# Patient Record
Sex: Female | Born: 1955
Health system: Southern US, Community
[De-identification: ages and names within clinical notes are randomized; demographics above are authoritative.]

## PROBLEM LIST (undated history)

## (undated) DIAGNOSIS — E669 Obesity, unspecified: Secondary | ICD-10-CM

## (undated) DIAGNOSIS — F319 Bipolar disorder, unspecified: Secondary | ICD-10-CM

## (undated) DIAGNOSIS — G8921 Chronic pain due to trauma: Secondary | ICD-10-CM

## (undated) HISTORY — DX: Obesity, unspecified: E66.9

## (undated) HISTORY — DX: Bipolar disorder, unspecified: F31.9

## (undated) HISTORY — DX: Chronic pain due to trauma: G89.21

---

## 2001-02-14 ENCOUNTER — Encounter: Admission: RE | Admit: 2001-02-14 | Discharge: 2001-02-14 | Payer: Self-pay | Admitting: Family Medicine

## 2004-12-23 ENCOUNTER — Emergency Department (HOSPITAL_COMMUNITY): Admission: EM | Admit: 2004-12-23 | Discharge: 2004-12-23 | Payer: Self-pay | Admitting: Emergency Medicine

## 2005-04-11 ENCOUNTER — Encounter (INDEPENDENT_AMBULATORY_CARE_PROVIDER_SITE_OTHER): Payer: Self-pay | Admitting: *Deleted

## 2005-04-11 LAB — CONVERTED CEMR LAB

## 2005-05-06 ENCOUNTER — Ambulatory Visit: Payer: Self-pay | Admitting: Family Medicine

## 2005-07-05 ENCOUNTER — Encounter: Admission: RE | Admit: 2005-07-05 | Discharge: 2005-07-05 | Payer: Self-pay

## 2005-08-23 ENCOUNTER — Emergency Department (HOSPITAL_COMMUNITY): Admission: EM | Admit: 2005-08-23 | Discharge: 2005-08-23 | Payer: Self-pay | Admitting: Emergency Medicine

## 2007-02-08 DIAGNOSIS — F319 Bipolar disorder, unspecified: Secondary | ICD-10-CM | POA: Insufficient documentation

## 2007-02-09 ENCOUNTER — Encounter (INDEPENDENT_AMBULATORY_CARE_PROVIDER_SITE_OTHER): Payer: Self-pay | Admitting: *Deleted

## 2007-05-22 ENCOUNTER — Ambulatory Visit: Payer: Self-pay | Admitting: Family Medicine

## 2007-05-22 ENCOUNTER — Encounter (INDEPENDENT_AMBULATORY_CARE_PROVIDER_SITE_OTHER): Payer: Self-pay | Admitting: Family Medicine

## 2007-05-22 DIAGNOSIS — E669 Obesity, unspecified: Secondary | ICD-10-CM | POA: Insufficient documentation

## 2007-05-22 LAB — CONVERTED CEMR LAB

## 2007-05-28 ENCOUNTER — Encounter (INDEPENDENT_AMBULATORY_CARE_PROVIDER_SITE_OTHER): Payer: Self-pay | Admitting: Family Medicine

## 2007-06-20 ENCOUNTER — Ambulatory Visit: Payer: Self-pay | Admitting: Family Medicine

## 2007-06-20 ENCOUNTER — Encounter (INDEPENDENT_AMBULATORY_CARE_PROVIDER_SITE_OTHER): Payer: Self-pay | Admitting: Family Medicine

## 2007-06-20 LAB — CONVERTED CEMR LAB
ALT: 18 units/L (ref 0–35)
AST: 16 units/L (ref 0–37)
Albumin: 4 g/dL (ref 3.5–5.2)
BUN: 12 mg/dL (ref 6–23)
CO2: 24 meq/L (ref 19–32)
Calcium: 9.1 mg/dL (ref 8.4–10.5)
Chloride: 109 meq/L (ref 96–112)
Cholesterol: 167 mg/dL (ref 0–200)
Potassium: 4.4 meq/L (ref 3.5–5.3)

## 2007-06-21 ENCOUNTER — Encounter (INDEPENDENT_AMBULATORY_CARE_PROVIDER_SITE_OTHER): Payer: Self-pay | Admitting: Family Medicine

## 2007-07-02 ENCOUNTER — Ambulatory Visit: Payer: Self-pay | Admitting: Family Medicine

## 2007-07-02 ENCOUNTER — Telehealth (INDEPENDENT_AMBULATORY_CARE_PROVIDER_SITE_OTHER): Payer: Self-pay | Admitting: *Deleted

## 2007-07-02 DIAGNOSIS — H6120 Impacted cerumen, unspecified ear: Secondary | ICD-10-CM

## 2008-09-15 ENCOUNTER — Encounter (INDEPENDENT_AMBULATORY_CARE_PROVIDER_SITE_OTHER): Payer: Self-pay | Admitting: Family Medicine

## 2009-11-26 ENCOUNTER — Ambulatory Visit: Payer: Self-pay | Admitting: Family Medicine

## 2009-11-26 ENCOUNTER — Encounter: Payer: Self-pay | Admitting: Family Medicine

## 2009-11-26 DIAGNOSIS — R5383 Other fatigue: Secondary | ICD-10-CM

## 2009-11-26 DIAGNOSIS — R5381 Other malaise: Secondary | ICD-10-CM | POA: Insufficient documentation

## 2009-11-26 DIAGNOSIS — G8921 Chronic pain due to trauma: Secondary | ICD-10-CM | POA: Insufficient documentation

## 2009-11-27 ENCOUNTER — Telehealth: Payer: Self-pay | Admitting: *Deleted

## 2009-11-27 LAB — CONVERTED CEMR LAB
BUN: 16 mg/dL (ref 6–23)
CO2: 18 meq/L — ABNORMAL LOW (ref 19–32)
Creatinine, Ser: 0.79 mg/dL (ref 0.40–1.20)
Glucose, Bld: 105 mg/dL — ABNORMAL HIGH (ref 70–99)
HCT: 39.1 % (ref 36.0–46.0)
Hemoglobin: 12.7 g/dL (ref 12.0–15.0)
MCV: 84.3 fL (ref 78.0–100.0)
RBC: 4.64 M/uL (ref 3.87–5.11)
TSH: 1.405 microintl units/mL (ref 0.350–4.500)
Total Bilirubin: 0.4 mg/dL (ref 0.3–1.2)
Total Protein: 7.4 g/dL (ref 6.0–8.3)
Vit D, 25-Hydroxy: 13 ng/mL — ABNORMAL LOW (ref 30–89)
WBC: 9.2 10*3/uL (ref 4.0–10.5)

## 2009-12-07 ENCOUNTER — Encounter: Payer: Self-pay | Admitting: Family Medicine

## 2009-12-14 ENCOUNTER — Encounter: Payer: Self-pay | Admitting: Family Medicine

## 2009-12-14 ENCOUNTER — Ambulatory Visit: Payer: Self-pay | Admitting: Family Medicine

## 2009-12-14 LAB — CONVERTED CEMR LAB
Cholesterol: 176 mg/dL (ref 0–200)
HDL: 52 mg/dL (ref >39)
Triglycerides: 57 mg/dL (ref <150)

## 2009-12-17 ENCOUNTER — Encounter: Payer: Self-pay | Admitting: Family Medicine

## 2010-02-20 ENCOUNTER — Emergency Department (HOSPITAL_COMMUNITY): Admission: EM | Admit: 2010-02-20 | Discharge: 2010-02-20 | Payer: Self-pay | Admitting: Emergency Medicine

## 2011-01-11 NOTE — Letter (Signed)
Summary: Lipid Letter  [facility]  [address]   [place], [place] [address]   Phone: [phone]  Fax: [phone]    12/17/2009  Wendy Ruiz [address] [place], [place]  [address]  Dear [name]:  We have carefully reviewed your last lipid profile from 12/14/2009 and the results are noted below with a summary of recommendations for lipid management.    Cholesterol:       176     Goal: < 200     HDL "good" Cholesterol:   52     Goal: > 40   LDL "bad" Cholesterol:   113     Goal: < 100   Triglycerides:       57     Goal: < 150  ** The only number that is not ideal is the LDL. You could either work on your diet and exercise to improve this or we could start a medicine. I am fine either way. Give me a call at [phone] to let me know what you think.     TLC Diet (Therapeutic Lifestyle Change): Saturated Fats & Transfatty acids should be kept < 7% of total calories ***Reduce Saturated Fats Polyunstaurated Fat can be up to 10% of total calories Monounsaturated Fat Fat can be up to 20% of total calories Total Fat should be no greater than 25-35% of total calories Carbohydrates should be 50-60% of total calories Protein should be approximately 15% of total calories Fiber should be at least 20-30 grams a day ***Increased fiber may help lower LDL Total Cholesterol should be < 200mg /day Consider adding plant stanol/sterols to diet (example: Benacol spread) ***A higher intake of unsaturated fat may reduce Triglycerides and Increase HDL    Adjunctive Measures (may lower LIPIDS and reduce risk of Heart Attack) include: Aerobic Exercise (20-30 minutes 3-4 times a week) Limit Alcohol Consumption Weight Reduction Aspirin 75-81 mg a day by mouth (if not allergic or contraindicated) Dietary Fiber 20-30 grams a day by mouth     Current Medications: 1)    Tramadol Hcl 50 Mg Tabs (Tramadol hcl) .Marland Kitchen.. 1-2 by mouth every 6 hours as needed for pain 2)    Vitamin D  (ergocalciferol) 50000 Unit Caps (Ergocalciferol) .... One pill once a week for 8 weeks  If you have any questions, please call. We appreciate being able to work with you.   Sincerely,    [facility] Myrtie Soman  MD  Appended Document: Lipid Letter mailed.

## 2011-01-25 ENCOUNTER — Encounter: Payer: Self-pay | Admitting: *Deleted

## 2011-06-03 ENCOUNTER — Ambulatory Visit (INDEPENDENT_AMBULATORY_CARE_PROVIDER_SITE_OTHER): Payer: Medicare Other | Admitting: Family Medicine

## 2011-06-03 VITALS — BP 116/83 | HR 73 | Temp 98.2°F | Ht 65.0 in | Wt 229.0 lb

## 2011-06-03 DIAGNOSIS — L738 Other specified follicular disorders: Secondary | ICD-10-CM

## 2011-06-03 DIAGNOSIS — L739 Follicular disorder, unspecified: Secondary | ICD-10-CM | POA: Insufficient documentation

## 2011-06-03 DIAGNOSIS — L678 Other hair color and hair shaft abnormalities: Secondary | ICD-10-CM

## 2011-06-03 MED ORDER — MUPIROCIN CALCIUM 2 % EX CREA: TOPICAL_CREAM | CUTANEOUS | Status: DC

## 2011-06-03 NOTE — Patient Instructions (Signed)
Folliculitis  (Inflammation of Hair Follicle) Folliculitis is an infection and inflammation of the hair follicles. Hair follicles become red and irritated. This inflammation is usually caused by bacteria. The bacteria thrive in warm, moist environments. This condition can be seen anywhere on the body.  CAUSES AND RISK FACTORS The most common cause of folliculitis is an infection by germs (bacteria). Fungal and viral infections can also cause the condition. Viral infections may be more common in people whose bodies are unable to fight disease well (weakened immune systems). Examples include people with:  AIDS.   An organ transplant.   Cancer.  People with depressed immune systems, diabetes, or obesity, have a greater risk of getting folliculitis than the general population. Certain chemicals, especially oils and tars, also can cause folliculitis. SYMPTOMS  An early sign of folliculitis is a small, white or yellow pus-filled, itchy lesion (pustule). These lesions appear on a red, inflamed follicle. They are usually less than 5 mm (.20 inches).   The most likely starting points are the scalp, thighs, legs, back and buttocks. Folliculitis is also frequently found in areas of repeated shaving.   When an infection of the follicle goes deeper, it becomes a boil or furuncle. A group of closely packed boils create a larger lesion (a carbuncle). These sores (lesions) tend to occur in hairy, sweaty areas of the body.  TREATMENT  A doctor who specializes in skin problems (dermatologists) treats mild cases of folliculitis with antiseptic washes.   They also use a skin application which kills germs (topical antibiotics). Tea tree oil is a good topical antiseptic as well. It can be found at a health food store. A small percentage of individuals may develop an allergy to the tea tree oil.   Mild to moderate boils respond well to warm water compresses applied three times daily.   In some cases, oral  antibiotics should be taken with the skin treatment.   If lesions contain large quantities of pus or fluid, your caregiver may drain them. This allows the topical antibiotics to get to the affected areas better.   Stubborn cases of folliculitis may respond to laser hair removal. This process uses a high intensity light beam (a laser) to destroy the follicle and reduces the scarring from folliculitis. After laser hair removal, hair will no longer grow in the laser treated area.  Patients with long-lasting folliculitis need to find out where the infection is coming from. Germs can live in the nostrils of the patient. This can trigger an outbreak now and then. Sometimes the bacteria live in the nostrils of a family member. This person does not develop the disorder but they repeatedly re-expose others to the germ. To break the cycle of recurrence in the patient, the family member must also undergo treatment. PREVENTION  Individuals who are predisposed to folliculitis should be extremely careful about personal hygiene.   Application of antiseptic washes may help prevent recurrences.   A topical antibiotic cream, mupirocin (Bactroban), has been effective at reducing bacteria in the nostrils. It is applied inside the nose with your little finger. This is done twice daily for a week. Then it is repeated every 6 months.   Because follicle disorders tend to come back, patients must receive follow-up care. Your caregiver may be able to recognize a recurrence before it becomes severe.  SEEK IMMEDIATE MEDICAL CARE IF:  You develop redness, swelling, or increasing pain in the area.   An unexplained oral temperature above 100.4 develops.  You are not improving with treatment or are getting worse.   You have any other questions or concerns.  Document Released: 02/06/2002 Document Re-Released: 02/22/2010 [facility] Patient Information 2011 [place], [place].

## 2011-06-06 ENCOUNTER — Encounter: Payer: Self-pay | Admitting: Family Medicine

## 2011-06-06 NOTE — Progress Notes (Signed)
  Subjective:    Patient ID: Wendy Ruiz, female    DOB: 09/22/1956, 55 y.o.   MRN: [id number]  HPI  1. "Bump:" On head, x several days, thought that she may have lice last week - used OTC treatment x 2. Hx dry scalp. Now, feels sore bump on crown/top of head. No drainage. No fever/chills, N/V/D, other sores/rash. No change in hair products or regimen otherwise. Never happened before.  Review of Systems SEE HPI.    Objective:   Physical Exam  Vitals reviewed. Constitutional: She appears well-developed and well-nourished. No distress.  HENT:       < 1 cm diameter pustular lesion though seems to have ruptured already, no signs of infection, no warmth or drainage. Otherwise, scalp generally dry.      Assessment & Plan:

## 2011-06-06 NOTE — Assessment & Plan Note (Signed)
Rx Mupirocin to area. Advised to limit hair regimen to sensitive products. Precautions given.

## 2011-07-03 ENCOUNTER — Emergency Department (HOSPITAL_COMMUNITY)
Admission: EM | Admit: 2011-07-03 | Discharge: 2011-07-03 | Discharge status: Against Medical Advice | Payer: Medicare Other | Attending: Emergency Medicine | Admitting: Emergency Medicine

## 2011-07-04 ENCOUNTER — Ambulatory Visit (INDEPENDENT_AMBULATORY_CARE_PROVIDER_SITE_OTHER): Payer: Medicare Other | Admitting: Family Medicine

## 2011-07-04 DIAGNOSIS — L738 Other specified follicular disorders: Secondary | ICD-10-CM

## 2011-07-04 DIAGNOSIS — L739 Follicular disorder, unspecified: Secondary | ICD-10-CM

## 2011-07-04 MED ORDER — DOXYCYCLINE HYCLATE 100 MG PO TABS: 100.0000 mg | ORAL_TABLET | Freq: Two times a day (BID) | ORAL | Status: AC

## 2011-07-04 NOTE — Patient Instructions (Signed)
Go home and wash the area with soap and water. Keep using the cream three times a day on the area for next 7 days. I'll send in an antibiotic to use in the AM and PM for 7 days.  Take with food.   If it does come back, you can use the cream.  If it ruptures, make sure to use soap and water on the area.

## 2011-07-04 NOTE — Assessment & Plan Note (Signed)
See patient instructions. Basically a "dirty wound" with debris, dried pus still present.  Tried to clean as much as I could and was able to visualize ruptured area, but much debris still left in her hair. She needs to clean area.  Treat with Abx to prevent re-infection.  No current abscess or cellulitis.

## 2011-07-04 NOTE — Progress Notes (Signed)
  Subjective:    Patient ID: Wendy Ruiz, female    DOB: Jan 05, 1956, 55 y.o.   MRN: [id number]  HPI 1.  "Bump on head":  Patient seen end of June for large furuncle on scalp.  Prescribed Mupirocin.  Initially improved, returned same spot about 1 week ago.  Pain in area.  Burst on Friday evening, went to [facility] to make sure everything was okay Sat evening but long wait deterred her from being seen.  Only minimal tenderness there currently.  Has not washed her hair or used Mupirocin since Friday.  No fevers, chills, abd pain.   No other skin lesions noted   Review of Systems See HPI above for review of systems.       Objective:   Physical Exam Gen:  Alert, cooperative patient who appears stated age in no acute distress.  Vital signs reviewed. Skin:  Ruptured furuncle noted on scalp near vertex.  Pus, coagulate, and scalp debris noted in skin about 1 cm diameter out from rupture.  Cleaned with Qtips and alcohol swab.  Some matter remained matted to her hair after cleaning.  No skin erythema noted surrounding area       Assessment & Plan:

## 2011-11-14 ENCOUNTER — Ambulatory Visit: Payer: Medicare Other | Admitting: Family Medicine

## 2011-11-15 ENCOUNTER — Ambulatory Visit (INDEPENDENT_AMBULATORY_CARE_PROVIDER_SITE_OTHER): Payer: Medicare Other | Admitting: Family Medicine

## 2011-11-15 ENCOUNTER — Encounter: Payer: Self-pay | Admitting: Family Medicine

## 2011-11-15 VITALS — BP 122/81 | HR 59 | Temp 98.1°F | Ht 64.0 in | Wt 228.0 lb

## 2011-11-15 DIAGNOSIS — R05 Cough: Secondary | ICD-10-CM

## 2011-11-15 MED ORDER — FLUTICASONE PROPIONATE 50 MCG/ACT NA SUSP: 1.0000 | Freq: Every day | NASAL | Status: DC

## 2011-11-15 MED ORDER — OMEPRAZOLE 20 MG PO CPDR: 20.0000 mg | DELAYED_RELEASE_CAPSULE | Freq: Every day | ORAL | Status: DC

## 2011-11-15 NOTE — Progress Notes (Signed)
Wendy Ruiz presents to clinic today for evaluation of chronic cough. She has had a nonproductive cough for the last 3 months. Cough will come and go and does not seem to be associated with any one thing. She notes occasional globus sensation of food getting stuck but denies any actual dysphagia or obstruction. She denies burning reflux type symptoms or sour taste in her mouth in the morning. She denies any nasal discharge allergies has no pets. Also denies any wheezing or past diagnosis of asthma.  PMH reviewed.  ROS as above otherwise neg Medications reviewed. Current Outpatient Prescriptions  Medication Sig Dispense Refill  . fluticasone (FLONASE) 50 MCG/ACT nasal spray Place 1 spray into the nose daily.  16 g  2  . omeprazole (PRILOSEC) 20 MG capsule Take 1 capsule (20 mg total) by mouth daily.  30 capsule  2    Exam:  BP 122/81  Pulse 59  Temp(Src) 98.1 F (36.7 C) (Oral)  Ht 5\' 4"  (1.626 m)  Wt 228 lb (103.42 kg)  BMI 39.14 kg/m2 Gen: Well NAD HEENT: EOMI,  MMM, no neck masses bilaterally normal some cobblestoning noted on the posterior  Pharyngeal area appear Lungs: CTABL Nl WOB Heart: RRR no MRG Abd: NABS, NT, ND Exts: Non edematous BL  LE, warm and well perfused.

## 2011-11-15 NOTE — Assessment & Plan Note (Signed)
Chronic cough of undetermined etiology at this point. I favor a diagnosis of reflux versus posterior nasal drip versus cough variant Asthma. Plan to treat first 2 with omeprazole and Flonase. Will follow up with PCP in 2 months or sooner if worsening.

## 2011-11-15 NOTE — Patient Instructions (Signed)
Thank you for coming in today. Use the flonase nasal spray daily. Take the omeprazole daily.  See Dr. Madolyn Frieze in 2 months.   Cough, Adult  A cough is a reflex that helps clear your throat and airways. It can help heal the body or may be a reaction to an irritated airway. A cough may only last 2 or 3 weeks (acute) or may last more than 8 weeks (chronic).   CAUSES Acute cough:  Viral or bacterial infections.  Chronic cough:  Infections.     Allergies.    Asthma.    Post-nasal drip.     Smoking.    Heartburn or acid reflux.     Some medicines.     Chronic lung problems (COPD).     Cancer.  SYMPTOMS   Cough.     Fever.    Chest pain.     Increased breathing rate.     High-pitched whistling sound when breathing (wheezing).     Colored mucus that you cough up (sputum).  TREATMENT    A bacterial cough may be treated with antibiotic medicine.     A viral cough must run its course and will not respond to antibiotics.     Your caregiver may recommend other treatments if you have a chronic cough.  HOME CARE INSTRUCTIONS    Only take over-the-counter or prescription medicines for pain, discomfort, or fever as directed by your caregiver. Use cough suppressants only as directed by your caregiver.     Use a cold steam vaporizer or humidifier in your bedroom or home to help loosen secretions.     Sleep in a semi-upright position if your cough is worse at night.     Rest as needed.     Stop smoking if you smoke.  SEEK IMMEDIATE MEDICAL CARE IF:    You have pus in your sputum.     Your cough starts to worsen.     You cannot control your cough with suppressants and are losing sleep.     You begin coughing up blood.     You have difficulty breathing.     You develop pain which is getting worse or is uncontrolled with medicine.     You have a fever.  MAKE SURE YOU:    Understand these instructions.     Will watch your condition.     Will get help right  away if you are not doing well or get worse.  Document Released: 05/27/2011 Document Revised: 08/10/2011 Document Reviewed: 05/27/2011 [facility] Patient Information 2012 [place], [place].

## 2012-07-15 ENCOUNTER — Emergency Department (HOSPITAL_COMMUNITY)
Admission: EM | Admit: 2012-07-15 | Discharge: 2012-07-15 | Disposition: A | Discharge status: Home / Self Care | Payer: Medicare Other | Attending: Emergency Medicine | Admitting: Emergency Medicine

## 2012-07-15 ENCOUNTER — Encounter (HOSPITAL_COMMUNITY): Payer: Self-pay | Admitting: Emergency Medicine

## 2012-07-15 DIAGNOSIS — L219 Seborrheic dermatitis, unspecified: Secondary | ICD-10-CM

## 2012-07-15 DIAGNOSIS — Z87891 Personal history of nicotine dependence: Secondary | ICD-10-CM | POA: Insufficient documentation

## 2012-07-15 MED ORDER — KETOCONAZOLE 2 % EX SHAM: MEDICATED_SHAMPOO | CUTANEOUS | Status: AC

## 2012-07-15 NOTE — ED Notes (Signed)
Pt c/o scalp, pain and itching x 2 months intermittent. 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

## 2012-07-16 NOTE — ED Provider Notes (Signed)
Medical screening examination/treatment/procedure(s) were performed by non-physician practitioner and as supervising physician I was immediately available for consultation/collaboration.  Doug Sou, MD 07/16/12 [phone]

## 2012-07-16 NOTE — ED Provider Notes (Signed)
History     CSN: [id number]  Arrival date & time 07/15/12  1910   First MD Initiated Contact with Patient 07/15/12 2225      Chief Complaint  Patient presents with  . Hair/Scalp Problem    (Consider location/radiation/quality/duration/timing/severity/associated sxs/prior treatment) HPI Comments: Patient comes in today with a chief complaint of itching and irritation of her scalp intermittently over the past 2 months.  She has also noticed some dandruff.  She denies using any new shampoos, hair products, or hair dye.  She has not tried any treatment prior to arrival in the ED.  No fevers or chills.  She has not noticed any lesions on her scalp.    The history is provided by the patient.    Past Medical History  Diagnosis Date  . Bipolar 1 disorder   . 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 Obesity     History reviewed. No pertinent past surgical history.  No family history on file.  History  Substance Use Topics  . Smoking status: Former Games developer  . Smokeless tobacco: Not on file  . Alcohol Use: No    OB History    Grav Para Term Preterm Abortions TAB SAB Ect Mult Living                  Review of Systems  Constitutional: Negative for fever and chills.  Gastrointestinal: Negative for nausea and vomiting.  Skin: Negative for color change and rash.  Neurological: Negative for headaches.    Allergies  Review of patient's allergies indicates no known allergies.  Home Medications   Current Outpatient Rx  Name Route Sig Dispense Refill  . KETOCONAZOLE 2 % EX SHAM Topical Apply topically 2 (two) times a week. 120 mL 0    BP 121/70  Pulse 77  Temp 97.8 F (36.6 C) (Oral)  Resp 18  Ht 5\' 4"  (1.626 m)  Wt 210 lb (95.255 kg)  BMI 36.05 kg/m2  SpO2 95%  Physical Exam  Nursing note and vitals reviewed. Constitutional: She appears well-developed and well-nourished. No distress.  HENT:  Head: Normocephalic and atraumatic.  Neck: Normal range of motion. Neck supple.    Cardiovascular: Normal rate, regular rhythm and normal heart sounds.   Pulmonary/Chest: Effort normal and breath sounds normal.  Neurological: She is alert.  Skin: Skin is warm and dry. No rash noted. She is not diaphoretic.       Dandruff of the scalp.  No erythema or lesions.    Psychiatric: She has a normal mood and affect.    ED Course  Procedures (including critical care time)  Labs Reviewed - No data to display No results found.   1. Seborrheic dermatitis       MDM  Patient with what appears to be very mild form of Seborrheic Dermatitis.  Some dandruff, but no lesions observed.  Patient given Rx for Ketoconazole shampoo and instructed to follow up with PCP.        Pascal Lux [place], PA-C 07/16/12 1436

## 2013-05-09 ENCOUNTER — Emergency Department (HOSPITAL_COMMUNITY)
Admission: EM | Admit: 2013-05-09 | Discharge: 2013-05-09 | Disposition: A | Discharge status: Home / Self Care | Payer: Medicare Other | Attending: Emergency Medicine | Admitting: Emergency Medicine

## 2013-05-09 ENCOUNTER — Emergency Department (HOSPITAL_COMMUNITY): Payer: Medicare Other

## 2013-05-09 ENCOUNTER — Encounter (HOSPITAL_COMMUNITY): Payer: Self-pay | Admitting: Emergency Medicine

## 2013-05-09 DIAGNOSIS — K5900 Constipation, unspecified: Secondary | ICD-10-CM | POA: Insufficient documentation

## 2013-05-09 DIAGNOSIS — G8929 Other chronic pain: Secondary | ICD-10-CM | POA: Insufficient documentation

## 2013-05-09 DIAGNOSIS — R350 Frequency of micturition: Secondary | ICD-10-CM | POA: Insufficient documentation

## 2013-05-09 DIAGNOSIS — E669 Obesity, unspecified: Secondary | ICD-10-CM | POA: Insufficient documentation

## 2013-05-09 DIAGNOSIS — R109 Unspecified abdominal pain: Secondary | ICD-10-CM | POA: Insufficient documentation

## 2013-05-09 DIAGNOSIS — M549 Dorsalgia, unspecified: Secondary | ICD-10-CM | POA: Insufficient documentation

## 2013-05-09 DIAGNOSIS — Z880 Allergy status to penicillin: Secondary | ICD-10-CM | POA: Insufficient documentation

## 2013-05-09 DIAGNOSIS — F319 Bipolar disorder, unspecified: Secondary | ICD-10-CM | POA: Insufficient documentation

## 2013-05-09 DIAGNOSIS — Z87891 Personal history of nicotine dependence: Secondary | ICD-10-CM | POA: Insufficient documentation

## 2013-05-09 DIAGNOSIS — K8020 Calculus of gallbladder without cholecystitis without obstruction: Secondary | ICD-10-CM | POA: Insufficient documentation

## 2013-05-09 LAB — URINALYSIS, ROUTINE W REFLEX MICROSCOPIC
Glucose, UA: NEGATIVE mg/dL
Ketones, ur: NEGATIVE mg/dL
Leukocytes, UA: NEGATIVE
Nitrite: NEGATIVE
Specific Gravity, Urine: 1.019 (ref 1.005–1.030)
pH: 5 (ref 5.0–8.0)

## 2013-05-09 LAB — COMPREHENSIVE METABOLIC PANEL
ALT: 17 U/L (ref 0–35)
CO2: 24 mEq/L (ref 19–32)
Calcium: 9.4 mg/dL (ref 8.4–10.5)
GFR calc Af Amer: >90 mL/min (ref >90)
GFR calc non Af Amer: >90 mL/min (ref >90)
Glucose, Bld: 125 mg/dL — ABNORMAL HIGH (ref 70–99)
Sodium: 139 mEq/L (ref 135–145)

## 2013-05-09 LAB — CBC WITH DIFFERENTIAL/PLATELET
Basophils Relative: 0 % (ref 0–1)
HCT: 36.8 % (ref 36.0–46.0)
Hemoglobin: 11.6 g/dL — ABNORMAL LOW (ref 12.0–15.0)
Lymphocytes Relative: 21 % (ref 12–46)
Lymphs Abs: 1.6 10*3/uL (ref 0.7–4.0)
Monocytes Absolute: 0.3 10*3/uL (ref 0.1–1.0)
Monocytes Relative: 4 % (ref 3–12)
Neutro Abs: 5.7 10*3/uL (ref 1.7–7.7)
Neutrophils Relative %: 74 % (ref 43–77)
RBC: 4.42 MIL/uL (ref 3.87–5.11)

## 2013-05-09 LAB — LIPASE, BLOOD: Lipase: 20 U/L (ref 11–59)

## 2013-05-09 MED ORDER — ONDANSETRON HCL 4 MG/2ML IJ SOLN: 4.0000 mg | Freq: Once | INTRAMUSCULAR | Status: DC | PRN

## 2013-05-09 MED ORDER — HYDROMORPHONE HCL PF 1 MG/ML IJ SOLN: 1.0000 mg | INTRAMUSCULAR | Status: DC | PRN

## 2013-05-09 MED ORDER — IOHEXOL 300 MG/ML  SOLN
100.0000 mL | Freq: Once | INTRAMUSCULAR | Status: AC | PRN
  Administered 2013-05-09: 100 mL via INTRAVENOUS

## 2013-05-09 MED ORDER — IOHEXOL 300 MG/ML  SOLN
50.0000 mL | Freq: Once | INTRAMUSCULAR | Status: AC | PRN
  Administered 2013-05-09: 50 mL via ORAL

## 2013-05-09 MED ORDER — ONDANSETRON 8 MG PO TBDP: 8.0000 mg | ORAL_TABLET | Freq: Three times a day (TID) | ORAL | Status: DC | PRN

## 2013-05-09 MED ORDER — SODIUM CHLORIDE 0.9 % IV SOLN
1000.0000 mL | Freq: Once | INTRAVENOUS | Status: AC
  Administered 2013-05-09: 1000 mL via INTRAVENOUS

## 2013-05-09 MED ORDER — HYDROCODONE-ACETAMINOPHEN 5-325 MG PO TABS: 1.0000 | ORAL_TABLET | Freq: Four times a day (QID) | ORAL | Status: DC | PRN

## 2013-05-09 MED ORDER — SODIUM CHLORIDE 0.9 % IV SOLN: 1000.0000 mL | INTRAVENOUS | Status: DC

## 2013-05-09 NOTE — ED Notes (Signed)
Pt states that on Tuesday she started having RLQ pain that she thought was gas. Yesterday she did enema and laxatives and still hasnt had any stool. Pt LBM was on Saturday which is normal for her.  Pt states RLQ pain radiates around her lower back.  Pt denies any problems with urination.

## 2013-05-09 NOTE — ED Notes (Signed)
MD at bedside. 

## 2013-05-09 NOTE — ED Provider Notes (Signed)
History   CSN: [id number] Arrival date & time 05/09/13  0748First MD Initiated Contact with Patient 05/09/13 0755      Chief Complaint  Patient presents with  . Abdominal Pain    right lower    Patient is a 57 y.o. female presenting with abdominal pain. The history is provided by the patient.  Abdominal Pain This is a new problem. The current episode started yesterday. The problem occurs constantly. The problem has been gradually worsening. Associated symptoms include abdominal pain. The symptoms are aggravated by walking (movement, walking straight up). Nothing relieves the symptoms. Treatments tried: tried laxatives and enema. The treatment provided no relief.  Appetite fine.  She initially thought it was gas. The pain does radiate to her back.   Past Medical History  Diagnosis Date  . Bipolar 1 disorder   . 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 Obesity     History reviewed. No pertinent past surgical history.  No family history on file.  History  Substance Use Topics  . Smoking status: Former Games developer  . Smokeless tobacco: Not on file  . Alcohol Use: No    OB History   Grav Para Term Preterm Abortions TAB SAB Ect Mult Living                  Review of Systems  Constitutional: Negative for fever.  Gastrointestinal: Positive for abdominal pain and constipation. Negative for vomiting and diarrhea.  Genitourinary: Positive for frequency. Negative for dysuria.  Musculoskeletal: Positive for back pain.    Allergies  Penicillins  Home Medications   Current Outpatient Rx  Name  Route  Sig  Dispense  Refill  . acetaminophen (TYLENOL) 500 MG tablet   Oral   Take 500 mg by mouth every 6 (six) hours as needed for pain.         Marland Kitchen HYDROcodone-acetaminophen (NORCO) 5-325 MG per tablet   Oral   Take 1-2 tablets by mouth every 6 (six) hours as needed for pain.   16 tablet   0   . ondansetron (ZOFRAN ODT) 8 MG disintegrating tablet   Oral   Take 1 tablet (8 mg total) by mouth  every 8 (eight) hours as needed for nausea.   20 tablet   0     BP 149/75  Pulse 63  Temp(Src) 98 F (36.7 C) (Oral)  Resp 18  SpO2 100%  Physical Exam  Nursing note and vitals reviewed. Constitutional: She appears well-developed and well-nourished. No distress.  HENT:  Head: Normocephalic and atraumatic.  Right Ear: External ear normal.  Left Ear: External ear normal.  Eyes: Conjunctivae are normal. Right eye exhibits no discharge. Left eye exhibits no discharge. No scleral icterus.  Neck: Neck supple. No tracheal deviation present.  Cardiovascular: Normal rate, regular rhythm and intact distal pulses.   Pulmonary/Chest: Effort normal and breath sounds normal. No stridor. No respiratory distress. She has no wheezes. She has no rales.  Abdominal: Soft. Bowel sounds are normal. She exhibits no distension. There is no tenderness. There is no rebound, no guarding, no tenderness at McBurney's point and negative Murphy's sign.  ? Mild ttp ruq  Musculoskeletal: She exhibits no edema and no tenderness.  Neurological: She is alert. She has normal strength. No sensory deficit. Cranial nerve deficit:  no gross defecits noted. She exhibits normal muscle tone. She displays no seizure activity. Coordination normal.  Skin: Skin is warm and dry. No rash noted.  Psychiatric: She has a normal mood  and affect.    ED Course  Procedures (including critical care time)  Labs Reviewed  COMPREHENSIVE METABOLIC PANEL - Abnormal; Notable for the following:    Glucose, Bld 125 (*)    Albumin 3.3 (*)    All other components within normal limits  CBC WITH DIFFERENTIAL - Abnormal; Notable for the following:    Hemoglobin 11.6 (*)    All other components within normal limits  LIPASE, BLOOD  URINALYSIS, ROUTINE W REFLEX MICROSCOPIC   Ct Abdomen Pelvis W Contrast  05/09/2013   *RADIOLOGY REPORT*  Clinical Data: Abdominal pain.  Constipation.  CT ABDOMEN AND PELVIS WITH CONTRAST  Technique:   Multidetector CT imaging of the abdomen and pelvis was performed following the standard protocol during bolus administration of intravenous contrast.  Contrast: 50mL OMNIPAQUE IOHEXOL 300 MG/ML  SOLN, OMNIPAQUE IOHEXOL 300 MG/ML  SOLN  Comparison: No priors.  Findings:  Lung Bases: 1.5 x 1.1 cm ground-glass attenuation nodule in the left lower lobe (image 5 of series 4).  This may have some central cavitation, or a small focus of associated bronchiectasis, best demonstrated on image 6 of series 4.  3.5 x 1.7 cm soft tissue nodule in the lateral aspect of the left breast is nonspecific in appearance  (Image 3 of series 2).  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 Numerous gallstones with central cavitation within the gallbladder. No current findings to suggest acute cholecystitis at this time. The appearance of the pancreas, spleen, bilateral adrenal glands and bilateral kidneys is unremarkable.  Normal appendix.  No significant volume of ascites, no pneumoperitoneum and no pathologic distension of small bowel.  No definite pathologic lymphadenopathy identified within the abdomen or pelvis.  Uterus and left ovary is unremarkable in appearance.  2.5 cm low attenuation lesion in the right ovary likely represents a small cyst.  Numerous pelvic phleboliths.  Urinary bladder is unremarkable in appearance.  Musculoskeletal: There are no aggressive appearing lytic or blastic lesions noted in the visualized portions of the skeleton.  IMPRESSION: 1.  No acute findings in the abdomen or pelvis to account for the patient's symptoms. 2.  Specifically, the appendix is normal. 3.  2.5 cm low attenuation right ovarian lesion likely represents a small right ovarian cyst. 4.  Cholelithiasis without findings to  suggest acute cholecystitis at this time. 5.  1.5 x 1.1 cm ground-glass attenuation nodules in the left lower lobe.  There may be some central cavitation or focal associated bronchiectasis.  This is nonspecific, and could certainly be of infectious or inflammatory etiology, however, a small neoplasm is difficult to exclude. Initial follow-up by chest CT without contrast is recommended in 3 months to confirm persistence.   This recommendation follows the consensus statement: Recommendations for the Management of Subsolid Pulmonary Nodules Detected at CT:  A Statement from the Fleischner Society as published in Radiology 2013; 266:304-317. 6.  3.5 x 1.7 cm nonspecific soft tissue attenuation nodule in the lateral aspect of the left breast.  Mammographic correlation is recommended. 7.  5.1 x 3.2 cm lesion in the right lobe of the liver has imaging characteristics compatible with a cavernous hemangioma.   Original Report Authenticated By: [name] Reed, M.D.     1. Flank pain   2. Gallstones  MDM  Flank pain. The patient's laboratory testing and CT scan are reassuring. She does have a few incidental findings noted on the CT scan but I do not feel that they are contributing factors to her pain today. Specifically, she does have gallstones but has not had any issues with her appetite. She has no relation of the pain to meals and eating. I doubt she is having biliary colic. However, I will give her an outpatient referral to general surgery for another opinion.  I Did discuss incidental lung and breast nodules with the patient. She will follow up with her primary doctor to discuss further evaluation and compareto her prior outpatient mammography. i did explain to her that a repeat CT scan was recommended in 3 months regarding the lung nodule.        Celene Kras, MD 05/09/13 [phone]

## 2013-05-14 ENCOUNTER — Ambulatory Visit (INDEPENDENT_AMBULATORY_CARE_PROVIDER_SITE_OTHER): Payer: Medicare Other | Admitting: Family Medicine

## 2013-05-14 ENCOUNTER — Encounter: Payer: Self-pay | Admitting: Family Medicine

## 2013-05-14 VITALS — BP 136/75 | HR 91 | Temp 99.8°F | Ht 64.0 in | Wt 226.0 lb

## 2013-05-14 DIAGNOSIS — R109 Unspecified abdominal pain: Secondary | ICD-10-CM | POA: Insufficient documentation

## 2013-05-14 MED ORDER — OMEPRAZOLE 20 MG PO CPDR: 20.0000 mg | DELAYED_RELEASE_CAPSULE | Freq: Every day | ORAL | Status: DC

## 2013-05-14 MED ORDER — POLYETHYLENE GLYCOL 3350 17 GM/SCOOP PO POWD: 17.0000 g | Freq: Two times a day (BID) | ORAL | Status: DC | PRN

## 2013-05-14 NOTE — Patient Instructions (Addendum)
Constipation diet (handout below)  Miralax (laxative) as needed.  Follow-up in 2-4 weeks for your physical  Please call [facility] to schedule a mammogram [phone].   Repeat CT-chest in 3 months to follow-up on that spot in your lung.  September 2014    Constipation, Adult Constipation is when a person has fewer than 3 bowel movements a week; has difficulty having a bowel movement; or has stools that are dry, hard, or larger than normal. As people grow older, constipation is more common. If you try to fix constipation with medicines that make you have a bowel movement (laxatives), the problem may get worse. Long-term laxative use may cause the muscles of the colon to become weak. A low-fiber diet, not taking in enough fluids, and taking certain medicines may make constipation worse. CAUSES   Certain medicines, such as antidepressants, pain medicine, iron supplements, antacids, and water pills.   Certain diseases, such as diabetes, irritable bowel syndrome (IBS), thyroid disease, or depression.   Not drinking enough water.   Not eating enough fiber-rich foods.   Stress or travel.  Lack of physical activity or exercise.  Not going to the restroom when there is the urge to have a bowel movement.  Ignoring the urge to have a bowel movement.  Using laxatives too much. SYMPTOMS   Having fewer than 3 bowel movements a week.   Straining to have a bowel movement.   Having hard, dry, or larger than normal stools.   Feeling full or bloated.   Pain in the lower abdomen.  Not feeling relief after having a bowel movement. DIAGNOSIS  Your caregiver will take a medical history and perform a physical exam. Further testing may be done for severe constipation. Some tests may include:   A barium enema X-ray to examine your rectum, colon, and sometimes, your small intestine.  A sigmoidoscopy to examine your lower colon.  A colonoscopy to examine your entire  colon. TREATMENT  Treatment will depend on the severity of your constipation and what is causing it. Some dietary treatments include drinking more fluids and eating more fiber-rich foods. Lifestyle treatments may include regular exercise. If these diet and lifestyle recommendations do not help, your caregiver may recommend taking over-the-counter laxative medicines to help you have bowel movements. Prescription medicines may be prescribed if over-the-counter medicines do not work.  HOME CARE INSTRUCTIONS   Increase dietary fiber in your diet, such as fruits, vegetables, whole grains, and beans. Limit high-fat and processed sugars in your diet, such as [place] fries, hamburgers, cookies, candies, and soda.   A fiber supplement may be added to your diet if you cannot get enough fiber from foods.   Drink enough fluids to keep your urine clear or pale yellow.   Exercise regularly or as directed by your caregiver.   Go to the restroom when you have the urge to go. Do not hold it.  Only take medicines as directed by your caregiver. Do not take other medicines for constipation without talking to your caregiver first. SEEK IMMEDIATE MEDICAL CARE IF:   You have bright red blood in your stool.   Your constipation lasts for more than 4 days or gets worse.   You have abdominal or rectal pain.   You have thin, pencil-like stools.  You have unexplained weight loss. MAKE SURE YOU:   Understand these instructions.  Will watch your condition.  Will get help right away if you are not doing well or get worse. Document Released: 08/26/2004  Document Revised: 02/20/2012 Document Reviewed: 11/01/2011 [facility] Patient Information 2014 [place], [place].

## 2013-05-14 NOTE — Progress Notes (Signed)
  Subjective:    Patient ID: Wendy Ruiz, female    DOB: [birth year], 57 y.o.   MRN: [id number]  HPI # ED follow-up for abdominal pain Seen 05/29. Labs and CT no significant abnormalities.   She got a laxative and it helps her pain Pain: right flank with radiation down thigh The pain is improved but she gets intermittent warmth/pressure/soreness on her right flank and thigh Pain now? Soreness on her side  Exacerbated by: movement (getting out of the bed), sometimes when she eats or drinks something  Alleviated by: Tylenol (4 05/29, 2 daily daily other days)  History of back problems   Review of Systems Per HPI Denies burning with urination  Denies nausea Denies constipation   Allergies, medication, past medical history reviewed.  Smoking status noted.  Not sexually active     Objective:   Physical Exam GEN: NAD; well-nourished, obese PSYCH: pleasant CV: RRR PULM: NI WOB ABD: soft, NT, ND, obese BACK: no tenderness; no CVA tenderness EXT: warm, dry, no edema GAIT: normal     Assessment & Plan:

## 2013-05-14 NOTE — Assessment & Plan Note (Signed)
Seen in ED 05/29 due to right flank and thigh pain.  It has improved with laxative. She has underlying back pain (chronic) as well, but this does not seem to be an issue at this time.  Incidentally, cholelithiasis, cyst on ovaries, nodule on lungs, nodule on liver. Normal CMET, CBC.  -Monitor symptoms; continue Tylenol prn which provides significant relief -Constipation: handout on diet and lifestyle given; Miralax prn  -Follow-up if pain worsens -Repeat CT-chest in 3 months to follow-up incidental lung nodule (September 2014)

## 2013-06-21 ENCOUNTER — Other Ambulatory Visit: Payer: Self-pay

## 2013-06-21 DIAGNOSIS — Z1231 Encounter for screening mammogram for malignant neoplasm of breast: Secondary | ICD-10-CM

## 2013-07-01 ENCOUNTER — Encounter (HOSPITAL_COMMUNITY): Payer: Self-pay

## 2013-07-01 ENCOUNTER — Emergency Department (HOSPITAL_COMMUNITY)
Admission: EM | Admit: 2013-07-01 | Discharge: 2013-07-01 | Disposition: A | Discharge status: Home / Self Care | Payer: Medicare Other | Source: Home / Self Care

## 2013-07-01 DIAGNOSIS — H6120 Impacted cerumen, unspecified ear: Secondary | ICD-10-CM

## 2013-07-01 DIAGNOSIS — H6123 Impacted cerumen, bilateral: Secondary | ICD-10-CM

## 2013-07-01 NOTE — ED Notes (Signed)
Pain in both ears

## 2013-07-01 NOTE — ED Provider Notes (Signed)
Wendy Ruiz is a 57 y.o. female who presents to Urgent Care today for bilateral ear pain. Patient noted pressure and some pain in both ears starting Thursday. She also notes increased wax.  She denies any fevers or chills. She has a history of BL cerumen impaction in the past before. She has note tried any medications yet.    PMH reviewed. Healthy   History  Substance Use Topics  . Smoking status: Former Games developer  . Smokeless tobacco: Not on file  . Alcohol Use: No   ROS as above Medications reviewed. No current facility-administered medications for this encounter.   Current Outpatient Prescriptions  Medication Sig Dispense Refill  . acetaminophen (TYLENOL) 500 MG tablet Take 500 mg by mouth every 6 (six) hours as needed for pain.      Marland Kitchen HYDROcodone-acetaminophen (NORCO) 5-325 MG per tablet Take 1-2 tablets by mouth every 6 (six) hours as needed for pain.  16 tablet  0  . omeprazole (PRILOSEC) 20 MG capsule Take 1 capsule (20 mg total) by mouth daily.  30 capsule  3  . ondansetron (ZOFRAN ODT) 8 MG disintegrating tablet Take 1 tablet (8 mg total) by mouth every 8 (eight) hours as needed for nausea.  20 tablet  0  . polyethylene glycol powder (GLYCOLAX/MIRALAX) powder Take 17 g by mouth 2 (two) times daily as needed.  3350 g  1    Exam:  BP 102/67  Pulse 66  Temp(Src) 97.9 F (36.6 C) (Oral)  Resp 16  SpO2 98% Gen: Well NAD HEENT: EOMI,  MMM, right canal nearly occluded with wax. TM white. Left TM completely occluded with cerumen.  Lungs: CTABL Nl WOB Heart: RRR no MRG Abd: NABS, NT, ND Exts: Non edematous BL  LE, warm and well perfused.   Following irrigation the TMs BL were normal appearing and the patient noted resolution of her symptoms.   No results found for this or any previous visit (from the past 24 hour(s)). No results found.  Assessment and Plan: 57 y.o. female with BL cerumen impaction.  Relieved with irrigation.  Plan: f/u with PCP. Refer to ENT PRN.   Handout provided.      Rodolph Bong, MD 07/01/13 [phone]

## 2013-07-15 ENCOUNTER — Ambulatory Visit: Payer: Medicare Other

## 2013-07-15 ENCOUNTER — Ambulatory Visit
Admission: RE | Admit: 2013-07-15 | Discharge: 2013-07-15 | Disposition: A | Discharge status: Home / Self Care | Payer: Medicare Other | Source: Ambulatory Visit

## 2013-07-15 DIAGNOSIS — Z1231 Encounter for screening mammogram for malignant neoplasm of breast: Secondary | ICD-10-CM

## 2013-07-18 ENCOUNTER — Other Ambulatory Visit: Payer: Self-pay | Admitting: Family Medicine

## 2013-07-18 DIAGNOSIS — R928 Other abnormal and inconclusive findings on diagnostic imaging of breast: Secondary | ICD-10-CM

## 2013-07-31 ENCOUNTER — Other Ambulatory Visit: Payer: Self-pay | Admitting: Family Medicine

## 2013-07-31 DIAGNOSIS — R928 Other abnormal and inconclusive findings on diagnostic imaging of breast: Secondary | ICD-10-CM

## 2013-08-05 ENCOUNTER — Other Ambulatory Visit: Payer: Medicare Other

## 2013-08-26 ENCOUNTER — Ambulatory Visit
Admission: RE | Admit: 2013-08-26 | Discharge: 2013-08-26 | Disposition: A | Discharge status: Home / Self Care | Payer: Medicare Other | Source: Ambulatory Visit | Attending: Family Medicine | Admitting: Family Medicine

## 2013-08-26 DIAGNOSIS — R928 Other abnormal and inconclusive findings on diagnostic imaging of breast: Secondary | ICD-10-CM

## 2014-07-22 ENCOUNTER — Encounter (HOSPITAL_COMMUNITY): Payer: Self-pay | Admitting: Emergency Medicine

## 2014-07-22 ENCOUNTER — Emergency Department (INDEPENDENT_AMBULATORY_CARE_PROVIDER_SITE_OTHER)
Admission: EM | Admit: 2014-07-22 | Discharge: 2014-07-22 | Disposition: A | Discharge status: Home / Self Care | Payer: Medicare HMO | Source: Home / Self Care | Attending: Family Medicine | Admitting: Family Medicine

## 2014-07-22 ENCOUNTER — Emergency Department (INDEPENDENT_AMBULATORY_CARE_PROVIDER_SITE_OTHER): Payer: Medicare HMO

## 2014-07-22 DIAGNOSIS — R059 Cough, unspecified: Secondary | ICD-10-CM

## 2014-07-22 DIAGNOSIS — R053 Chronic cough: Secondary | ICD-10-CM

## 2014-07-22 DIAGNOSIS — R05 Cough: Secondary | ICD-10-CM

## 2014-07-22 LAB — POCT RAPID STREP A: Streptococcus, Group A Screen (Direct): NEGATIVE

## 2014-07-22 MED ORDER — OMEPRAZOLE 40 MG PO CPDR: 40.0000 mg | DELAYED_RELEASE_CAPSULE | Freq: Every day | ORAL | Status: DC

## 2014-07-22 MED ORDER — FLUTICASONE PROPIONATE 50 MCG/ACT NA SUSP: 2.0000 | Freq: Two times a day (BID) | NASAL | Status: DC

## 2014-07-22 NOTE — ED Provider Notes (Signed)
Medical screening examination/treatment/procedure(s) were performed by a resident physician or non-physician practitioner and as the supervising physician I was immediately available for consultation/collaboration.  Linna Darner, MD Family Medicine   Waldemar Dickens, MD 07/22/14 [phone]

## 2014-07-22 NOTE — Discharge Instructions (Signed)
Cough, Adult  A cough is a reflex that helps clear your throat and airways. It can help heal the body or may be a reaction to an irritated airway. A cough may only last 2 or 3 weeks (acute) or may last more than 8 weeks (chronic).  CAUSES Acute cough:  Viral or bacterial infections. Chronic cough:  Infections.  Allergies.  Asthma.  Post-nasal drip.  Smoking.  Heartburn or acid reflux.  Some medicines.  Chronic lung problems (COPD).  Cancer. SYMPTOMS   Cough.  Fever.  Chest pain.  Increased breathing rate.  High-pitched whistling sound when breathing (wheezing).  Colored mucus that you cough up (sputum). TREATMENT   A bacterial cough may be treated with antibiotic medicine.  A viral cough must run its course and will not respond to antibiotics.  Your caregiver may recommend other treatments if you have a chronic cough. HOME CARE INSTRUCTIONS   Only take over-the-counter or prescription medicines for pain, discomfort, or fever as directed by your caregiver. Use cough suppressants only as directed by your caregiver.  Use a cold steam vaporizer or humidifier in your bedroom or home to help loosen secretions.  Sleep in a semi-upright position if your cough is worse at night.  Rest as needed.  Stop smoking if you smoke. SEEK IMMEDIATE MEDICAL CARE IF:   You have pus in your sputum.  Your cough starts to worsen.  You cannot control your cough with suppressants and are losing sleep.  You begin coughing up blood.  You have difficulty breathing.  You develop pain which is getting worse or is uncontrolled with medicine.  You have a fever. MAKE SURE YOU:   Understand these instructions.  Will watch your condition.  Will get help right away if you are not doing well or get worse. Document Released: 05/27/2011 Document Revised: 02/20/2012 Document Reviewed: 05/27/2011 [employer] Patient Information 2015 [employer]. This information is not intended  to replace advice given to you by your health care provider. Make sure you discuss any questions you have with your health care provider.  

## 2014-07-22 NOTE — ED Notes (Signed)
C/o non productive cough States she does have bilateral ears impaction States she feels as her head has pressure

## 2014-07-22 NOTE — ED Provider Notes (Signed)
CSN: [id number]     Arrival date & time 07/22/14  1714 History   First MD Initiated Contact with Patient 07/22/14 1756     Chief Complaint  Patient presents with  . Cough   (Consider location/radiation/quality/duration/timing/severity/associated sxs/prior Treatment) HPI Comments: 58 year old female presents complaining of chronic cough. She says that she has had a cough frequently for about 2-1/2 years. She was seen here about a year and a half ago, she was prescribed omeprazole and Flonase to treat for either reflux or postnasal drip, she didn't take either one, the cough did get slightly better over the next 2 months but returned about a year ago and has been getting worse in the past 2 weeks. She has a constant tickle sensation in the back of her throat and top of her chest that causes her to cough. The cough is worse at night and in the morning. She denies chest pain or shortness of breath, or hemoptysis. No fever or weight loss. She does not smoke but she used to.   Past Medical History  Diagnosis Date  . Bipolar 1 disorder   . 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 Obesity    History reviewed. No pertinent past surgical history. History reviewed. No pertinent family history. History  Substance Use Topics  . Smoking status: Former Research scientist (life sciences)  . Smokeless tobacco: Not on file  . Alcohol Use: No   OB History   Grav Para Term Preterm Abortions TAB SAB Ect Mult Living                 Review of Systems  HENT:       Tickle in throat  Respiratory: Positive for cough.   All other systems reviewed and are negative.   Allergies  Penicillins  Home Medications   Prior to Admission medications   Medication Sig Start Date End Date Taking? Authorizing Provider  acetaminophen (TYLENOL) 500 MG tablet Take 500 mg by mouth every 6 (six) hours as needed for pain.    Historical Provider, MD  fluticasone (FLONASE) 50 MCG/ACT nasal spray Place 2 sprays into both nostrils 2 (two) times daily. Decrease  to 2 sprays/nostril daily after 5 days 07/22/14   Liam Graham, PA-C  HYDROcodone-acetaminophen (NORCO) 5-325 MG per tablet Take 1-2 tablets by mouth every 6 (six) hours as needed for pain. 05/09/13   Dorie Rank, MD  omeprazole (PRILOSEC) 20 MG capsule Take 1 capsule (20 mg total) by mouth daily. 05/14/13   Carolin Guernsey, MD  omeprazole (PRILOSEC) 40 MG capsule Take 1 capsule (40 mg total) by mouth daily. 07/22/14   Freeman Caldron Rafik Koppel, PA-C  ondansetron (ZOFRAN ODT) 8 MG disintegrating tablet Take 1 tablet (8 mg total) by mouth every 8 (eight) hours as needed for nausea. 05/09/13   Dorie Rank, MD  polyethylene glycol powder (GLYCOLAX/MIRALAX) powder Take 17 g by mouth 2 (two) times daily as needed. 05/14/13   Carolin Guernsey, MD   BP 143/96  Pulse 80  Temp(Src) 99.7 F (37.6 C) (Oral)  Resp 18  SpO2 98% Physical Exam  Nursing note and vitals reviewed. Constitutional: She is oriented to person, place, and time. Vital signs are normal. She appears well-developed and well-nourished. No distress.  HENT:  Head: Normocephalic and atraumatic.  Right Ear: Tympanic membrane, external ear and ear canal normal.  Left Ear: Tympanic membrane and external ear normal.  Nose: Nose normal. Right sinus exhibits no maxillary sinus tenderness and no frontal sinus  tenderness. Left sinus exhibits no maxillary sinus tenderness and no frontal sinus tenderness.  Mouth/Throat: Uvula is midline, oropharynx is clear and moist and mucous membranes are normal. No oropharyngeal exudate or posterior oropharyngeal erythema.  Left canal impacted with cerumen  Eyes: Conjunctivae are normal. Right eye exhibits no discharge. Left eye exhibits no discharge.  Neck: Normal range of motion. Neck supple.  Cardiovascular: Normal rate, regular rhythm and normal heart sounds.   Pulmonary/Chest: Effort normal and breath sounds normal. No respiratory distress. She has no wheezes. She has no rales.  Abdominal: Soft. She exhibits no mass.  There is no tenderness.  Neurological: She is alert and oriented to person, place, and time. She has normal strength. Coordination normal.  Skin: Skin is warm and dry. No rash noted. She is not diaphoretic.  Psychiatric: She has a normal mood and affect. Judgment normal.    ED Course  Procedures (including critical care time) Labs Review Labs Reviewed  CULTURE, GROUP A STREP  POCT RAPID STREP A ([place])    Imaging Review Dg Chest 2 View  07/22/2014   CLINICAL DATA:  COUGH  EXAM: CHEST  2 VIEW  COMPARISON:  None.  FINDINGS: The cardiac and mediastinal silhouettes are within normal limits.  The lungs are normally inflated. No airspace consolidation, pleural effusion, or pulmonary edema is identified. There is no pneumothorax.  No acute osseous abnormality identified.  IMPRESSION: No active cardiopulmonary disease.   Electronically Signed   By: Jeannine Boga M.D.   On: 07/22/2014 18:33     MDM   1. Chronic cough    The patient says she got better with the dentist or getting worse a year ago. Incidentally, review of the medical record shows that she was prescribed a PPI for something else about 15 months ago. We'll treat with a PPI as well as Flonase for any postnasal drip/chronic rhinitis. She gets better, she will continue these for 3 months and followup with primary care. If she does not improve within 1 month, she will followup with ENT.  Meds ordered this encounter  Medications  . omeprazole (PRILOSEC) 40 MG capsule    Sig: Take 1 capsule (40 mg total) by mouth daily.    Dispense:  30 capsule    Refill:  2    Order Specific Question:  Supervising Provider    Answer:  Lynne Leader, [place]  . fluticasone (FLONASE) 50 MCG/ACT nasal spray    Sig: Place 2 sprays into both nostrils 2 (two) times daily. Decrease to 2 sprays/nostril daily after 5 days    Dispense:  16 g    Refill:  2    Order Specific Question:  Supervising Provider    Answer:  Lynne Leader, [place]        Liam Graham, PA-C 07/22/14 [place]

## 2014-07-25 LAB — CULTURE, GROUP A STREP

## 2015-01-06 ENCOUNTER — Other Ambulatory Visit: Payer: Self-pay | Admitting: Family Medicine

## 2015-01-06 DIAGNOSIS — K219 Gastro-esophageal reflux disease without esophagitis: Secondary | ICD-10-CM

## 2015-01-06 MED ORDER — OMEPRAZOLE 20 MG PO CPDR: 20.0000 mg | DELAYED_RELEASE_CAPSULE | Freq: Every day | ORAL | Status: DC

## 2015-01-06 NOTE — Telephone Encounter (Signed)
Pt called and needs a refill on her Prilosec 40 mg called in. jw

## 2015-04-20 ENCOUNTER — Emergency Department (INDEPENDENT_AMBULATORY_CARE_PROVIDER_SITE_OTHER)
Admission: EM | Admit: 2015-04-20 | Discharge: 2015-04-20 | Disposition: A | Discharge status: Home / Self Care | Payer: Medicare HMO | Source: Home / Self Care | Attending: Family Medicine | Admitting: Family Medicine

## 2015-04-20 ENCOUNTER — Emergency Department (INDEPENDENT_AMBULATORY_CARE_PROVIDER_SITE_OTHER): Payer: Medicare HMO

## 2015-04-20 ENCOUNTER — Encounter (HOSPITAL_COMMUNITY): Payer: Self-pay | Admitting: *Deleted

## 2015-04-20 DIAGNOSIS — J40 Bronchitis, not specified as acute or chronic: Secondary | ICD-10-CM | POA: Diagnosis not present

## 2015-04-20 DIAGNOSIS — J069 Acute upper respiratory infection, unspecified: Secondary | ICD-10-CM | POA: Diagnosis not present

## 2015-04-20 LAB — POCT RAPID STREP A: Streptococcus, Group A Screen (Direct): NEGATIVE

## 2015-04-20 MED ORDER — PREDNISONE 10 MG PO TABS: ORAL_TABLET | ORAL | Status: DC

## 2015-04-20 MED ORDER — BENZONATATE 100 MG PO CAPS: 100.0000 mg | ORAL_CAPSULE | Freq: Three times a day (TID) | ORAL | Status: DC

## 2015-04-20 MED ORDER — LEVOCETIRIZINE DIHYDROCHLORIDE 5 MG PO TABS: 5.0000 mg | ORAL_TABLET | Freq: Every evening | ORAL | Status: DC

## 2015-04-20 MED ORDER — ALBUTEROL SULFATE HFA 108 (90 BASE) MCG/ACT IN AERS: 1.0000 | INHALATION_SPRAY | Freq: Four times a day (QID) | RESPIRATORY_TRACT | Status: DC | PRN

## 2015-04-20 NOTE — Discharge Instructions (Signed)
Rapid strep negative Chest xray normal I suspect you are dealing with persistent inflammation of your lungs from recent common cold compounded by seasonal allergies. Please use medications as directed and follow up with your doctor if you have no improvement.  Upper Respiratory Infection, Adult An upper respiratory infection (URI) is also sometimes known as the common cold. The upper respiratory tract includes the nose, sinuses, throat, trachea, and bronchi. Bronchi are the airways leading to the lungs. Most people improve within 1 week, but symptoms can last up to 2 weeks. A residual cough may last even longer.  CAUSES Many different viruses can infect the tissues lining the upper respiratory tract. The tissues become irritated and inflamed and often become very moist. Mucus production is also common. A cold is contagious. You can easily spread the virus to others by oral contact. This includes kissing, sharing a glass, coughing, or sneezing. Touching your mouth or nose and then touching a surface, which is then touched by another person, can also spread the virus. SYMPTOMS  Symptoms typically develop 1 to 3 days after you come in contact with a cold virus. Symptoms vary from person to person. They may include:  Runny nose.  Sneezing.  Nasal congestion.  Sinus irritation.  Sore throat.  Loss of voice (laryngitis).  Cough.  Fatigue.  Muscle aches.  Loss of appetite.  Headache.  Low-grade fever. DIAGNOSIS  You might diagnose your own cold based on familiar symptoms, since most people get a cold 2 to 3 times a year. Your caregiver can confirm this based on your exam. Most importantly, your caregiver can check that your symptoms are not due to another disease such as strep throat, sinusitis, pneumonia, asthma, or epiglottitis. Blood tests, throat tests, and X-rays are not necessary to diagnose a common cold, but they may sometimes be helpful in excluding other more serious diseases.  Your caregiver will decide if any further tests are required. RISKS AND COMPLICATIONS  You may be at risk for a more severe case of the common cold if you smoke cigarettes, have chronic heart disease (such as heart failure) or lung disease (such as asthma), or if you have a weakened immune system. The very young and very old are also at risk for more serious infections. Bacterial sinusitis, middle ear infections, and bacterial pneumonia can complicate the common cold. The common cold can worsen asthma and chronic obstructive pulmonary disease (COPD). Sometimes, these complications can require emergency medical care and may be life-threatening. PREVENTION  The best way to protect against getting a cold is to practice good hygiene. Avoid oral or hand contact with people with cold symptoms. Wash your hands often if contact occurs. There is no clear evidence that vitamin C, vitamin E, echinacea, or exercise reduces the chance of developing a cold. However, it is always recommended to get plenty of rest and practice good nutrition. TREATMENT  Treatment is directed at relieving symptoms. There is no cure. Antibiotics are not effective, because the infection is caused by a virus, not by bacteria. Treatment may include:  Increased fluid intake. Sports drinks offer valuable electrolytes, sugars, and fluids.  Breathing heated mist or steam (vaporizer or shower).  Eating chicken soup or other clear broths, and maintaining good nutrition.  Getting plenty of rest.  Using gargles or lozenges for comfort.  Controlling fevers with ibuprofen or acetaminophen as directed by your caregiver.  Increasing usage of your inhaler if you have asthma. Zinc gel and zinc lozenges, taken in the first  24 hours of the common cold, can shorten the duration and lessen the severity of symptoms. Pain medicines may help with fever, muscle aches, and throat pain. A variety of non-prescription medicines are available to treat  congestion and runny nose. Your caregiver can make recommendations and may suggest nasal or lung inhalers for other symptoms.  HOME CARE INSTRUCTIONS   Only take over-the-counter or prescription medicines for pain, discomfort, or fever as directed by your caregiver.  Use a warm mist humidifier or inhale steam from a shower to increase air moisture. This may keep secretions moist and make it easier to breathe.  Drink enough water and fluids to keep your urine clear or pale yellow.  Rest as needed.  Return to work when your temperature has returned to normal or as your caregiver advises. You may need to stay home longer to avoid infecting others. You can also use a face mask and careful hand washing to prevent spread of the virus. SEEK MEDICAL CARE IF:   After the first few days, you feel you are getting worse rather than better.  You need your caregiver's advice about medicines to control symptoms.  You develop chills, worsening shortness of breath, or brown or red sputum. These may be signs of pneumonia.  You develop yellow or brown nasal discharge or pain in the face, especially when you bend forward. These may be signs of sinusitis.  You develop a fever, swollen neck glands, pain with swallowing, or white areas in the back of your throat. These may be signs of strep throat. SEEK IMMEDIATE MEDICAL CARE IF:   You have a fever.  You develop severe or persistent headache, ear pain, sinus pain, or chest pain.  You develop wheezing, a prolonged cough, cough up blood, or have a change in your usual mucus (if you have chronic lung disease).  You develop sore muscles or a stiff neck. Document Released: 05/24/2001 Document Revised: 02/20/2012 Document Reviewed: 03/05/2014 [facility] Patient Information 2015 [place], [place]. This information is not intended to replace advice given to you by your health care provider. Make sure you discuss any questions you have with your health care  provider.

## 2015-04-20 NOTE — ED Notes (Signed)
Pt  Reports    Symptoms  Of    Coughing            As   Well   As   sorethroat      With  Onset  Of  Symptoms  X  10  Days           Pt  Reports  The  Symptoms  X  10  Days

## 2015-04-20 NOTE — ED Provider Notes (Signed)
CSN: [id number]     Arrival date & time 04/20/15  0802 History   First MD Initiated Contact with Patient 04/20/15 [phone]     Chief Complaint  Patient presents with  . 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 female presenting with URI. The history is provided by the patient.  URI Presenting symptoms: congestion, cough, rhinorrhea and sore throat   Presenting symptoms: no ear pain, no facial pain, no fatigue and no fever   Severity:  Moderate Onset quality:  Gradual Duration:  2 weeks Timing:  Constant Progression:  Waxing and waning Chronicity:  New Associated symptoms: myalgias and sneezing     Past Medical History  Diagnosis Date  . Bipolar 1 disorder   . 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 Obesity    History reviewed. No pertinent past surgical history. History reviewed. No pertinent family history. History  Substance Use Topics  . Smoking status: Former Research scientist (life sciences)  . Smokeless tobacco: Not on file  . Alcohol Use: No   OB History    No data available     Review of Systems  Constitutional: Negative for fever and fatigue.  HENT: Positive for congestion, postnasal drip, rhinorrhea, sneezing and sore throat. Negative for ear pain.   Eyes: Negative.   Respiratory: Positive for cough.   Cardiovascular: Negative.   Gastrointestinal: Negative.   Musculoskeletal: Positive for myalgias.  Skin: Negative.     Allergies  Penicillins  Home Medications   Prior to Admission medications   Medication Sig Start Date End Date Taking? Authorizing Provider  acetaminophen (TYLENOL) 500 MG tablet Take 500 mg by mouth every 6 (six) hours as needed for pain.    Historical Provider, MD  albuterol (PROVENTIL HFA;VENTOLIN HFA) 108 (90 BASE) MCG/ACT inhaler Inhale 1-2 puffs into the lungs every 6 (six) hours as needed for wheezing or shortness of breath. Or  persistent coughing 04/20/15   Audelia Hives Jahziel Sinn, PA  benzonatate (TESSALON) 100 MG capsule Take 1 capsule (100 mg total) by mouth every 8 (eight) hours. 04/20/15   Audelia Hives Woodrow Drab, PA  fluticasone (FLONASE) 50 MCG/ACT nasal spray Place 2 sprays into both nostrils 2 (two) times daily. Decrease to 2 sprays/nostril daily after 5 days 07/22/14   Liam Graham, PA-C  HYDROcodone-acetaminophen (NORCO) 5-325 MG per tablet Take 1-2 tablets by mouth every 6 (six) hours as needed for pain. 05/09/13   Dorie Rank, MD  levocetirizine (XYZAL) 5 MG tablet Take 1 tablet (5 mg total) by mouth every evening. 04/20/15   Audelia Hives Teona Vargus, PA  omeprazole (PRILOSEC) 20 MG capsule Take 1 capsule (20 mg total) by mouth daily. 01/06/15   Leone Brand, MD  omeprazole (PRILOSEC) 40 MG capsule Take 1 capsule (40 mg total) by mouth daily. 07/22/14   Freeman Caldron Baker, PA-C  ondansetron (ZOFRAN ODT) 8 MG disintegrating tablet Take 1 tablet (8 mg total) by mouth every 8 (eight) hours as needed for nausea. 05/09/13   Dorie Rank, MD  polyethylene glycol powder (GLYCOLAX/MIRALAX) powder Take 17 g by mouth 2 (two) times daily as needed. 05/14/13   Carolin Guernsey, MD  predniSONE (DELTASONE) 10 MG tablet Take 4 po QD day 1, 3 po QD day 2, 2 po QD day 3, 1 po QD day 4 then stop 04/20/15   Audelia Hives Antoino Westhoff, PA   BP 122/77 mmHg  Pulse 78  Temp(Src) 99.2 F (37.3 C) (Oral)  Resp 14  SpO2 98% Physical Exam  Constitutional: She is oriented to person, place, and time. She appears well-developed and well-nourished. No distress.  HENT:  Head: Normocephalic and atraumatic.  Right Ear: Hearing, tympanic membrane, external ear and ear canal normal.  Left Ear: Hearing, tympanic membrane, external ear and ear canal normal.  Nose: Nose normal.  Mouth/Throat: Uvula is midline, oropharynx is clear and moist and mucous membranes are normal.  Eyes: Conjunctivae are normal. No scleral icterus.  Neck: Normal range of motion. Neck  supple.  Cardiovascular: Normal rate, regular rhythm and normal heart sounds.   Pulmonary/Chest: Effort normal and breath sounds normal. No respiratory distress. She has no wheezes.  Musculoskeletal: Normal range of motion.  Lymphadenopathy:    She has no cervical adenopathy.  Neurological: She is alert and oriented to person, place, and time.  Skin: Skin is warm and dry.  Psychiatric: She has a normal mood and affect. Her behavior is normal.  Nursing note and vitals reviewed.   ED Course  Procedures (including critical care time) Labs Review Labs Reviewed  POCT RAPID STREP A ([facility] ONLY)    Imaging Review Dg Chest 2 View  04/20/2015   CLINICAL DATA:  Cough for 2 weeks  EXAM: CHEST  2 VIEW  COMPARISON:  07/22/2014  FINDINGS: Stable mild scarring near the right minor fissure. There is no edema, consolidation, effusion, or pneumothorax. Normal heart size and mediastinal contours. No acute osseous findings.  IMPRESSION: No active cardiopulmonary disease.   Electronically Signed   By: Monte Fantasia M.D.   On: 04/20/2015 08:51     MDM   1. URI (upper respiratory infection)   2. Bronchitis    Rapid strep negative CXR unremarkable Suspect persistent pulmonary inflammation (bronchitis) as a result of recent URI. Afebrile and without hypoxia Will treat with cough suppressant, oral steroid taper, bronchodilator and oral antihistamine. Will advise PCP follow up if no improvement.     [place], [place] 04/20/15 [phone]

## 2015-04-22 LAB — CULTURE, GROUP A STREP

## 2015-04-22 MED ORDER — CLINDAMYCIN HCL 300 MG PO CAPS: 300.0000 mg | ORAL_CAPSULE | Freq: Three times a day (TID) | ORAL | Status: DC

## 2015-04-22 NOTE — Progress Notes (Signed)
04/22/2015: throat culture Beta-hemolytic colonies, not group A Streptococcus isolated. Patient allergic to PCN. Electronic Rx sent to patient's pharmacy of record. RN to contact patient to notify of results and Rx.

## 2015-04-23 NOTE — Progress Notes (Signed)
04/23/2015: Patient called clinic to states that she did not want to pay for clindamycin that had recently been prescribed for strep pharyngitis. This medication appears to have been chosen because patient lists an allergy to PCN on her chart. Patient now states to pharmacist at [facility] on [address] that she IS NOT allergic to penicillins. Patient states she cannot explain why PCN is listed as an allergy in her EPIC/ASAP chart.  She would like a new, more affordable prescription. Verbal instructions provided to pharmacist that patient may take PCN VK 500mg  po TID x 10 days (#30) as long as she feels certain that she is not allergic to PCN.

## 2017-05-24 ENCOUNTER — Ambulatory Visit (INDEPENDENT_AMBULATORY_CARE_PROVIDER_SITE_OTHER): Payer: Self-pay

## 2017-05-24 ENCOUNTER — Encounter (HOSPITAL_COMMUNITY): Payer: Self-pay | Admitting: Emergency Medicine

## 2017-05-24 ENCOUNTER — Ambulatory Visit (HOSPITAL_COMMUNITY)
Admission: EM | Admit: 2017-05-24 | Discharge: 2017-05-24 | Disposition: A | Discharge status: Home / Self Care | Payer: Medicare Other | Attending: Family Medicine | Admitting: Family Medicine

## 2017-05-24 DIAGNOSIS — J40 Bronchitis, not specified as acute or chronic: Secondary | ICD-10-CM

## 2017-05-24 DIAGNOSIS — R05 Cough: Secondary | ICD-10-CM

## 2017-05-24 DIAGNOSIS — R0602 Shortness of breath: Secondary | ICD-10-CM

## 2017-05-24 MED ORDER — ALBUTEROL SULFATE HFA 108 (90 BASE) MCG/ACT IN AERS: 1.0000 | INHALATION_SPRAY | Freq: Four times a day (QID) | RESPIRATORY_TRACT | 0 refills | Status: DC | PRN

## 2017-05-24 MED ORDER — AZITHROMYCIN 250 MG PO TABS: 250.0000 mg | ORAL_TABLET | Freq: Every day | ORAL | 0 refills | Status: DC

## 2017-05-24 MED ORDER — IPRATROPIUM-ALBUTEROL 0.5-2.5 (3) MG/3ML IN SOLN
RESPIRATORY_TRACT | Status: AC
  Filled 2017-05-24: qty 3

## 2017-05-24 MED ORDER — DEXAMETHASONE SODIUM PHOSPHATE 10 MG/ML IJ SOLN: 10.0000 mg | Freq: Once | INTRAMUSCULAR | Status: DC

## 2017-05-24 MED ORDER — DEXAMETHASONE SODIUM PHOSPHATE 10 MG/ML IJ SOLN
10.0000 mg | Freq: Once | INTRAMUSCULAR | Status: AC
  Administered 2017-05-24: 10 mg via INTRAMUSCULAR

## 2017-05-24 MED ORDER — PREDNISONE 50 MG PO TABS: ORAL_TABLET | ORAL | 0 refills | Status: DC

## 2017-05-24 MED ORDER — DEXAMETHASONE SODIUM PHOSPHATE 10 MG/ML IJ SOLN
INTRAMUSCULAR | Status: AC
  Filled 2017-05-24: qty 1

## 2017-05-24 MED ORDER — BENZONATATE 100 MG PO CAPS: 100.0000 mg | ORAL_CAPSULE | Freq: Three times a day (TID) | ORAL | 0 refills | Status: DC

## 2017-05-24 MED ORDER — IPRATROPIUM-ALBUTEROL 0.5-2.5 (3) MG/3ML IN SOLN
3.0000 mL | Freq: Once | RESPIRATORY_TRACT | Status: AC
  Administered 2017-05-24: 3 mL via RESPIRATORY_TRACT

## 2017-05-24 NOTE — ED Triage Notes (Signed)
The patient presented to the [facility] with a complaint of a cough and congestion x 1 week.

## 2017-05-24 NOTE — ED Provider Notes (Signed)
CSN: [id number]     Arrival date & time 05/24/17  1608 History   First MD Initiated Contact with Patient 05/24/17 1654     Chief Complaint  Patient presents with  . Cough   (Consider location/radiation/quality/duration/timing/severity/associated sxs/prior Treatment) The history is provided by the patient.  Cough  Cough characteristics:  Non-productive, dry, hacking and harsh Sputum characteristics:  Clear Severity:  Severe Onset quality:  Gradual Duration:  1 week Timing:  Constant Progression:  Worsening Chronicity:  New Smoker: no   Context: upper respiratory infection   Context: not smoke exposure   Relieved by:  Nothing Worsened by:  Nothing Ineffective treatments:  Decongestant and cough suppressants Associated symptoms: shortness of breath and wheezing   Associated symptoms: no chest pain, no chills, no fever, no rash, no rhinorrhea and no sore throat   Risk factors: no recent travel     Past Medical History:  Diagnosis Date  . Bipolar 1 disorder ([place])   . 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 Obesity    History reviewed. No pertinent surgical history. History reviewed. No pertinent family history. Social History  Substance Use Topics  . Smoking status: Former Research scientist (life sciences)  . Smokeless tobacco: Not on file  . Alcohol use No   OB History    No data available     Review of Systems  Constitutional: Negative for chills and fever.  HENT: Negative for congestion, rhinorrhea, sinus pain, sinus pressure and sore throat.   Respiratory: Positive for cough, shortness of breath and wheezing.   Cardiovascular: Negative for chest pain.  Gastrointestinal: Negative.   Musculoskeletal: Negative.   Skin: Negative for color change and rash.  Neurological: Negative.     Allergies  Penicillins  Home Medications   Prior to Admission medications   Medication Sig Start Date End Date Taking? Authorizing Provider  albuterol (PROVENTIL HFA;VENTOLIN HFA) 108 (90 Base) MCG/ACT inhaler  Inhale 1-2 puffs into the lungs every 6 (six) hours as needed for wheezing or shortness of breath. 05/24/17   Barnet Glasgow, NP  azithromycin (ZITHROMAX) 250 MG tablet Take 1 tablet (250 mg total) by mouth daily. Take first 2 tablets together, then 1 every day until finished. 05/24/17   Barnet Glasgow, NP  benzonatate (TESSALON) 100 MG capsule Take 1 capsule (100 mg total) by mouth every 8 (eight) hours. 05/24/17   Barnet Glasgow, NP  predniSONE (DELTASONE) 50 MG tablet Take 1 tablet daily with food 05/24/17   Barnet Glasgow, NP   Meds Ordered and Administered this Visit   Medications  ipratropium-albuterol (DUONEB) 0.5-2.5 (3) MG/3ML nebulizer solution 3 mL (3 mLs Nebulization Given 05/24/17 1718)  dexamethasone (DECADRON) injection 10 mg (10 mg Intramuscular Given 05/24/17 1718)    BP (!) 145/84 (BP Location: Right Arm)   Pulse 75   Temp 98.4 F (36.9 C) (Oral)   Resp 20   SpO2 95%  No data found.   Physical Exam  Constitutional: She is oriented to person, place, and time. She appears well-developed and well-nourished. No distress.  HENT:  Head: Normocephalic and atraumatic.  Right Ear: External ear normal.  Left Ear: External ear normal.  Eyes: Conjunctivae are normal.  Neck: Normal range of motion.  Cardiovascular: Normal rate and regular rhythm.   Pulmonary/Chest: Effort normal. She has wheezes.  Abdominal: Soft. Bowel sounds are normal.  Neurological: She is alert and oriented to person, place, and time.  Skin: Skin is warm and dry. Capillary refill takes less than 2 seconds. No rash  noted. She is not diaphoretic. No erythema.  Psychiatric: She has a normal mood and affect. Her behavior is normal.  Nursing note and vitals reviewed.   Urgent Care Course     Procedures (including critical care time)  Labs Review Labs Reviewed - No data to display  Imaging Review Dg Chest 2 View  Result Date: 05/24/2017 CLINICAL DATA:  Cough and low grade fever. EXAM: CHEST   2 VIEW COMPARISON:  04/20/2015 FINDINGS: Cardiomediastinal silhouette is normal. Mediastinal contours appear intact. Tortuosity and calcific atherosclerotic disease of the aorta. There is no evidence of focal airspace consolidation, pleural effusion or pneumothorax. Bilateral peribronchial thickening. Osseous structures are without acute abnormality. Soft tissues are grossly normal. IMPRESSION: Bilateral peribronchial thickening and mild bronchiectasis which may be seen with bronchitis, acute or chronic, or reactive airway disease. Forty bronchopneumonia cannot be entirely excluded. Electronically Signed   By: Fidela Salisbury M.D.   On: 05/24/2017 17:27       MDM   1. Bronchitis    Treating for bronchitis, given azithromycin, prednisone, albuterol, Tessalon, follow-up with primary care provider in one month for repeat x-rays to rule out pneumonia, or otherwise ensure clearance. Return to clinic as necessary.    Barnet Glasgow, NP 05/24/17 1757

## 2017-05-24 NOTE — Discharge Instructions (Signed)
I am treating you for bronchitis. I have prescribed Azithromycin. Take 2 tablets today, then 1 tablet daily till finished. I have also prescribed a steroid called prednisone. Take one tablet daily with food. For cough, I have prescribed a medication called Tessalon. Take 1 tablet every 8 hours as needed for your cough. For wheezing or shortness of breath, I prescribed albuterol, take 1-2 puffs every 4-6 hours as needed. Also, schedule an appointment with her primary care provider in one month for repeat x-rays to rule out pneumonia and to ensure clearance. Should your symptoms fail to improve or worsen, follow up with your primary care provider, or return to clinic.

## 2017-09-12 ENCOUNTER — Ambulatory Visit (HOSPITAL_COMMUNITY)
Admission: EM | Admit: 2017-09-12 | Discharge: 2017-09-12 | Disposition: A | Discharge status: Home / Self Care | Payer: Medicare Other | Attending: Family Medicine | Admitting: Family Medicine

## 2017-09-12 ENCOUNTER — Encounter (HOSPITAL_COMMUNITY): Payer: Self-pay | Admitting: Emergency Medicine

## 2017-09-12 DIAGNOSIS — R42 Dizziness and giddiness: Secondary | ICD-10-CM

## 2017-09-12 DIAGNOSIS — H6123 Impacted cerumen, bilateral: Secondary | ICD-10-CM

## 2017-09-12 MED ORDER — NEOMYCIN-POLYMYXIN-HC 3.5-10000-1 OT SUSP: 4.0000 [drp] | Freq: Three times a day (TID) | OTIC | 1 refills | Status: DC

## 2017-09-12 NOTE — ED Provider Notes (Signed)
  [place]   [id number] 09/12/17 Arrival Time: 1159   SUBJECTIVE:  Wendy Ruiz is a 61 y.o. female who presents to the urgent care with complaint of decreased hearing in both ears along with some dizziness each of which has been present since last Friday (4 days).  Patient has had ear wax impaction in the past. 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     Past Medical History:  Diagnosis Date  . Bipolar 1 disorder ([place])   . 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 Obesity    No family history on file. Social History   Social History  . Marital status: Widowed    Spouse name: N/A  . Number of children: N/A  . Years of education: N/A   Occupational History  . Not on file.   Social History Main Topics  . Smoking status: Former Research scientist (life sciences)  . Smokeless tobacco: Not on file  . Alcohol use No  . Drug use: No  . Sexual activity: Not on file   Other Topics Concern  . Not on file   Social History Narrative  . No narrative on file   No outpatient prescriptions have been marked as taking for the 09/12/17 encounter [facility] Encounter).   Allergies  Allergen Reactions  . Penicillins Rash      ROS: As per HPI, remainder of ROS negative.   OBJECTIVE:   Vitals:   09/12/17 1224  BP: 113/82  Pulse: 74  Resp: (!) 22  Temp: 98.7 F (37.1 C)  TempSrc: Oral  SpO2: 96%     General appearance: alert; no distress Eyes: PERRL; EOMI; conjunctiva normal HENT: normocephalic; atraumatic; TMs obscured with cerumen impacted bilaterally, external ears normal without trauma; nasal mucosa normal; oral mucosa normal Neck: supple Back: no CVA tenderness Extremities: no cyanosis or edema; symmetrical with no gross deformities Skin: warm and dry Neurologic: normal gait; grossly normal Psychological: alert and cooperative; normal mood and affect      Labs:  Results for orders placed or performed during the  hospital encounter of 04/20/15  Culture, Group A Strep  Result Value Ref Range   Strep A Culture Comment (A)   POCT rapid strep A [facility])  Result Value Ref Range   Streptococcus, Group A Screen (Direct) NEGATIVE NEGATIVE    Labs Reviewed - No data to display  No results found.     ASSESSMENT & PLAN:  No diagnosis found.  No orders of the defined types were placed in this encounter.   Reviewed expectations re: course of current medical issues. Questions answered. Outlined signs and symptoms indicating need for more acute intervention. Patient verbalized understanding. After Visit Summary given.    Procedures:      Robyn Haber, MD 09/12/17 1510

## 2017-09-12 NOTE — ED Triage Notes (Signed)
Ear pressure and dizziness for 4 days, the right ear is worse than left ear

## 2017-11-28 ENCOUNTER — Ambulatory Visit (HOSPITAL_COMMUNITY)
Admission: EM | Admit: 2017-11-28 | Discharge: 2017-11-28 | Disposition: A | Discharge status: Home / Self Care | Payer: Medicare Other | Attending: Family Medicine | Admitting: Family Medicine

## 2017-11-28 DIAGNOSIS — J40 Bronchitis, not specified as acute or chronic: Secondary | ICD-10-CM

## 2017-11-28 DIAGNOSIS — R05 Cough: Secondary | ICD-10-CM

## 2017-11-28 MED ORDER — ESOMEPRAZOLE MAGNESIUM 40 MG PO CPDR: 40.0000 mg | DELAYED_RELEASE_CAPSULE | Freq: Every day | ORAL | 0 refills | Status: DC

## 2017-11-28 MED ORDER — BENZONATATE 100 MG PO CAPS: 100.0000 mg | ORAL_CAPSULE | Freq: Three times a day (TID) | ORAL | 0 refills | Status: DC | PRN

## 2017-11-28 MED ORDER — AEROCHAMBER PLUS FLO-VU LARGE MISC
Status: AC
  Filled 2017-11-28: qty 1

## 2017-11-28 MED ORDER — ALBUTEROL SULFATE HFA 108 (90 BASE) MCG/ACT IN AERS
INHALATION_SPRAY | RESPIRATORY_TRACT | Status: AC
  Filled 2017-11-28: qty 6.7

## 2017-11-28 MED ORDER — ALBUTEROL SULFATE HFA 108 (90 BASE) MCG/ACT IN AERS
2.0000 | INHALATION_SPRAY | Freq: Four times a day (QID) | RESPIRATORY_TRACT | Status: DC
  Administered 2017-11-28: 2 via RESPIRATORY_TRACT

## 2017-11-28 NOTE — ED Provider Notes (Signed)
[place]   [id number] 11/28/17 Arrival Time: 1618   SUBJECTIVE:  Wendy Ruiz is a 61 y.o. female who presents to the urgent care with complaint of cough and pain with cough; pt sts pain in mid chest area Patient states that she has recurrent problems with cough and that she's been diagnosed with GERD in the past. She's not taking anything for the GERD currently.  She's had no diaphoresis, fever, nausea or vomiting, or neck or arm pain.  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   Past Medical History:  Diagnosis Date  . Bipolar 1 disorder ([place])   . 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 Obesity    No family history on file. Social History   Socioeconomic History  . Marital status: Widowed    Spouse name: Not on file  . Number of children: Not on file  . Years of education: Not on file  . Highest education level: Not on file  Social Needs  . Financial resource strain: Not on file  . Food insecurity - worry: Not on file  . Food insecurity - inability: Not on file  . Transportation needs - medical: Not on file  . Transportation needs - non-medical: Not on file  Occupational History  . Not on file  Tobacco Use  . Smoking status: Former Smoker  Substance and Sexual Activity  . Alcohol use: No  . Drug use: No  . Sexual activity: Not on file  Other Topics Concern  . Not on file  Social History Narrative  . Not on file   No outpatient medications have been marked as taking for the 11/28/17 encounter [facility] Encounter).   Allergies  Allergen Reactions  . Penicillins Rash      ROS: As per HPI, remainder of ROS negative.   OBJECTIVE:   Vitals:   11/28/17 1632  BP: (!) 137/109  Pulse: 78  Resp: 18  Temp: 98.4 F (36.9 C)  TempSrc: Oral  SpO2: 97%     General appearance: alert; no distress Eyes: PERRL; EOMI; conjunctiva normal HENT: normocephalic; atraumatic; TMs normal, canal  normal, external ears normal without trauma; nasal mucosa normal; oral mucosa normal Neck: supple Lungs: clear to auscultation bilaterally Heart: regular rate and rhythm; crescendo-decrescendo murmur right heart border. Back: no CVA tenderness Extremities: no cyanosis or edema; symmetrical with no gross deformities Skin: warm and dry Neurologic: normal gait; grossly normal Psychological: alert and cooperative; normal mood and affect      Labs:  Results for orders placed or performed during the hospital encounter of 04/20/15  Culture, Group A Strep  Result Value Ref Range   Strep A Culture Comment (A)   POCT rapid strep A [facility])  Result Value Ref Range   Streptococcus, Group A Screen (Direct) NEGATIVE NEGATIVE      ASSESSMENT & PLAN:  1. Bronchitis     Meds ordered this encounter  Medications  . albuterol (PROVENTIL HFA;VENTOLIN HFA) 108 (90 Base) MCG/ACT inhaler 2 puff  . benzonatate (TESSALON) 100 MG capsule    Sig: Take 1-2 capsules (100-200 mg total) by mouth 3 (three) times daily as needed for cough.    Dispense:  20 capsule    Refill:  0  . esomeprazole (NEXIUM) 40 MG capsule    Sig: Take 1 capsule (40 mg total) by mouth daily.    Dispense:  30 capsule    Refill:  0  Reviewed expectations re: course of current medical issues. Questions answered. Outlined signs and symptoms indicating need for more acute intervention. Patient verbalized understanding. After Visit Summary given.    Procedures:      Robyn Haber, MD 11/28/17 [phone]

## 2017-11-28 NOTE — ED Triage Notes (Signed)
Pt sts cough and pain with cough; pt sts pain in mid chest area

## 2018-01-12 ENCOUNTER — Ambulatory Visit (INDEPENDENT_AMBULATORY_CARE_PROVIDER_SITE_OTHER): Payer: Medicare Other

## 2018-01-12 ENCOUNTER — Ambulatory Visit (HOSPITAL_COMMUNITY)
Admission: EM | Admit: 2018-01-12 | Discharge: 2018-01-12 | Disposition: A | Discharge status: Home / Self Care | Payer: Medicare Other | Attending: Internal Medicine | Admitting: Internal Medicine

## 2018-01-12 ENCOUNTER — Encounter (HOSPITAL_COMMUNITY): Payer: Self-pay | Admitting: Emergency Medicine

## 2018-01-12 DIAGNOSIS — J22 Unspecified acute lower respiratory infection: Secondary | ICD-10-CM

## 2018-01-12 DIAGNOSIS — R05 Cough: Secondary | ICD-10-CM

## 2018-01-12 MED ORDER — BENZONATATE 100 MG PO CAPS: 100.0000 mg | ORAL_CAPSULE | Freq: Three times a day (TID) | ORAL | 0 refills | Status: DC | PRN

## 2018-01-12 MED ORDER — AZITHROMYCIN 250 MG PO TABS
ORAL_TABLET | ORAL | 0 refills | Status: DC
Start: 2018-01-12 — End: 2019-01-20

## 2018-01-12 MED ORDER — PREDNISONE 20 MG PO TABS: 40.0000 mg | ORAL_TABLET | Freq: Every day | ORAL | 0 refills | Status: AC

## 2018-01-12 NOTE — ED Triage Notes (Signed)
PT C/O: cold sx onset 5 days associated w/prod cough, fever, back pain, nasal drainage/congestion  TAKING MEDS: Nyquil   A&O x4... NAD... Ambulatory

## 2018-01-12 NOTE — Discharge Instructions (Signed)
Push fluids to ensure adequate hydration and keep secretions thin. . Tylenol and/or ibuprofen as needed for pain or fevers.  Complete course of antibiotics. Complete course of steroids as well. Tessalon as needed for cough. If develop increased pain, shortness of breath, fevers, otherwise worsening, or not improving in the next week please return to be seen or follow up for reevaluation with your primary care provider.

## 2018-01-12 NOTE — ED Provider Notes (Signed)
[place]    CSN: [id number] Arrival date & time: 01/12/18  1118     History   Chief Complaint Chief Complaint  Patient presents with  . URI    HPI Wendy Ruiz is a 62 y.o. female.   Wendy Ruiz presents with complaints of cough and back pain. She states her symptoms started approximately 5 days ago with fevers, congestion and cough which have overall improved. No further fevers. Last night developed back pain however. States she has had pneumonia in the past which felt similar. Sore throat due to cough. Denies shortness of breath, chest pain, ear pain, gi/gu complaints. Granddaughter with URI symptoms as well. Nasal drainage and congestion. No asthma history. Does not smoke. History of bipolar, cough, gerd. nexium for gerd, has not taken this week.     ROS per HPI.       Past Medical History:  Diagnosis Date  . Bipolar 1 disorder ([place])   . 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 Obesity     Patient Active Problem List   Diagnosis Date Noted  . Right flank pain 05/14/2013  . Chronic cough 11/15/2011  . OBESITY NOS 05/22/2007  . BIPOLAR DISORDER 02/08/2007    History reviewed. No pertinent surgical history.  OB History    No data available       Home Medications    Prior to Admission medications   Medication Sig Start Date End Date Taking? Authorizing Provider  azithromycin (ZITHROMAX) 250 MG tablet Take first 2 tablets together, then 1 every day until finished. 01/12/18   Zigmund Gottron, NP  benzonatate (TESSALON) 100 MG capsule Take 1-2 capsules (100-200 mg total) by mouth 3 (three) times daily as needed for cough. 01/12/18   Zigmund Gottron, NP  esomeprazole (NEXIUM) 40 MG capsule Take 1 capsule (40 mg total) by mouth daily. 11/28/17   Robyn Haber, MD  predniSONE (DELTASONE) 20 MG tablet Take 2 tablets (40 mg total) by mouth daily with breakfast for 5 days. 01/12/18 01/17/18  Zigmund Gottron, NP    Family History History reviewed. No  pertinent family history.  Social History Social History   Tobacco Use  . Smoking status: Former Research scientist (life sciences)  . Smokeless tobacco: Never Used  Substance Use Topics  . Alcohol use: No  . Drug use: No     Allergies   Penicillins   Review of Systems Review of Systems   Physical Exam Triage Vital Signs ED Triage Vitals [01/12/18 1219]  Enc Vitals Group     BP 114/82     Pulse Rate 80     Resp 20     Temp 98.4 F (36.9 C)     Temp Source Oral     SpO2 96 %     Weight      Height      Head Circumference      Peak Flow      Pain Score      Pain Loc      Pain Edu?      Excl. in [place]?    No data found.  Updated Vital Signs BP 114/82 (BP Location: Right Arm)   Pulse 80   Temp 98.4 F (36.9 C) (Oral)   Resp 20   SpO2 96%   Visual Acuity Right Eye Distance:   Left Eye Distance:   Bilateral Distance:    Right Eye Near:   Left Eye Near:    Bilateral Near:  Physical Exam  Constitutional: She is oriented to person, place, and time. She appears well-developed and well-nourished. No distress.  HENT:  Head: Normocephalic and atraumatic.  Right Ear: Tympanic membrane, external ear and ear canal normal.  Left Ear: Tympanic membrane, external ear and ear canal normal.  Nose: Nose normal.  Mouth/Throat: Uvula is midline, oropharynx is clear and moist and mucous membranes are normal. No tonsillar exudate.  Eyes: Conjunctivae and EOM are normal. Pupils are equal, round, and reactive to light.  Cardiovascular: Normal rate, regular rhythm and normal heart sounds.  Pulmonary/Chest: Effort normal and breath sounds normal.  Frequent strong congested cough  Lymphadenopathy:    She has cervical adenopathy.  Neurological: She is alert and oriented to person, place, and time.  Skin: Skin is warm and dry.     UC Treatments / Results  Labs (all labs ordered are listed, but only abnormal results are displayed) Labs Reviewed - No data to display  EKG  EKG  Interpretation None       Radiology Dg Chest 2 View  Result Date: 01/12/2018 CLINICAL DATA:  Chest congestion. EXAM: CHEST  2 VIEW COMPARISON:  05/24/2017.  04/20/2015. FINDINGS: Mediastinum and hilar structures normal. Mild atelectatic changes right mid lung field. No focal infiltrate. No pleural effusion or pneumothorax. Borderline cardiomegaly, stable. No pulmonary venous congestion. IMPRESSION: Mild right mid lung field subsegmental atelectasis and/or scarring. Electronically Signed   By: Marcello Moores  Register   On: 01/12/2018 13:21    Procedures Procedures (including critical care time)  Medications Ordered in UC Medications - No data to display   Initial Impression / Assessment and Plan / UC Course  I have reviewed the triage vital signs and the nursing notes.  Pertinent labs & imaging results that were available during my care of the patient were reviewed by me and considered in my medical decision making (see chart for details).     Xray obtained. With atelectasis presence questioned will started zpack at this time. 5 days of prednisone. Vitals stable, without tachypnea, tachycardia or hypoxia. Without distress noted. Return precautions provided. If symptoms worsen or do not improve in the next week to return to be seen or to follow up with PCP.  Patient verbalized understanding and agreeable to plan.    Final Clinical Impressions(s) / UC Diagnoses   Final diagnoses:  Lower respiratory tract infection    ED Discharge Orders        Ordered    predniSONE (DELTASONE) 20 MG tablet  Daily with breakfast     01/12/18 1327    azithromycin (ZITHROMAX) 250 MG tablet     01/12/18 1327    benzonatate (TESSALON) 100 MG capsule  3 times daily PRN     01/12/18 1327       Controlled Substance Prescriptions [place] Controlled Substance Registry consulted? Not Applicable   Zigmund Gottron, NP 01/12/18 1328

## 2018-02-06 NOTE — Progress Notes (Deleted)
   [facility] Phone: [phone]   Date of Visit: 02/08/2018   HPI:  Patient presents today for a well woman exam.   Concerns today: *** Periods: *** Contraception: *** Pelvic symptoms: *** Sexual activity: *** STD Screening: *** Pap smear status: *** Exercise: *** Diet: *** Smoking: *** Alcohol: *** Drugs: *** Advance directives: *** Mood: *** Dentist: ***  ROS: See HPI  [place]:  Cancers in family: ***  PMH: Obesity Bipolar DO   PHYSICAL EXAM: There were no vitals taken for this visit. Gen: NAD, pleasant, cooperative HEENT: NCAT, PERRL, no palpable thyromegaly or anterior cervical lymphadenopathy Heart: RRR, no murmurs Lungs: CTAB, NWOB Abdomen: soft, nontender to palpation Neuro: grossly nonfocal, speech normal GU: normal appearing external genitalia without lesions. Vagina is moist with white discharge. Cervix normal in appearance. No cervical motion tenderness or tenderness on bimanual exam. No adnexal masses.   ASSESSMENT/PLAN:  # Health maintenance:  -STD screening: *** -pap smear: *** -mammogram: *** -lipid screening: *** -DEXA: *** -immunizations: *** -advance directives: *** -handout given on health maintenance topics  FOLLOW UP: Follow up in *** for ***  Smiley Houseman, MD PGY [place]

## 2018-02-08 ENCOUNTER — Encounter: Payer: Medicare Other | Admitting: Internal Medicine

## 2019-01-20 ENCOUNTER — Encounter (HOSPITAL_COMMUNITY): Payer: Self-pay | Admitting: Emergency Medicine

## 2019-01-20 ENCOUNTER — Ambulatory Visit (HOSPITAL_COMMUNITY)
Admission: EM | Admit: 2019-01-20 | Discharge: 2019-01-20 | Disposition: A | Discharge status: Home / Self Care | Payer: Self-pay | Attending: Internal Medicine | Admitting: Internal Medicine

## 2019-01-20 DIAGNOSIS — H6123 Impacted cerumen, bilateral: Secondary | ICD-10-CM

## 2019-01-20 MED ORDER — NEOMYCIN-POLYMYXIN-HC 3.5-10000-1 OT SUSP
3.0000 [drp] | Freq: Four times a day (QID) | OTIC | 0 refills | Status: DC
Start: 2019-01-20 — End: 2020-09-23

## 2019-01-20 NOTE — ED Triage Notes (Signed)
Pt sts ear pain and fullness

## 2019-01-20 NOTE — Discharge Instructions (Addendum)
May use debrox- 5-10 drops weekly to help prevent build up Please use ear drops provided 3 drops every 6 hours to prevent infection Follow up if pain worsening

## 2019-01-20 NOTE — ED Provider Notes (Signed)
[place]    CSN: [id number] Arrival date & time: 01/20/19  1057     History   Chief Complaint Chief Complaint  Patient presents with  . Otalgia    HPI Wendy Ruiz is a 63 y.o. female history of bipolar type I presenting today for evaluation of ear pain and fullness.  Patient states that over the past week she has noticed decreased hearing and discomfort in her ear.  States that this feels very similar to when she is previously needed earwax removal.  She has tried using an over-the-counter drops without relief.  Denies URI symptoms.  Denies fevers.  HPI  Past Medical History:  Diagnosis Date  . Bipolar 1 disorder ([place])   . 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 Obesity     Patient Active Problem List   Diagnosis Date Noted  . Right flank pain 05/14/2013  . Chronic cough 11/15/2011  . OBESITY NOS 05/22/2007  . BIPOLAR DISORDER 02/08/2007    History reviewed. No pertinent surgical history.  OB History   No obstetric history on file.      Home Medications    Prior to Admission medications   Medication Sig Start Date End Date Taking? Authorizing Provider  esomeprazole (NEXIUM) 40 MG capsule Take 1 capsule (40 mg total) by mouth daily. 11/28/17   Robyn Haber, MD  neomycin-polymyxin-hydrocortisone (CORTISPORIN) 3.5-10000-1 OTIC suspension Place 3 drops into both ears 4 (four) times daily. 01/20/19   Gertude Benito, Elesa Hacker, PA-C    Family History History reviewed. No pertinent family history.  Social History Social History   Tobacco Use  . Smoking status: Former Research scientist (life sciences)  . Smokeless tobacco: Never Used  Substance Use Topics  . Alcohol use: No  . Drug use: No     Allergies   Penicillins   Review of Systems Review of Systems  Constitutional: Negative for activity change, appetite change, chills, fatigue and fever.  HENT: Positive for ear pain and hearing loss. Negative for congestion, rhinorrhea, sinus pressure, sore throat and trouble  swallowing.   Eyes: Negative for discharge and redness.  Respiratory: Negative for cough, chest tightness and shortness of breath.   Cardiovascular: Negative for chest pain.  Gastrointestinal: Negative for abdominal pain, diarrhea, nausea and vomiting.  Musculoskeletal: Negative for myalgias.  Skin: Negative for rash.  Neurological: Negative for dizziness, light-headedness and headaches.     Physical Exam Triage Vital Signs ED Triage Vitals  Enc Vitals Group     BP 01/20/19 1201 127/86     Pulse Rate 01/20/19 1201 72     Resp 01/20/19 1201 18     Temp 01/20/19 1201 97.6 F (36.4 C)     Temp Source 01/20/19 1201 Oral     SpO2 01/20/19 1201 96 %     Weight --      Height --      Head Circumference --      Peak Flow --      Pain Score 01/20/19 1202 6     Pain Loc --      Pain Edu? --      Excl. in [place]? --    No data found.  Updated Vital Signs BP 127/86 (BP Location: Left Arm)   Pulse 72   Temp 97.6 F (36.4 C) (Oral)   Resp 18   SpO2 96%   Visual Acuity Right Eye Distance:   Left Eye Distance:   Bilateral Distance:    Right Eye Near:  Left Eye Near:    Bilateral Near:     Physical Exam Vitals signs and nursing note reviewed.  Constitutional:      General: She is not in acute distress.    Appearance: She is well-developed.  HENT:     Head: Normocephalic and atraumatic.     Ears:     Comments: Bilateral cerumen impaction, TMs not visualized  After earwax removal, left EAC with erythema and appears irritated, TMs bilaterally nonerythematous and good cone of light    Mouth/Throat:     Comments: Oral mucosa pink and moist, no tonsillar enlargement or exudate. Posterior pharynx patent and nonerythematous, no uvula deviation or swelling. Normal phonation. Eyes:     Conjunctiva/sclera: Conjunctivae normal.  Neck:     Musculoskeletal: Neck supple.  Cardiovascular:     Rate and Rhythm: Normal rate and regular rhythm.     Heart sounds: No murmur.  Pulmonary:       Effort: Pulmonary effort is normal. No respiratory distress.     Breath sounds: Normal breath sounds.  Abdominal:     Palpations: Abdomen is soft.     Tenderness: There is no abdominal tenderness.  Skin:    General: Skin is warm and dry.  Neurological:     Mental Status: She is alert.      UC Treatments / Results  Labs (all labs ordered are listed, but only abnormal results are displayed) Labs Reviewed - No data to display  EKG None  Radiology No results found.  Procedures Procedures (including critical care time)  Medications Ordered in UC Medications - No data to display  Initial Impression / Assessment and Plan / UC Course  I have reviewed the triage vital signs and the nursing notes.  Pertinent labs & imaging results that were available during my care of the patient were reviewed by me and considered in my medical decision making (see chart for details).     Cerumen impaction removed bilaterally, will recommend Debrox weekly to help prevent further buildup.  Will provide Cortisporin to use to prevent development of otitis externa secondary to cerumen removal.  Continue to monitor, Discussed strict return precautions. Patient verbalized understanding and is agreeable with plan.  Final Clinical Impressions(s) / UC Diagnoses   Final diagnoses:  Bilateral impacted cerumen     Discharge Instructions     May use debrox- 5-10 drops weekly to help prevent build up Please use ear drops provided 3 drops every 6 hours to prevent infection Follow up if pain worsening    ED Prescriptions    Medication Sig Dispense Auth. Provider   neomycin-polymyxin-hydrocortisone (CORTISPORIN) 3.5-10000-1 OTIC suspension Place 3 drops into both ears 4 (four) times daily. 10 mL Jaynee Winters C, PA-C     Controlled Substance Prescriptions [place] Controlled Substance Registry consulted? Not Applicable   Janith Lima, [place] 01/20/19 1317

## 2019-01-23 ENCOUNTER — Telehealth (HOSPITAL_COMMUNITY): Payer: Self-pay | Admitting: Emergency Medicine

## 2019-01-23 MED ORDER — ACETIC ACID 2 % OT SOLN: 4.0000 [drp] | Freq: Four times a day (QID) | OTIC | 0 refills | Status: DC

## 2019-01-23 NOTE — Telephone Encounter (Signed)
Acetic acid drops sent as alternative for cheaper tx of otitis externa

## 2020-08-29 ENCOUNTER — Emergency Department (HOSPITAL_COMMUNITY): Payer: HRSA Program

## 2020-08-29 ENCOUNTER — Other Ambulatory Visit: Payer: Self-pay

## 2020-08-29 ENCOUNTER — Encounter (HOSPITAL_COMMUNITY): Payer: Self-pay | Admitting: *Deleted

## 2020-08-29 ENCOUNTER — Inpatient Hospital Stay (HOSPITAL_COMMUNITY)
Admission: EM | Admit: 2020-08-29 | Discharge: 2020-09-29 | DRG: 177 | Disposition: A | Discharge status: Home Health | Payer: HRSA Program | Attending: Internal Medicine | Admitting: Internal Medicine

## 2020-08-29 DIAGNOSIS — U071 COVID-19: Principal | ICD-10-CM | POA: Diagnosis present

## 2020-08-29 DIAGNOSIS — R0602 Shortness of breath: Secondary | ICD-10-CM

## 2020-08-29 DIAGNOSIS — Y92230 Patient room in hospital as the place of occurrence of the external cause: Secondary | ICD-10-CM | POA: Diagnosis not present

## 2020-08-29 DIAGNOSIS — R7401 Elevation of levels of liver transaminase levels: Secondary | ICD-10-CM | POA: Diagnosis present

## 2020-08-29 DIAGNOSIS — F419 Anxiety disorder, unspecified: Secondary | ICD-10-CM | POA: Diagnosis present

## 2020-08-29 DIAGNOSIS — K219 Gastro-esophageal reflux disease without esophagitis: Secondary | ICD-10-CM | POA: Diagnosis present

## 2020-08-29 DIAGNOSIS — A4189 Other specified sepsis: Secondary | ICD-10-CM | POA: Diagnosis present

## 2020-08-29 DIAGNOSIS — Z0184 Encounter for antibody response examination: Secondary | ICD-10-CM

## 2020-08-29 DIAGNOSIS — Z813 Family history of other psychoactive substance abuse and dependence: Secondary | ICD-10-CM

## 2020-08-29 DIAGNOSIS — J1282 Pneumonia due to coronavirus disease 2019: Secondary | ICD-10-CM | POA: Diagnosis present

## 2020-08-29 DIAGNOSIS — Z8261 Family history of arthritis: Secondary | ICD-10-CM

## 2020-08-29 DIAGNOSIS — R7989 Other specified abnormal findings of blood chemistry: Secondary | ICD-10-CM | POA: Diagnosis not present

## 2020-08-29 DIAGNOSIS — Z86711 Personal history of pulmonary embolism: Secondary | ICD-10-CM

## 2020-08-29 DIAGNOSIS — Z86718 Personal history of other venous thrombosis and embolism: Secondary | ICD-10-CM

## 2020-08-29 DIAGNOSIS — Z818 Family history of other mental and behavioral disorders: Secondary | ICD-10-CM

## 2020-08-29 DIAGNOSIS — F319 Bipolar disorder, unspecified: Secondary | ICD-10-CM | POA: Diagnosis present

## 2020-08-29 DIAGNOSIS — Z6839 Body mass index (BMI) 39.0-39.9, adult: Secondary | ICD-10-CM

## 2020-08-29 DIAGNOSIS — R739 Hyperglycemia, unspecified: Secondary | ICD-10-CM

## 2020-08-29 DIAGNOSIS — Z79899 Other long term (current) drug therapy: Secondary | ICD-10-CM

## 2020-08-29 DIAGNOSIS — R059 Cough, unspecified: Secondary | ICD-10-CM

## 2020-08-29 DIAGNOSIS — Z880 Allergy status to penicillin: Secondary | ICD-10-CM

## 2020-08-29 DIAGNOSIS — Z87891 Personal history of nicotine dependence: Secondary | ICD-10-CM

## 2020-08-29 DIAGNOSIS — J80 Acute respiratory distress syndrome: Secondary | ICD-10-CM | POA: Diagnosis present

## 2020-08-29 DIAGNOSIS — D649 Anemia, unspecified: Secondary | ICD-10-CM | POA: Diagnosis present

## 2020-08-29 DIAGNOSIS — J9601 Acute respiratory failure with hypoxia: Secondary | ICD-10-CM

## 2020-08-29 DIAGNOSIS — T380X5A Adverse effect of glucocorticoids and synthetic analogues, initial encounter: Secondary | ICD-10-CM | POA: Diagnosis not present

## 2020-08-29 DIAGNOSIS — K5900 Constipation, unspecified: Secondary | ICD-10-CM | POA: Diagnosis present

## 2020-08-29 DIAGNOSIS — J42 Unspecified chronic bronchitis: Secondary | ICD-10-CM | POA: Diagnosis present

## 2020-08-29 LAB — CBC WITH DIFFERENTIAL/PLATELET
Abs Immature Granulocytes: 0.05 10*3/uL (ref 0.00–0.07)
Basophils Absolute: 0 10*3/uL (ref 0.0–0.1)
Basophils Relative: 0 %
Eosinophils Absolute: 0 10*3/uL (ref 0.0–0.5)
Eosinophils Relative: 0 %
HCT: 38.4 % (ref 36.0–46.0)
Hemoglobin: 11.8 g/dL — ABNORMAL LOW (ref 12.0–15.0)
Immature Granulocytes: 1 %
Lymphocytes Relative: 8 %
Lymphs Abs: 0.7 10*3/uL (ref 0.7–4.0)
MCH: 26.2 pg (ref 26.0–34.0)
MCHC: 30.7 g/dL (ref 30.0–36.0)
MCV: 85.1 fL (ref 80.0–100.0)
Monocytes Absolute: 0.3 10*3/uL (ref 0.1–1.0)
Monocytes Relative: 4 %
Neutro Abs: 7.8 10*3/uL — ABNORMAL HIGH (ref 1.7–7.7)
Neutrophils Relative %: 87 %
Platelets: 210 10*3/uL (ref 150–400)
RBC: 4.51 MIL/uL (ref 3.87–5.11)
RDW: 12.3 % (ref 11.5–15.5)
WBC: 8.9 10*3/uL (ref 4.0–10.5)
nRBC: 0 % (ref 0.0–0.2)

## 2020-08-29 LAB — D-DIMER, QUANTITATIVE: D-Dimer, Quant: 1.58 ug/mL-FEU — ABNORMAL HIGH (ref 0.00–0.50)

## 2020-08-29 LAB — LACTIC ACID, PLASMA: Lactic Acid, Venous: 1.9 mmol/L (ref 0.5–1.9)

## 2020-08-29 LAB — FIBRINOGEN: Fibrinogen: 788 mg/dL — ABNORMAL HIGH (ref 210–475)

## 2020-08-29 NOTE — ED Notes (Signed)
Caryl Asp, daughter, [phone] would like an update when available

## 2020-08-29 NOTE — ED Triage Notes (Signed)
The pt arived by gems from home diffikculty breathing all day  sats low when fire responded  She arrived on a non-rebreather on 15 liters  sats 92 % alert denies any history

## 2020-08-29 NOTE — ED Notes (Signed)
High flow 02 at 10

## 2020-08-29 NOTE — ED Provider Notes (Addendum)
[facility] EMERGENCY DEPARTMENT Provider Note   CSN: [id number] Arrival date & time: 08/29/20  2214     History Chief Complaint  Patient presents with  . Shortness of Breath    Wendy Ruiz is a 64 y.o. female.  HPI 64 year old female presents with respiratory distress.  Patient is an overall poor historian but history is obtained from patient and EMS.  Patient endorses URI symptoms including cough and low-grade fever for about 6 days.  Thought she had a cold.  She has not been vaccinated versus Covid and no obvious known sick contacts of Covid.  EMS was called because the patient was acting strange and I thought she might be having a stroke.  O2 sats in the 50s when EMS arrived and she was placed on nonrebreather with good response.  Patient specifically denies chest pain or shortness of breath.   Past Medical History:  Diagnosis Date  . Bipolar 1 disorder ([place])   . 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 Obesity     Patient Active Problem List   Diagnosis Date Noted  . Right flank pain 05/14/2013  . Chronic cough 11/15/2011  . OBESITY NOS 05/22/2007  . BIPOLAR DISORDER 02/08/2007    History reviewed. No pertinent surgical history.   OB History   No obstetric history on file.     No family history on file.  Social History   Tobacco Use  . Smoking status: Former Research scientist (life sciences)  . Smokeless tobacco: Never Used  Substance Use Topics  . Alcohol use: No  . Drug use: No    Home Medications Prior to Admission medications   Medication Sig Start Date End Date Taking? Authorizing Provider  acetic acid 2 % otic solution Place 4 drops into both ears 4 (four) times daily. 01/23/19   Wieters, Hallie C, PA-C  esomeprazole (NEXIUM) 40 MG capsule Take 1 capsule (40 mg total) by mouth daily. 11/28/17   Robyn Haber, MD  neomycin-polymyxin-hydrocortisone (CORTISPORIN) 3.5-10000-1 OTIC suspension Place 3 drops into both ears 4 (four) times daily. 01/20/19   Wieters,  Hallie C, PA-C    Allergies    Penicillins  Review of Systems   Review of Systems  Constitutional: Positive for fever.  Respiratory: Positive for cough. Negative for shortness of breath.   Cardiovascular: Negative for chest pain.  All other systems reviewed and are negative.   Physical Exam Updated Vital Signs Ht 5\' 4"  (1.626 m)   Wt 102.5 kg   BMI 38.79 kg/m   Physical Exam Vitals and nursing note reviewed.  Constitutional:      Appearance: She is well-developed. She is obese.  HENT:     Head: Normocephalic and atraumatic.     Right Ear: External ear normal.     Left Ear: External ear normal.     Nose: Nose normal.  Eyes:     General:        Right eye: No discharge.        Left eye: No discharge.  Cardiovascular:     Rate and Rhythm: Regular rhythm. Tachycardia present.     Heart sounds: Normal heart sounds.  Pulmonary:     Effort: Pulmonary effort is normal. Tachypnea present. No accessory muscle usage or respiratory distress.     Breath sounds: Rales present.  Abdominal:     Palpations: Abdomen is soft.     Tenderness: There is no abdominal tenderness.  Skin:    General: Skin is warm and dry.  Neurological:     Mental Status: She is alert and oriented to person, place, and time.  Psychiatric:        Mood and Affect: Mood is not anxious.     ED Results / Procedures / Treatments   Labs (all labs ordered are listed, but only abnormal results are displayed) Labs Reviewed  SARS CORONAVIRUS 2 BY RT PCR (HOSPITAL ORDER, [place] [facility])  CULTURE, BLOOD (ROUTINE X 2)  CULTURE, BLOOD (ROUTINE X 2)  LACTIC ACID, PLASMA  LACTIC ACID, PLASMA  CBC WITH DIFFERENTIAL/PLATELET  COMPREHENSIVE METABOLIC PANEL  D-DIMER, QUANTITATIVE (NOT AT [facility])  PROCALCITONIN  LACTATE DEHYDROGENASE  FERRITIN  TRIGLYCERIDES  FIBRINOGEN  C-REACTIVE PROTEIN    EKG EKG Interpretation  Date/Time:  Saturday August 29 2020 22:15:58 EDT Ventricular Rate:   118 PR Interval:    QRS Duration: 84 QT Interval:  314 QTC Calculation: 440 R Axis:   21 Text Interpretation: Sinus tachycardia RSR' in V1 or V2, probably normal variant No old tracing to compare Confirmed by Sherwood Gambler [phone]) on 08/29/2020 10:34:52 PM   Radiology CT ANGIO CHEST PE W OR WO CONTRAST  Result Date: 08/30/2020 CLINICAL DATA:  COVID-19 infection, now with cough, fever and shortness of breath for the past 2 days. Evaluate for pulmonary embolism. EXAM: CT ANGIOGRAPHY CHEST WITH CONTRAST TECHNIQUE: Multidetector CT imaging of the chest was performed using the standard protocol during bolus administration of intravenous contrast. Multiplanar CT image reconstructions and MIPs were obtained to evaluate the vascular anatomy. CONTRAST:  68mL OMNIPAQUE IOHEXOL 350 MG/ML SOLN COMPARISON:  Chest CT-08/29/2020; 01/12/2018; 05/24/2017 FINDINGS: Vascular Findings: There is suboptimal opacification of the pulmonary arterial system with the main pulmonary artery measuring only 195 Hounsfield units. Given this limitation, there are no discrete filling defects within the pulmonary arterial tree to the level the bilateral subsegmental pulmonary arteries. Evaluation of the distal subsegmental pulmonary arteries is degraded secondary to a combination of suboptimal vessel opacification as well as patient respiratory artifact. Normal caliber of the main pulmonary artery. Cardiomegaly.  No pericardial effusion. Mild ectasia of the ascending thoracic aorta measuring 38 mm in diameter. Conventional configuration of the aortic arch. The descending thoracic aorta is of normal caliber and widely patent without hemodynamically significant narrowing. There is a minimal amount of atherosclerotic plaque involving the aortic arch and proximal descending thoracic aorta, not resulting in hemodynamically significant stenosis. Review of the MIP images confirms the above findings.  ---------------------------------------------------------------------------------- Nonvascular Findings: Mediastinum/Lymph Nodes: Scattered prominent mediastinal and hilar lymph nodes are not enlarged by size criteria with index AP window lymph node measuring 0.9 cm in greatest short axis diameter (image 33, series 5) and index left infrahilar lymph node measuring 0.8 cm (image 43, series 5), presumably reactive in etiology. No bulky mediastinal, hilar or axillary lymphadenopathy. Lungs/Pleura: Extensive bilateral heterogeneous and consolidative airspace opacities involving near the entirety of the lungs bilaterally with associated scattered air bronchograms. No pleural effusion or pneumothorax. The central pulmonary airways appear widely patent. Upper abdomen: Limited arterial phase of the upper abdomen demonstrates multiple cholesterol containing gallstones within an otherwise normal-appearing gallbladder. Musculoskeletal: No acute or aggressive osseous abnormalities. Stigmata of dish within the thoracic spine. Regional soft tissues appear normal. Normal appearance of the thyroid gland. IMPRESSION: 1. No evidence of pulmonary embolism to the level the bilateral subsegmental pulmonary arteries. 2. Extensive heterogeneous and consolidative airspace opacities involving near the entirety of the lungs bilaterally compatible with provided history of COVID 19 pneumonia. 3. Cardiomegaly. 4.  Aortic Atherosclerosis (ICD10-I70.0). 5. Cholelithiasis. Electronically Signed   By: Sandi Mariscal M.D.   On: 08/30/2020 09:00   DG Chest Port 1 View  Result Date: 08/29/2020 CLINICAL DATA:  COVID-19 positive, hypoxia, cough EXAM: PORTABLE CHEST 1 VIEW COMPARISON:  01/12/2018 FINDINGS: Single frontal view of the chest demonstrates unremarkable cardiac silhouette. There is widespread bilateral airspace disease, without effusion or pneumothorax. No acute bony abnormality. IMPRESSION: 1. Bilateral multifocal pneumonia compatible with  COVID 19. Electronically Signed   By: Randa Ngo M.D.   On: 08/29/2020 22:54    Procedures .Critical Care Performed by: Sherwood Gambler, MD Authorized by: Sherwood Gambler, MD   Critical care provider statement:    Critical care time (minutes):  35   Critical care time was exclusive of:  Separately billable procedures and treating other patients   Critical care was necessary to treat or prevent imminent or life-threatening deterioration of the following conditions:  Respiratory failure   Critical care was time spent personally by me on the following activities:  Discussions with consultants, evaluation of patient's response to treatment, examination of patient, ordering and performing treatments and interventions, ordering and review of laboratory studies, ordering and review of radiographic studies, pulse oximetry, re-evaluation of patient's condition, obtaining history from patient or surrogate and review of old charts   (including critical care time)  Medications Ordered in ED Medications - No data to display  ED Course  I have reviewed the triage vital signs and the nursing notes.  Pertinent labs & imaging results that were available during my care of the patient were reviewed by me and considered in my medical decision making (see chart for details).    MDM Rules/Calculators/A&P                          Patient is saturating well on the nonrebreather.  She is mildly tachypneic but is in no respiratory distress at this time.  Will discuss with respiratory therapy about switching her to high flow nasal cannula.  Otherwise, high suspicion this is Covid.  Chest x-ray has been personally reviewed and is showing significant pneumonia.  At this point I do not think she needs BiPAP or intubation though she will need to be closely monitored.  No hypotension.  Labs are currently pending and she will need admission.  Care transferred to Dr. Betsey Holiday.  HAVYN RAMO was evaluated in  Emergency Department on 08/29/2020 for the symptoms described in the history of present illness. She was evaluated in the context of the global COVID-19 pandemic, which necessitated consideration that the patient might be at risk for infection with the SARS-CoV-2 virus that causes COVID-19. Institutional protocols and algorithms that pertain to the evaluation of patients at risk for COVID-19 are in a state of rapid change based on information released by regulatory bodies including the CDC and federal and state organizations. These policies and algorithms were followed during the patient's care in the ED.  Final Clinical Impression(s) / ED Diagnoses Final diagnoses:  Acute respiratory failure with hypoxia [facility])    Rx / DC Orders ED Discharge Orders    None       Sherwood Gambler, MD 08/29/20 2322    Sherwood Gambler, MD 08/30/20 [phone]

## 2020-08-30 ENCOUNTER — Encounter (HOSPITAL_COMMUNITY): Payer: Self-pay | Admitting: Family Medicine

## 2020-08-30 ENCOUNTER — Inpatient Hospital Stay (HOSPITAL_COMMUNITY): Payer: HRSA Program

## 2020-08-30 ENCOUNTER — Other Ambulatory Visit: Payer: Self-pay

## 2020-08-30 DIAGNOSIS — R0602 Shortness of breath: Secondary | ICD-10-CM | POA: Diagnosis present

## 2020-08-30 DIAGNOSIS — Z86711 Personal history of pulmonary embolism: Secondary | ICD-10-CM | POA: Diagnosis not present

## 2020-08-30 DIAGNOSIS — Z0184 Encounter for antibody response examination: Secondary | ICD-10-CM | POA: Diagnosis not present

## 2020-08-30 DIAGNOSIS — Z79899 Other long term (current) drug therapy: Secondary | ICD-10-CM | POA: Diagnosis not present

## 2020-08-30 DIAGNOSIS — Z20822 Contact with and (suspected) exposure to covid-19: Secondary | ICD-10-CM | POA: Diagnosis not present

## 2020-08-30 DIAGNOSIS — Z813 Family history of other psychoactive substance abuse and dependence: Secondary | ICD-10-CM | POA: Diagnosis not present

## 2020-08-30 DIAGNOSIS — R05 Cough: Secondary | ICD-10-CM | POA: Diagnosis not present

## 2020-08-30 DIAGNOSIS — Z6839 Body mass index (BMI) 39.0-39.9, adult: Secondary | ICD-10-CM | POA: Diagnosis not present

## 2020-08-30 DIAGNOSIS — Z87891 Personal history of nicotine dependence: Secondary | ICD-10-CM | POA: Diagnosis not present

## 2020-08-30 DIAGNOSIS — T380X5A Adverse effect of glucocorticoids and synthetic analogues, initial encounter: Secondary | ICD-10-CM | POA: Diagnosis not present

## 2020-08-30 DIAGNOSIS — J80 Acute respiratory distress syndrome: Secondary | ICD-10-CM | POA: Diagnosis present

## 2020-08-30 DIAGNOSIS — U071 COVID-19: Secondary | ICD-10-CM | POA: Diagnosis present

## 2020-08-30 DIAGNOSIS — R7401 Elevation of levels of liver transaminase levels: Secondary | ICD-10-CM | POA: Diagnosis present

## 2020-08-30 DIAGNOSIS — R7989 Other specified abnormal findings of blood chemistry: Secondary | ICD-10-CM | POA: Diagnosis not present

## 2020-08-30 DIAGNOSIS — Y92230 Patient room in hospital as the place of occurrence of the external cause: Secondary | ICD-10-CM | POA: Diagnosis not present

## 2020-08-30 DIAGNOSIS — F319 Bipolar disorder, unspecified: Secondary | ICD-10-CM | POA: Diagnosis present

## 2020-08-30 DIAGNOSIS — Z86718 Personal history of other venous thrombosis and embolism: Secondary | ICD-10-CM | POA: Diagnosis not present

## 2020-08-30 DIAGNOSIS — R739 Hyperglycemia, unspecified: Secondary | ICD-10-CM | POA: Diagnosis not present

## 2020-08-30 DIAGNOSIS — Z880 Allergy status to penicillin: Secondary | ICD-10-CM | POA: Diagnosis not present

## 2020-08-30 DIAGNOSIS — K219 Gastro-esophageal reflux disease without esophagitis: Secondary | ICD-10-CM | POA: Diagnosis present

## 2020-08-30 DIAGNOSIS — J9601 Acute respiratory failure with hypoxia: Secondary | ICD-10-CM | POA: Diagnosis not present

## 2020-08-30 DIAGNOSIS — F419 Anxiety disorder, unspecified: Secondary | ICD-10-CM | POA: Diagnosis present

## 2020-08-30 DIAGNOSIS — Z8261 Family history of arthritis: Secondary | ICD-10-CM | POA: Diagnosis not present

## 2020-08-30 DIAGNOSIS — K5900 Constipation, unspecified: Secondary | ICD-10-CM | POA: Diagnosis present

## 2020-08-30 DIAGNOSIS — J1282 Pneumonia due to coronavirus disease 2019: Secondary | ICD-10-CM | POA: Diagnosis present

## 2020-08-30 DIAGNOSIS — J42 Unspecified chronic bronchitis: Secondary | ICD-10-CM | POA: Diagnosis present

## 2020-08-30 DIAGNOSIS — R609 Edema, unspecified: Secondary | ICD-10-CM | POA: Diagnosis not present

## 2020-08-30 DIAGNOSIS — D649 Anemia, unspecified: Secondary | ICD-10-CM | POA: Diagnosis present

## 2020-08-30 DIAGNOSIS — A4189 Other specified sepsis: Secondary | ICD-10-CM | POA: Diagnosis present

## 2020-08-30 LAB — MRSA PCR SCREENING: MRSA by PCR: NEGATIVE

## 2020-08-30 LAB — COMPREHENSIVE METABOLIC PANEL
ALT: 56 U/L — ABNORMAL HIGH (ref 0–44)
AST: 118 U/L — ABNORMAL HIGH (ref 15–41)
Albumin: 2.9 g/dL — ABNORMAL LOW (ref 3.5–5.0)
Alkaline Phosphatase: 72 U/L (ref 38–126)
Anion gap: 14 (ref 5–15)
BUN: 9 mg/dL (ref 8–23)
CO2: 24 mmol/L (ref 22–32)
Calcium: 8.5 mg/dL — ABNORMAL LOW (ref 8.9–10.3)
Chloride: 98 mmol/L (ref 98–111)
Creatinine, Ser: 1.12 mg/dL — ABNORMAL HIGH (ref 0.44–1.00)
GFR calc Af Amer: >60 mL/min (ref >60)
GFR calc non Af Amer: 53 mL/min — ABNORMAL LOW (ref >60)
Glucose, Bld: 221 mg/dL — ABNORMAL HIGH (ref 70–99)
Potassium: 3.9 mmol/L (ref 3.5–5.1)
Sodium: 136 mmol/L (ref 135–145)
Total Bilirubin: 0.8 mg/dL (ref 0.3–1.2)
Total Protein: 7.1 g/dL (ref 6.5–8.1)

## 2020-08-30 LAB — HEPATITIS PANEL, ACUTE
HCV Ab: NONREACTIVE
Hep A IgM: NONREACTIVE
Hep B C IgM: NONREACTIVE
Hepatitis B Surface Ag: NONREACTIVE

## 2020-08-30 LAB — SARS CORONAVIRUS 2 BY RT PCR (HOSPITAL ORDER, PERFORMED IN ~~LOC~~ HOSPITAL LAB): SARS Coronavirus 2: POSITIVE — AB

## 2020-08-30 LAB — C-REACTIVE PROTEIN: CRP: 14.6 mg/dL — ABNORMAL HIGH (ref <1.0)

## 2020-08-30 LAB — PROCALCITONIN: Procalcitonin: 0.23 ng/mL

## 2020-08-30 LAB — LACTATE DEHYDROGENASE: LDH: 724 U/L — ABNORMAL HIGH (ref 98–192)

## 2020-08-30 LAB — LACTIC ACID, PLASMA: Lactic Acid, Venous: 2.3 mmol/L (ref 0.5–1.9)

## 2020-08-30 LAB — TRIGLYCERIDES: Triglycerides: 112 mg/dL (ref <150)

## 2020-08-30 LAB — HIV ANTIBODY (ROUTINE TESTING W REFLEX): HIV Screen 4th Generation wRfx: NONREACTIVE

## 2020-08-30 LAB — FERRITIN: Ferritin: >7500 ng/mL — ABNORMAL HIGH (ref 11–307)

## 2020-08-30 MED ORDER — IOHEXOL 350 MG/ML SOLN
80.0000 mL | Freq: Once | INTRAVENOUS | Status: AC | PRN
  Administered 2020-08-30: 80 mL via INTRAVENOUS

## 2020-08-30 MED ORDER — BARICITINIB 2 MG PO TABS
4.0000 mg | ORAL_TABLET | Freq: Every day | ORAL | Status: AC
  Administered 2020-08-30 – 2020-09-12 (×14): 4 mg via ORAL
  Filled 2020-08-30 (×14): qty 2

## 2020-08-30 MED ORDER — METHYLPREDNISOLONE SODIUM SUCC 125 MG IJ SOLR: 1.0000 mg/kg | Freq: Once | INTRAMUSCULAR | Status: DC

## 2020-08-30 MED ORDER — SODIUM CHLORIDE 0.9 % IV SOLN
200.0000 mg | Freq: Once | INTRAVENOUS | Status: AC
  Administered 2020-08-30: 200 mg via INTRAVENOUS
  Filled 2020-08-30: qty 40

## 2020-08-30 MED ORDER — ENOXAPARIN SODIUM 40 MG/0.4ML ~~LOC~~ SOLN
40.0000 mg | SUBCUTANEOUS | Status: DC
  Administered 2020-08-30 – 2020-09-01 (×3): 40 mg via SUBCUTANEOUS
  Filled 2020-08-30 (×3): qty 0.4

## 2020-08-30 MED ORDER — SODIUM CHLORIDE 0.9 % IV SOLN
100.0000 mg | Freq: Every day | INTRAVENOUS | Status: AC
  Administered 2020-08-31 – 2020-09-03 (×4): 100 mg via INTRAVENOUS
  Filled 2020-08-30 (×4): qty 20

## 2020-08-30 MED ORDER — DEXAMETHASONE SODIUM PHOSPHATE 10 MG/ML IJ SOLN
10.0000 mg | Freq: Once | INTRAMUSCULAR | Status: DC
  Administered 2020-08-30: 10 mg via INTRAVENOUS
  Filled 2020-08-30: qty 1

## 2020-08-30 MED ORDER — ACETAMINOPHEN 325 MG PO TABS
650.0000 mg | ORAL_TABLET | Freq: Four times a day (QID) | ORAL | Status: DC | PRN
  Administered 2020-08-30 – 2020-09-08 (×7): 650 mg via ORAL
  Filled 2020-08-30 (×7): qty 2

## 2020-08-30 MED ORDER — METHYLPREDNISOLONE SODIUM SUCC 125 MG IJ SOLR
50.0000 mg | Freq: Once | INTRAMUSCULAR | Status: AC
  Administered 2020-08-30: 50 mg via INTRAVENOUS
  Filled 2020-08-30: qty 2

## 2020-08-30 MED ORDER — PREDNISONE 20 MG PO TABS
50.0000 mg | ORAL_TABLET | Freq: Every day | ORAL | Status: DC
  Administered 2020-09-02 – 2020-09-03 (×2): 50 mg via ORAL
  Filled 2020-08-30 (×2): qty 2

## 2020-08-30 MED ORDER — METHYLPREDNISOLONE SODIUM SUCC 125 MG IJ SOLR
1.0000 mg/kg | Freq: Two times a day (BID) | INTRAMUSCULAR | Status: AC
  Administered 2020-08-30 – 2020-09-01 (×5): 102.5 mg via INTRAVENOUS
  Filled 2020-08-30 (×5): qty 2

## 2020-08-30 MED ORDER — GUAIFENESIN-DM 100-10 MG/5ML PO SYRP
10.0000 mL | ORAL_SOLUTION | ORAL | Status: DC | PRN
  Administered 2020-08-30 – 2020-09-27 (×57): 10 mL via ORAL
  Filled 2020-08-30 (×59): qty 10

## 2020-08-30 NOTE — ED Notes (Signed)
Pt placed on heated high flow [place] by RT

## 2020-08-30 NOTE — ED Notes (Addendum)
Dr. Tarry Kos paged and made aware of pt spo2 and prone position.

## 2020-08-30 NOTE — ED Notes (Signed)
Pt transported to CT ?

## 2020-08-30 NOTE — Progress Notes (Signed)
FPTS Interim Progress Note  Received page from nurse that patient is now requiring 45 L O2 high flow nasal cannula with nonrebreather andmaintaining O2 sats of mid 80s. She appears comfortable but does exhibit tachypnea and increased work of breathing.  During conversation she had to pause multiple times to catch her breath. Discussed likely need to consult CCM and potential need for intubation if continues to worsen.  She was initially very reluctant and desires to prolong intubation as long as possible. Spoke to daughter [name] and provided update who agreed with intubation as last possible resort. During time in room patient was able to be weaned down to 40 L maintaining saturations in the mid 90s.  She was eager to self prone to help her oxygen.  Plan: -Oxygen therapy as needed -Proning at least 16 hours/day -Incentive spirometer every hour -Consult CCM for evaluation given worsening respiratory status since admission  Danna Hefty, DO 08/30/2020, 1:33 PM PGY-3, [place] Service pager [phone]

## 2020-08-30 NOTE — ED Provider Notes (Signed)
Patient signed out to me to follow-up on work-up for presumed Covid pneumonia.  Patient with several days of cough and now with severe hypoxia.  Chest x-ray showed bilateral multifocal pneumonia.  Work-up has confirmed Covid pneumonia.  Patient maintaining oxygen saturations on 12 L by facemask.  Initiated Decadron and remdesivir, will admit.  Angiocath insertion Performed by: Orpah Greek  Consent: Verbal consent obtained. Risks and benefits: risks, benefits and alternatives were discussed Time out: Immediately prior to procedure a "time out" was called to verify the correct patient, procedure, equipment, support staff and site/side marked as required.  Preparation: Patient was prepped and draped in the usual sterile fashion.  Vein Location: right antecub  Ultrasound Guided  Gauge: 20  Normal blood return and flush without difficulty Patient tolerance: Patient tolerated the procedure well with no immediate complications.      Orpah Greek, MD 08/30/20 [phone]

## 2020-08-30 NOTE — Plan of Care (Signed)
  Problem: Education: Goal: Knowledge of risk factors and measures for prevention of condition will improve Outcome: Progressing   Problem: Respiratory: Goal: Will maintain a patent airway Outcome: Progressing   Problem: Education: Goal: Knowledge of General Education information will improve Description: Including pain rating scale, medication(s)/side effects and non-pharmacologic comfort measures Outcome: Progressing   Problem: Health Behavior/Discharge Planning: Goal: Ability to manage health-related needs will improve Outcome: Progressing   Problem: Clinical Measurements: Goal: Respiratory complications will improve Outcome: Progressing   Problem: Activity: Goal: Risk for activity intolerance will decrease Outcome: Progressing   Problem: Nutrition: Goal: Adequate nutrition will be maintained Outcome: Progressing   

## 2020-08-30 NOTE — ED Notes (Signed)
Pt hypoxic when in  Prone position, but spo2 increased to 90% when placed on right side. MD aware. Dr. Tarry Kos agrees with plan to leave on HFNC and avoid prone position at this time.

## 2020-08-30 NOTE — Progress Notes (Addendum)
FPTS Interim Progress Note  Paged by nurse that patient continues to remain in mid80's on 45L HFNC and 15L NRB. She is proning with minimal improvement. She is becoming intolerant to the HFNC, limiting ability to increase to higher level.   Limited on further options. If intolerant to higher levels of HFNC, can trial BiPAP.  Order placed. Will go assess patient  Addendum: Patient satting in mid 90's while lying on right side.  Appears comfortable overall  Lungs diffusely crackly. No LE edema. Denies orthopnea however does sleep propped up   Plan: - continue lateral recumbant positioning as she seems to tolerate this well - continue HFNC and NRB as tolerated - consider BiPAP (order in) if needed - continue NPO status - follow up echo, consider low dose lasix if continues to worsen - CCM aware and following in case further deterioration.  - will have night team see patient after sign out  Danna Hefty, DO 08/30/2020, 5:30 PM PGY-3, [facility] Service pager [phone]

## 2020-08-30 NOTE — ED Notes (Signed)
High 02 at 12 liters  Nasal replaced pt placed supine for better 02

## 2020-08-30 NOTE — ED Notes (Signed)
Pt off all her 02  She wznted something to drink  She reports that she has not anything to eat or drink in 7 days

## 2020-08-30 NOTE — H&P (Addendum)
[facility] Admission History and Physical Service Pager: [phone]  Patient name: Wendy Ruiz Medical record number: [id number] Date of birth: 06/22/1956 Age: 64 y.o. Gender: female  Primary Care Provider: Lyndee Hensen, DO Consultants: None Code Status: FULL Preferred Emergency Contact: Almetta Liddicoat daughter, [phone]  Chief Complaint: shortness of breath   Assessment and Plan: Wendy Ruiz is a 64 y.o. female presenting with acute respiratory failure in the setting of COVID pneumonia . PMH is significant for chronic bronchitis   Acute on chronic respiratory failure  COVID-19 Pneumonia Patient not COVID vaccinated with known COVID exposure. Pt brought in via EMS for worsening dyspnea with O2 sats in the 50s. On admission, patient was tachypneic and oxygen sats around  40s% on RA and 93 on 15L HFNC. She is COVID positive. Admission labs: LDH 724, CRP 14.6, lactic acid 1.9, procal 0.23, ferritin >7500, D-dimer 1.58, fibrinogen 788, WBC 8.9, Hgb 11.8, Cr 1.12, GFR >60. CXR consistent with bilateral multifocal pneumonia compatible with COVID 19.  Differential includes bronchitis/COPD exacerbation given increased sputum production and cough. Patient has a history of chronic bronchitis and is former smoker. She quit smoking >25 years ago. Patient may have underlying COPD. No PFTs available for review. PE possible given stable vitals, hypoxia and tachycardia. Wells score 1.5. Patient hx history of PE or DVT and has no LE edema.  CXR not consistent with bacterial process, not starting abx at this time. As she is COVID positive, she was given first Remdesivir dose of 200mg  and 10mg  of dexamethasone. Due to patient increased work of breathing will given additional Solumedrol. Low threshold for starting baricitinib. -Admit to FMTS, attending Dr. Gwendlyn Deutscher. Level of care: progressive -Continuous cardiac monitoring and pulse ox -O2 parameters: 88-92% -AM  Labs: CBC w/diff, CMP, D-dimer, CRP, phos, mag, ferritin -Remdesivir 100mg  daily x4 days -Decadron 10mg  in ED. Will given additional 50 mg Solumedrol for first 1 mg/kg dose then Solumedrol 1mg /kg BID for 2.5 days then prednisone daily  -F/u blood cultures -Low threshold to start baricitinib: follow up acute hepatitis panel, quantiferon gold    Bipolar Disease  Stable. Previously not taking any medications for bipolar disease. - monitor mental health, consider psych consult if needed   Obesity Body mass index is 38.79 kg/m.    Social  Patient has not been seen by PCP in >5 years. She will need to re-establish care at [facility].  -Consider a1c, lipid panel    FEN/GI: NPO with plan to advance as respiratory status improves, replete electrolytes as needed Prophylaxis: Lovenox  Disposition: Admit to progressive     History of Present Illness:  Wendy Ruiz is a 64 y.o. female presenting via EMS after known COVID exposure with hypoxia.  Patient reports last Saturday she started feeling bad. She had productive cough of non-bloody sputum, myalgias and sore throat. She took her temperature but did not have a fever. T max 99.76F. Her symptoms somewhat improved. Endorses fatigue, slight headache and ongoing cough. Denies shortness of breath despite low oxygen saturations, chest pain, current myalgias or arthralgias, sore throat and changes to taste and/or smell. Patient is not vaccinated against COVID. Reports her grandson has COVID as he was diagnosed about a week ago and she has been around him.   Upon arrival pt tachycardic(HR 120) and tachypneic (RR 50's). Pt placed on 12 L HFNC.  Pt COVID positive. CXR consistent with COVID pneumonia.      Review Of Systems: Per HPI with the following  additions:   Review of Systems  Constitutional: Negative for chills and malaise/fatigue.  HENT: Positive for sore throat.   Eyes: Negative for discharge.  Respiratory: Positive for cough and sputum  production. Negative for hemoptysis.   Cardiovascular: Negative for chest pain.  Gastrointestinal: Negative for abdominal pain, constipation, diarrhea, nausea and vomiting.  Genitourinary: Negative for dysuria.  Musculoskeletal: Positive for myalgias.  Skin: Negative for rash.  Neurological: Positive for headaches. Negative for dizziness and loss of consciousness.  All other systems reviewed and are negative.   Patient Active Problem List   Diagnosis Date Noted  . Right flank pain 05/14/2013  . Chronic cough 11/15/2011  . OBESITY NOS 05/22/2007  . BIPOLAR DISORDER 02/08/2007    Past Medical History: Past Medical History:  Diagnosis Date  . Bipolar 1 disorder ([place])   . 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 Obesity     Past Surgical History: History reviewed. No pertinent surgical history.  Social History: Social History   Tobacco Use  . Smoking status: Former Research scientist (life sciences)  . Smokeless tobacco: Never Used  Substance Use Topics  . Alcohol use: No  . Drug use: No   Additional social history: Has 3 daughters Haywood Lasso and Joy. Use to smoke 1 1/2 ppd. Quit ~20 years ago. Please also refer to relevant sections of EMR.  Family History:    biological father - ?bipolar, drug addiction    biological mother - OA, cataracts    daughter - bipolar disorder    Adoptive parents deceased.    Husband died of HIV   Allergies and Medications: Allergies  Allergen Reactions  . Penicillins Rash   No current facility-administered medications on file prior to encounter.   Current Outpatient Medications on File Prior to Encounter  Medication Sig Dispense Refill  . acetic acid 2 % otic solution Place 4 drops into both ears 4 (four) times daily. 15 mL 0  . esomeprazole (NEXIUM) 40 MG capsule Take 1 capsule (40 mg total) by mouth daily. 30 capsule 0  . neomycin-polymyxin-hydrocortisone (CORTISPORIN) 3.5-10000-1 OTIC suspension Place 3 drops into both ears 4 (four) times daily. 10 mL 0     Objective: BP 98/65   Pulse (!) 105   Temp (!) 97.3 F (36.3 C) (Temporal)   Resp (!) 48   Ht 5\' 4"  (1.626 m)   Wt 102.5 kg   SpO2 95%   BMI 38.79 kg/m   Exam: GEN:     alert, well developed female and no acute distress    HENT:    nares patent, no nasal discharge  EYES:   pupils equal and reactive, EOM intact NECK:  supple, normal ROM RESP:   Pt prone, tachypnic, good air movement, no rales, no wheezes, no rhonchi, wearing 15 L HFNC CVS:    Tachycardic, regular rhythm, distal pulses intact  ABD:  soft, non-tender; bowel sounds present; no palpable masses EXT:   normal ROM, atraumatic, no extremity edema  NEURO:  normal without focal findings,  speech normal, alert  Skin:   warm and dry, no rash, normal skin turgor Psych: Normal affect, appropriate speech and behavior    Labs and Imaging: CBC BMET  Recent Labs  Lab 08/29/20 2248  WBC 8.9  HGB 11.8*  HCT 38.4  PLT 210   Recent Labs  Lab 08/29/20 2248  NA 136  K 3.9  CL 98  CO2 24  BUN 9  CREATININE 1.12*  GLUCOSE 221*  CALCIUM 8.5*  EKG: HR 118, sinus tachycardia  DG Chest Port 1 View  Result Date: 08/29/2020 CLINICAL DATA:  COVID-19 positive, hypoxia, cough EXAM: PORTABLE CHEST 1 VIEW COMPARISON:  01/12/2018 FINDINGS: Single frontal view of the chest demonstrates unremarkable cardiac silhouette. There is widespread bilateral airspace disease, without effusion or pneumothorax. No acute bony abnormality. IMPRESSION: 1. Bilateral multifocal pneumonia compatible with COVID 19. Electronically Signed   By: Randa Ngo M.D.   On: 08/29/2020 22:54     Lyndee Hensen, DO 08/30/2020, 3:12 AM PGY-2, [place] pager: [phone], text pages welcome

## 2020-08-30 NOTE — Consult Note (Signed)
NAME:  Wendy Ruiz, MRN:  [id number], DOB:  [birth year], LOS: 0 ADMISSION DATE:  08/29/2020, CONSULTATION DATE: 08/30/20 REFERRING MD:  Kinnie Feil, MD CHIEF COMPLAINT: Shortness of breath  Brief History   64 year old woman feeling ill for the last week to week and a half admitted with acute hypoxic respiratory failure with ARDS due to COVID-19 pneumonia whom we are consulted for severe hypoxemia.  History of present illness   Patient noted feeling ill a week to week and a half ago.  Malaise, fever.  Significant fatigue.  Noted some labored breathing particularly when lying flat.  Also more labored when walking around the house.  Children have been coming to the house to check on her as she is not been feeling well.  She went to sleep last night and awoke to EMS in her room.  Sounds like she had episode of syncope and did not hear his children banging on the door.  Therefore rescue squad was called.  She was transported to the ED.  Chest x-ray with bilateral infiltrates.  Severely hypoxemic requiring nonrebreather.  Desaturation with movement when lying flat.  CTA performed which showed near confluent dense groundglass opacities throughout all lung fields.  No PE.  Oxygenation and respiratory rate seems to improved with the addition of high flow nasal cannula to nonrebreather.  She feels more comfortable sitting upright in the chair as opposed to laying in stretcher.  Did try to prone for a little bit but felt her respiratory distress worsened and her oxygen saturation worsened per RN.  Past Medical History  GERD  Significant Hospital Events   9/19 admitted  Consults:  PCCM  Procedures:  N/A  Significant Diagnostic Tests:  CTA 9/19 with no PE, near confluent dense groundglass opacities in all lobes of bilateral lungs  Micro Data:  Pending  Antimicrobials:  Remdesivir 9/19  Interim history/subjective:  As above  Objective   Blood pressure 120/80, pulse 93, temperature  98.3 F (36.8 C), temperature source Oral, resp. rate 20, height 5\' 4"  (1.626 m), weight 102.5 kg, SpO2 94 %.    FiO2 (%):  [0 %] 0 %  No intake or output data in the 24 hours ending 08/30/20 1445 Filed Weights   08/29/20 2226  Weight: 102.5 kg    Examination: General: Reclining in chair, in no acute distress Pulmonary: Normal work of breathing on nonrebreather and high flow nasal cannula, oxygen saturations in high 90s during conversation Abdomen: Obese, nondistended, bowel is present Extremities: Warm, no edema  Resolved Hospital Problem list   n/a  Assessment & Plan:  Acute hypoxemic respiratory failure due to ARDS in setting of COVID-19 pneumonia: Patient appears comfortable, sats in mid to upper 90s when speaking in full sentences on nonrebreather and 100% high flow nasal cannula.  CT scan with confluent groundglass with more dense infiltrate scattered throughout the entire lung.  We will continue supplemental oxygen as tolerated.  Encourage proning. --Continue baricitinib, steroids, remdesivir --If oxygen saturation start to drop, consider increasing high flow nasal cannula flow rate to 60 L, consider short trial of BiPAP --Patient did not appear to be volume overloaded, endorses some orthopnea, suspect related to habitus, however will order TTE to evaluate for structural heart disease  We will continue to follow.  Best practice:  Per primary  Labs   CBC: Recent Labs  Lab 08/29/20 2248  WBC 8.9  NEUTROABS 7.8*  HGB 11.8*  HCT 38.4  MCV 85.1  PLT 210  Basic Metabolic Panel: Recent Labs  Lab 08/29/20 2248  NA 136  K 3.9  CL 98  CO2 24  GLUCOSE 221*  BUN 9  CREATININE 1.12*  CALCIUM 8.5*   GFR: Estimated Creatinine Clearance: 60.7 mL/min (A) (by C-G formula based on SCr of 1.12 mg/dL (H)). Recent Labs  Lab 08/29/20 2247 08/29/20 2248 08/30/20 0525  PROCALCITON  --  0.23  --   WBC  --  8.9  --   LATICACIDVEN 1.9  --  2.3*    Liver Function  Tests: Recent Labs  Lab 08/29/20 2248  AST 118*  ALT 56*  ALKPHOS 72  BILITOT 0.8  PROT 7.1  ALBUMIN 2.9*   No results for input(s): LIPASE, AMYLASE in the last 168 hours. No results for input(s): AMMONIA in the last 168 hours.  ABG No results found for: PHART, PCO2ART, PO2ART, HCO3, TCO2, ACIDBASEDEF, O2SAT   Coagulation Profile: No results for input(s): INR, PROTIME in the last 168 hours.  Cardiac Enzymes: No results for input(s): CKTOTAL, CKMB, CKMBINDEX, TROPONINI in the last 168 hours.  HbA1C: No results found for: HGBA1C  CBG: No results for input(s): GLUCAP in the last 168 hours.  Review of Systems:   No chest pain. Endorses orthopnea.  Comprehensive review of systems otherwise negative.  Past Medical History  She,  has a past medical history of Bipolar 1 disorder ([place]), Chronic pain due to trauma, and Obesity.   Surgical History   History reviewed. No pertinent surgical history.   Social History   reports that she has quit smoking. She has never used smokeless tobacco. She reports that she does not drink alcohol and does not use drugs.   Family History   Her family history is not on file.   Allergies Allergies  Allergen Reactions  . Penicillins Rash     Home Medications  Prior to Admission medications   Medication Sig Start Date End Date Taking? Authorizing Provider  acetic acid 2 % otic solution Place 4 drops into both ears 4 (four) times daily. Patient not taking: Reported on 08/30/2020 01/23/19   Wieters, Hallie C, PA-C  esomeprazole (NEXIUM) 40 MG capsule Take 1 capsule (40 mg total) by mouth daily. Patient not taking: Reported on 08/30/2020 11/28/17   Robyn Haber, MD  neomycin-polymyxin-hydrocortisone (CORTISPORIN) 3.5-10000-1 OTIC suspension Place 3 drops into both ears 4 (four) times daily. Patient not taking: Reported on 08/30/2020 01/20/19   Janith Lima, PA-C     Critical care time: CRITICAL CARE Performed by: Lanier Clam   Total critical care time: 40 minutes  Critical care time was exclusive of separately billable procedures and treating other patients.  Critical care was necessary to treat or prevent imminent or life-threatening deterioration.  Critical care was time spent personally by me on the following activities: development of treatment plan with patient and/or surrogate as well as nursing, discussions with consultants, evaluation of patient's response to treatment, examination of patient, obtaining history from patient or surrogate, ordering and performing treatments and interventions, ordering and review of laboratory studies, ordering and review of radiographic studies, pulse oximetry and re-evaluation of patient's condition.

## 2020-08-30 NOTE — ED Notes (Signed)
Admitting team paged and made aware of need for high flow 02. Team states they will be down to see her soon.

## 2020-08-30 NOTE — ED Notes (Signed)
During CT/ Transport pat placed on 15LPM NRB. Pt initially tolerated well, but while laying flat during CT spO2 decreased to 48% on 15LPM [place]. Upon returning to upright position and returning pt to HFNC at 15LPM and NRB at 15LPM pt spo2 steadily increased to 84% over ~ 10 minutes. During periods of severe hypoxia pt did not appear to be in distress and was able to speak in short sentences.

## 2020-08-30 NOTE — ED Notes (Signed)
Pt placed on high flow o 02 at 10 liters

## 2020-08-30 NOTE — ED Notes (Signed)
Pt assisted to bedside commode and then into the recliner. SpO2 decreased to 79% on 45L HFNC and 15L NRB, but increased once at rest. Pt appeared to tolerate without significant distress.

## 2020-08-30 NOTE — ED Notes (Signed)
Pt placed in prone position- RT at bedside assisting with positioning.

## 2020-08-30 NOTE — ED Notes (Signed)
Admitting team at bedside.

## 2020-08-31 ENCOUNTER — Inpatient Hospital Stay (HOSPITAL_COMMUNITY): Payer: HRSA Program

## 2020-08-31 DIAGNOSIS — J9601 Acute respiratory failure with hypoxia: Secondary | ICD-10-CM | POA: Diagnosis not present

## 2020-08-31 DIAGNOSIS — U071 COVID-19: Secondary | ICD-10-CM | POA: Diagnosis not present

## 2020-08-31 DIAGNOSIS — R05 Cough: Secondary | ICD-10-CM | POA: Diagnosis not present

## 2020-08-31 DIAGNOSIS — J1282 Pneumonia due to coronavirus disease 2019: Secondary | ICD-10-CM | POA: Diagnosis not present

## 2020-08-31 LAB — CBC WITH DIFFERENTIAL/PLATELET
Abs Immature Granulocytes: 0.03 10*3/uL (ref 0.00–0.07)
Basophils Absolute: 0 10*3/uL (ref 0.0–0.1)
Basophils Relative: 0 %
Eosinophils Absolute: 0 10*3/uL (ref 0.0–0.5)
Eosinophils Relative: 0 %
HCT: 38.1 % (ref 36.0–46.0)
Hemoglobin: 11.6 g/dL — ABNORMAL LOW (ref 12.0–15.0)
Immature Granulocytes: 0 %
Lymphocytes Relative: 14 %
Lymphs Abs: 1.1 10*3/uL (ref 0.7–4.0)
MCH: 26.5 pg (ref 26.0–34.0)
MCHC: 30.4 g/dL (ref 30.0–36.0)
MCV: 87.2 fL (ref 80.0–100.0)
Monocytes Absolute: 0.5 10*3/uL (ref 0.1–1.0)
Monocytes Relative: 6 %
Neutro Abs: 6.5 10*3/uL (ref 1.7–7.7)
Neutrophils Relative %: 80 %
Platelets: 260 10*3/uL (ref 150–400)
RBC: 4.37 MIL/uL (ref 3.87–5.11)
RDW: 12.5 % (ref 11.5–15.5)
WBC: 8.1 10*3/uL (ref 4.0–10.5)
nRBC: 0.2 % (ref 0.0–0.2)

## 2020-08-31 LAB — PHOSPHORUS: Phosphorus: 2.6 mg/dL (ref 2.5–4.6)

## 2020-08-31 LAB — COMPREHENSIVE METABOLIC PANEL
ALT: 60 U/L — ABNORMAL HIGH (ref 0–44)
AST: 104 U/L — ABNORMAL HIGH (ref 15–41)
Albumin: 2.4 g/dL — ABNORMAL LOW (ref 3.5–5.0)
Alkaline Phosphatase: 66 U/L (ref 38–126)
Anion gap: 9 (ref 5–15)
BUN: 13 mg/dL (ref 8–23)
CO2: 25 mmol/L (ref 22–32)
Calcium: 8.4 mg/dL — ABNORMAL LOW (ref 8.9–10.3)
Chloride: 104 mmol/L (ref 98–111)
Creatinine, Ser: 0.89 mg/dL (ref 0.44–1.00)
GFR calc Af Amer: >60 mL/min (ref >60)
GFR calc non Af Amer: >60 mL/min (ref >60)
Glucose, Bld: 188 mg/dL — ABNORMAL HIGH (ref 70–99)
Potassium: 4 mmol/L (ref 3.5–5.1)
Sodium: 138 mmol/L (ref 135–145)
Total Bilirubin: 0.8 mg/dL (ref 0.3–1.2)
Total Protein: 6.4 g/dL — ABNORMAL LOW (ref 6.5–8.1)

## 2020-08-31 LAB — C-REACTIVE PROTEIN: CRP: 10.3 mg/dL — ABNORMAL HIGH (ref <1.0)

## 2020-08-31 LAB — HEMOGLOBIN A1C
Hgb A1c MFr Bld: 5.7 % — ABNORMAL HIGH (ref 4.8–5.6)
Mean Plasma Glucose: 116.89 mg/dL

## 2020-08-31 LAB — D-DIMER, QUANTITATIVE: D-Dimer, Quant: 1.87 ug/mL-FEU — ABNORMAL HIGH (ref 0.00–0.50)

## 2020-08-31 LAB — TROPONIN I (HIGH SENSITIVITY): Troponin I (High Sensitivity): 32 ng/L — ABNORMAL HIGH (ref <18)

## 2020-08-31 LAB — MAGNESIUM: Magnesium: 2.5 mg/dL — ABNORMAL HIGH (ref 1.7–2.4)

## 2020-08-31 MED ORDER — LIP MEDEX EX OINT
TOPICAL_OINTMENT | CUTANEOUS | Status: DC | PRN
  Filled 2020-08-31: qty 7

## 2020-08-31 MED ORDER — BENZONATATE 100 MG PO CAPS
200.0000 mg | ORAL_CAPSULE | Freq: Three times a day (TID) | ORAL | Status: DC | PRN
  Administered 2020-08-31: 200 mg via ORAL
  Filled 2020-08-31: qty 2

## 2020-08-31 MED ORDER — ENSURE ENLIVE PO LIQD
237.0000 mL | Freq: Three times a day (TID) | ORAL | Status: DC
  Administered 2020-08-31 – 2020-09-25 (×47): 237 mL via ORAL

## 2020-08-31 MED ORDER — HYDROXYZINE HCL 10 MG PO TABS
10.0000 mg | ORAL_TABLET | Freq: Three times a day (TID) | ORAL | Status: DC | PRN
  Administered 2020-08-31 – 2020-09-03 (×2): 10 mg via ORAL
  Filled 2020-08-31 (×3): qty 1

## 2020-08-31 MED ORDER — SALINE SPRAY 0.65 % NA SOLN
1.0000 | NASAL | Status: DC | PRN
  Administered 2020-08-31 – 2020-09-02 (×3): 1 via NASAL
  Filled 2020-08-31: qty 44

## 2020-08-31 NOTE — Progress Notes (Signed)
   08/31/20 0800  Assess: MEWS Score  Temp 98.4 F (36.9 C)  BP 116/82  Pulse Rate 98  ECG Heart Rate 98  Resp (!) 50  SpO2 (!) 85 %  O2 Device HFNC;Non-rebreather Mask  O2 Flow Rate (L/min) 35 L/min  FiO2 (%) 100 %  Assess: MEWS Score  MEWS Temp 0  MEWS Systolic 0  MEWS Pulse 0  MEWS RR 3  MEWS LOC 0  MEWS Score 3  MEWS Score Color Yellow  Assess: if the MEWS score is Yellow or Red  Were vital signs taken at a resting state? Yes  Focused Assessment No change from prior assessment  Early Detection of Sepsis Score *See Row Information* Low  MEWS guidelines implemented *See Row Information* Yes  Treat  MEWS Interventions Administered scheduled meds/treatments  Take Vital Signs  Increase Vital Sign Frequency  Yellow: Q 2hr X 2 then Q 4hr X 2, if remains yellow, continue Q 4hrs  Escalate  MEWS: Escalate Yellow: discuss with charge nurse/RN and consider discussing with provider and RRT  Notify: Charge Nurse/RN  Name of Charge Nurse/RN Notified Kristin, RN  Date Charge Nurse/RN Notified 09/01/20  Time Charge Nurse/RN Notified 1100

## 2020-08-31 NOTE — Progress Notes (Signed)
NAME:  Wendy Ruiz, MRN:  [id number], DOB:  09/14/1956, LOS: 1 ADMISSION DATE:  08/29/2020, CONSULTATION DATE: 08/31/20 REFERRING MD:  Kinnie Feil, MD CHIEF COMPLAINT: Shortness of breath  Brief History   64 year old woman feeling ill for the last week to week and a half admitted with acute hypoxic respiratory failure with ARDS due to COVID-19 pneumonia whom we are consulted for severe hypoxemia.  History of present illness   Patient noted feeling ill a week to week and a half ago.  Malaise, fever.  Significant fatigue.  Noted some labored breathing particularly when lying flat.  Also more labored when walking around the house.  Children have been coming to the house to check on her as she is not been feeling well.  She went to sleep last night and awoke to EMS in her room.  Sounds like she had episode of syncope and did not hear his children banging on the door.  Therefore rescue squad was called.  She was transported to the ED.  Chest x-ray with bilateral infiltrates.  Severely hypoxemic requiring nonrebreather.  Desaturation with movement when lying flat.  CTA performed which showed near confluent dense groundglass opacities throughout all lung fields.  No PE.  Oxygenation and respiratory rate seems to improved with the addition of high flow nasal cannula to nonrebreather.  She feels more comfortable sitting upright in the chair as opposed to laying in stretcher.  Did try to prone for a little bit but felt her respiratory distress worsened and her oxygen saturation worsened per RN.  Past Medical History  GERD  Significant Hospital Events   9/19 admitted  Consults:  PCCM  Procedures:  N/A  Significant Diagnostic Tests:  CTA 9/19 with no PE, near confluent dense groundglass opacities in all lobes of bilateral lungs  Micro Data:  Pending  Antimicrobials:  Remdesivir 9/19  Interim history/subjective:  HFNC increased from 35 to 40LPM due to low sats. Patient has no  complaints and is breathing comfortably with this change. Productive cough ongoing.   Objective   Blood pressure 116/80, pulse 100, temperature 98.8 F (37.1 C), temperature source Oral, resp. rate (!) 26, height 5\' 4"  (1.626 m), weight 110.5 kg, SpO2 94 %.    FiO2 (%):  [100 %] 100 %   Intake/Output Summary (Last 24 hours) at 08/31/2020 1218 Last data filed at 08/31/2020 0900 Gross per 24 hour  Intake 360 ml  Output 500 ml  Net -140 ml   Filed Weights   08/29/20 2226 08/30/20 2045  Weight: 102.5 kg 110.5 kg    Examination: General: Adult female in bedside chair in no distress.  Pulmonary: No distress on 40LPM HFNC and NRB mask. sats in low 90s. No accessory muscle use. Speaking full sentences.  Abdomen: Soft, non-distended Extremities: Warm, no edema.   Resolved Hospital Problem list   n/a  Assessment & Plan:  Acute hypoxemic respiratory failure due to ARDS in setting of COVID-19 pneumonia: Patient appears comfortable, sats in low 90s when speaking in full sentences on nonrebreather and 100% high flow nasal cannula.  CT scan with confluent groundglass with more dense infiltrate scattered throughout the entire lung.  We will continue supplemental oxygen as tolerated.  Encourage proning. --Continue baricitinib, steroids, remdesivir as you are doing.  --Should she begin to require more oxygen reasonable to increse FiO2 as high as 60 LPM, at which point it is reasonable to consider BiPAP and/or intubation and re-involve PCCM.   --Patient did not appear  to be volume overloaded, endorses some orthopnea, suspect related to habitus. Echocardiogram pending. Depending on results she may benefit from diuresis.  -- If she should grow markedly dyspneic and/or develop alteration of mental status please re-consult.   PCCM will sign off.    Best practice:  Per primary  Labs   CBC: Recent Labs  Lab 08/29/20 2248 08/31/20 0518  WBC 8.9 8.1  NEUTROABS 7.8* 6.5  HGB 11.8* 11.6*  HCT  38.4 38.1  MCV 85.1 87.2  PLT 210 956    Basic Metabolic Panel: Recent Labs  Lab 08/29/20 2248 08/31/20 0518  NA 136 138  K 3.9 4.0  CL 98 104  CO2 24 25  GLUCOSE 221* 188*  BUN 9 13  CREATININE 1.12* 0.89  CALCIUM 8.5* 8.4*  MG  --  2.5*  PHOS  --  2.6   GFR: Estimated Creatinine Clearance: 79.7 mL/min (by C-G formula based on SCr of 0.89 mg/dL). Recent Labs  Lab 08/29/20 2247 08/29/20 2248 08/30/20 0525 08/31/20 0518  PROCALCITON  --  0.23  --   --   WBC  --  8.9  --  8.1  LATICACIDVEN 1.9  --  2.3*  --     Liver Function Tests: Recent Labs  Lab 08/29/20 2248 08/31/20 0518  AST 118* 104*  ALT 56* 60*  ALKPHOS 72 66  BILITOT 0.8 0.8  PROT 7.1 6.4*  ALBUMIN 2.9* 2.4*   No results for input(s): LIPASE, AMYLASE in the last 168 hours. No results for input(s): AMMONIA in the last 168 hours.  ABG No results found for: PHART, PCO2ART, PO2ART, HCO3, TCO2, ACIDBASEDEF, O2SAT   Coagulation Profile: No results for input(s): INR, PROTIME in the last 168 hours.  Cardiac Enzymes: No results for input(s): CKTOTAL, CKMB, CKMBINDEX, TROPONINI in the last 168 hours.  HbA1C: No results found for: HGBA1C  CBG: No results for input(s): GLUCAP in the last 168 hours.  Review of Systems:   No chest pain. Endorses orthopnea.  Comprehensive review of systems otherwise negative.  Past Medical History  She,  has a past medical history of Bipolar 1 disorder ([place]), Chronic pain due to trauma, and Obesity.   Surgical History   History reviewed. No pertinent surgical history.   Social History   reports that she has quit smoking. She has never used smokeless tobacco. She reports that she does not drink alcohol and does not use drugs.   Family History   Her family history is not on file.   Allergies Allergies  Allergen Reactions  . Penicillins Rash     Home Medications  Prior to Admission medications   Medication Sig Start Date End Date Taking? Authorizing  Provider  acetic acid 2 % otic solution Place 4 drops into both ears 4 (four) times daily. Patient not taking: Reported on 08/30/2020 01/23/19   Wieters, Hallie C, PA-C  esomeprazole (NEXIUM) 40 MG capsule Take 1 capsule (40 mg total) by mouth daily. Patient not taking: Reported on 08/30/2020 11/28/17   Robyn Haber, MD  neomycin-polymyxin-hydrocortisone (CORTISPORIN) 3.5-10000-1 OTIC suspension Place 3 drops into both ears 4 (four) times daily. Patient not taking: Reported on 08/30/2020 01/20/19   Janith Lima, PA-C      Georgann Housekeeper, AGACNP-BC [facility] for personal pager PCCM on call pager [phone]  08/31/2020 12:23 PM

## 2020-08-31 NOTE — CV Procedure (Signed)
Echocardogram attempted but patient wanted to talk with MD before having test performed. Will try again later.

## 2020-08-31 NOTE — Progress Notes (Signed)
Spoke with patient about echo and she said she is willing to have one done. Attempted to call echo x 2 to inform them but no answer. Oncoming nurse aware.

## 2020-08-31 NOTE — Progress Notes (Signed)
Family Medicine Teaching Service Daily Progress Note Intern Pager: [phone]  Patient name: Wendy Ruiz Medical record number: [id number] Date of birth: 08/19/1956 Age: 64 y.o. Gender: female  Primary Care Provider: Lyndee Hensen, DO Consultants:  Code Status: Full  Pt Overview and Major Events to Date:  9/19: Admission  Assessment and Plan:  Acute hypoxic respiratory failure secondary to COVID-19   sepsis Patient currently on 35 L HFNC with oxygen saturations at 93%.  On admission patient met SIRS criteria with a temperature of 102.4 elevated lactic acid to 2.3 and respiratory rate of 33-54 heart rate tachycardic source infection being Covid pneumonia. Trending labs: LDH 724, Ferritin >7500, CRP 14.6>10.3, Procal 0.23, LA 2.3, D-dimer 1.58>1.87.  Hepatitis panel nonreactive -Continue high-dose Solu-Medrol for total of 3 days then transition to p.o. some 50 mg daily x14 days -Tylenol as needed for fever -Remdesivir (9-19-) -Baricitinib (9/19-) continue x14 days or until discharge -Continuous oxygen: Parameters 88 to 92% -Follow-up QuantiFERON gold -Tessalon Perles and Robitussin for cough as needed  Hyperglycemia, acute versus chronic Glucose on arrival was 221, today was 188 has not seen a primary care physician in over 5 years and needs reestablishment.  Unsure if chronically hyperglycemic.  Consider getting lipid panel. -SSI -CBG monitoring -Obtain A1c today  Acute kidney disease, in the setting of Covid pneumonia, improving On admission creatinine was mildly elevated to 1.12, today is 0.89. -Oral hydration as tolerated, consider maintenance IV fluid if worsening renal function -Continue to monitor  Mild transaminitis Common finding and COVID-19 infections.  No GI symptoms at the moment. Continue to monitor closely.  AST/ALT: 118/56 > 104/60 -Follow-up hepatitis panel   Bipolar disease Stable.  Previously nothing needed medications regular disease. -Monitor  mental health, can consider psych consult if concerned  Obesity Body mass index is 38.79    FEN/GI: NPO, advance with respiratory status improvement.  Replete electrolytes as necessary PPx: Lovenox   Status is: Inpatient  Remains inpatient appropriate because:Inpatient level of care appropriate due to severity of illness   Dispo: The patient is from: Home              Anticipated d/c is to: Home              Anticipated d/c date is: > 3 days              Patient currently is not medically stable to d/c.   Subjective:  Patient words that she is still having trouble with breathing, she said that sitting in the recliner previously has helped.  Patient wants to know when she is able to eat although she seems to understand that breathing is the priority at this moment.  Patient does have a dry cough and says that it comes on more frequently after she has to take her pills which she is requesting cough medication.  Objective: Temp:  [98.3 F (36.8 C)-102.4 F (39.1 C)] 99.2 F (37.3 C) (09/20 0416) Pulse Rate:  [88-113] 97 (09/20 0416) Resp:  [12-49] 24 (09/20 0416) BP: (91-122)/(55-83) 111/55 (09/20 0416) SpO2:  [48 %-97 %] 93 % (09/20 0619) FiO2 (%):  [100 %] 100 % (09/20 0619) Weight:  [110.5 kg] 110.5 kg (09/19 2045) Physical Exam: General: Mild respiratory distress, supine in bed Cardiovascular: RRR, no M/R/G appreciated.  The difficult to auscultate with tachypnea and high flow nasal cannula Respiratory: Currently on 35 L HFNC, tachypneic, able to finish short sentences, has drops in saturations while speaking Abdomen: Nontender, nondistended, soft  Extremities: Tenuous movement of all extremities  Laboratory: Recent Labs  Lab 08/29/20 2248 08/31/20 0518  WBC 8.9 8.1  HGB 11.8* 11.6*  HCT 38.4 38.1  PLT 210 260   Recent Labs  Lab 08/29/20 2248 08/31/20 0518  NA 136 138  K 3.9 4.0  CL 98 104  CO2 24 25  BUN 9 13  CREATININE 1.12* 0.89  CALCIUM 8.5* 8.4*   PROT 7.1 6.4*  BILITOT 0.8 0.8  ALKPHOS 72 66  ALT 56* 60*  AST 118* 104*  GLUCOSE 221* 188*     Imaging/Diagnostic Tests: CT ANGIO CHEST PE W OR WO CONTRAST  Result Date: 08/30/2020 CLINICAL DATA:  COVID-19 infection, now with cough, fever and shortness of breath for the past 2 days. Evaluate for pulmonary embolism. EXAM: CT ANGIOGRAPHY CHEST WITH CONTRAST TECHNIQUE: Multidetector CT imaging of the chest was performed using the standard protocol during bolus administration of intravenous contrast. Multiplanar CT image reconstructions and MIPs were obtained to evaluate the vascular anatomy. CONTRAST:  70m OMNIPAQUE IOHEXOL 350 MG/ML SOLN COMPARISON:  Chest CT-08/29/2020; 01/12/2018; 05/24/2017 FINDINGS: Vascular Findings: There is suboptimal opacification of the pulmonary arterial system with the main pulmonary artery measuring only 195 Hounsfield units. Given this limitation, there are no discrete filling defects within the pulmonary arterial tree to the level the bilateral subsegmental pulmonary arteries. Evaluation of the distal subsegmental pulmonary arteries is degraded secondary to a combination of suboptimal vessel opacification as well as patient respiratory artifact. Normal caliber of the main pulmonary artery. Cardiomegaly.  No pericardial effusion. Mild ectasia of the ascending thoracic aorta measuring 38 mm in diameter. Conventional configuration of the aortic arch. The descending thoracic aorta is of normal caliber and widely patent without hemodynamically significant narrowing. There is a minimal amount of atherosclerotic plaque involving the aortic arch and proximal descending thoracic aorta, not resulting in hemodynamically significant stenosis. Review of the MIP images confirms the above findings. ---------------------------------------------------------------------------------- Nonvascular Findings: Mediastinum/Lymph Nodes: Scattered prominent mediastinal and hilar lymph nodes are  not enlarged by size criteria with index AP window lymph node measuring 0.9 cm in greatest short axis diameter (image 33, series 5) and index left infrahilar lymph node measuring 0.8 cm (image 43, series 5), presumably reactive in etiology. No bulky mediastinal, hilar or axillary lymphadenopathy. Lungs/Pleura: Extensive bilateral heterogeneous and consolidative airspace opacities involving near the entirety of the lungs bilaterally with associated scattered air bronchograms. No pleural effusion or pneumothorax. The central pulmonary airways appear widely patent. Upper abdomen: Limited arterial phase of the upper abdomen demonstrates multiple cholesterol containing gallstones within an otherwise normal-appearing gallbladder. Musculoskeletal: No acute or aggressive osseous abnormalities. Stigmata of dish within the thoracic spine. Regional soft tissues appear normal. Normal appearance of the thyroid gland. IMPRESSION: 1. No evidence of pulmonary embolism to the level the bilateral subsegmental pulmonary arteries. 2. Extensive heterogeneous and consolidative airspace opacities involving near the entirety of the lungs bilaterally compatible with provided history of COVID 19 pneumonia. 3. Cardiomegaly. 4. Aortic Atherosclerosis (ICD10-I70.0). 5. Cholelithiasis. Electronically Signed   By: JSandi MariscalM.D.   On: 08/30/2020 09:00     LRise Patience DO 08/31/2020, 6:30 AM PGY-1, [facility] pager: [phone] text pages welcome

## 2020-08-31 NOTE — Progress Notes (Signed)
FPTS Interim Progress Note  S: 1 4 nightly around on patient.  She is resting in bed comfortably.  She is on 35 L nasal cannula and reports her work of breathing may have slightly improved.  She says that they cut down her O2.  Denies any chest pain.  Reports that she feels like she breathes rapidly when she is sitting by herself but is able to take deeper breaths when she is talking with someone.  Her oxygen saturation improved from 94% to 98% while talking with me.  O: BP 102/67 (BP Location: Right Arm)    Pulse 98    Temp 98.1 F (36.7 C) (Axillary)    Resp (!) 30    Ht 5\' 4"  (1.626 m)    Wt 110.5 kg    SpO2 91%    BMI 41.82 kg/m   General: Alert, resting comfortably, mildly tachypneic when I enter the room but her respirations slowed down with conversation Respiratory: Increased work of breathing, tachypnea in the low 30s when I enter the room but improved to low 20s while having a discussion with her.  Her lungs are clear to auscultation bilaterally and she is moving air well.  Home 30 L/min high flow nasal cannula and nonrebreather mask.  O2 saturations are in the low 90s initially but improved to the high 90s. Abdomen: Soft, nontender  A/P: Acute hypoxic respiratory failure 2/2 COVID-19 Patient currently on 30 L high flow nasal cannula plus nonrebreather with oxygen saturation ranging from 93-98%.  Patient reports she feels her work of breathing is slightly improved. -Continue high-dose Solu-Medrol for a total duration of 3 days and then she will transition to 50 mg p.o. daily for 14 days -Tylenol as needed for pain -Remdesivir -Continue baricitinib -O2 parameters above 88% -Tessalon Perles and Robitussin for cough as needed  Gifford Shave, MD 08/31/2020, 8:23 PM PGY-2, [facility] pager [phone]

## 2020-08-31 NOTE — Progress Notes (Signed)
FPTS Interim Progress Note  Went to see patient this morning for AM check. She is tachypneic but otherwise comfortable on 35L HFNC with NRB. She does occasionally drop in to mid 80's but improves with time. She does have labored breathing when conversing however does not appear to be fatigued at this time. Patient is very motivated to continue supportive care as long as possible before moving toward intubation. She does request a diet and something for her dry cough.  Plan: - continue supportive care and oxygen therapy - educated patient on self proning while sitting in chair + lateral recumbent positioning - Robitussin DM / Tessalon pearls PRN for cough - Full liquid diet + Ensure as tolerated. Recommend nurse accompany patient to ensure toleration - continue to monitor closely  Danna Hefty, DO 08/31/2020, 8:30 AM PGY-3, [facility] pager [phone]

## 2020-08-31 NOTE — Plan of Care (Signed)
°  Problem: Education: Goal: Knowledge of risk factors and measures for prevention of condition will improve Outcome: Progressing   Problem: Respiratory: Goal: Will maintain a patent airway Outcome: Progressing   Problem: Education: Goal: Knowledge of General Education information will improve Description: Including pain rating scale, medication(s)/side effects and non-pharmacologic comfort measures Outcome: Progressing   Problem: Health Behavior/Discharge Planning: Goal: Ability to manage health-related needs will improve Outcome: Progressing   Problem: Clinical Measurements: Goal: Respiratory complications will improve Outcome: Progressing   Problem: Activity: Goal: Risk for activity intolerance will decrease Outcome: Progressing   Problem: Nutrition: Goal: Adequate nutrition will be maintained Outcome: Progressing   

## 2020-09-01 ENCOUNTER — Inpatient Hospital Stay (HOSPITAL_COMMUNITY): Payer: HRSA Program

## 2020-09-01 DIAGNOSIS — R739 Hyperglycemia, unspecified: Secondary | ICD-10-CM

## 2020-09-01 DIAGNOSIS — R0602 Shortness of breath: Secondary | ICD-10-CM

## 2020-09-01 DIAGNOSIS — U071 COVID-19: Principal | ICD-10-CM

## 2020-09-01 DIAGNOSIS — J1282 Pneumonia due to coronavirus disease 2019: Secondary | ICD-10-CM

## 2020-09-01 DIAGNOSIS — J9601 Acute respiratory failure with hypoxia: Secondary | ICD-10-CM

## 2020-09-01 LAB — D-DIMER, QUANTITATIVE: D-Dimer, Quant: 3.42 ug/mL-FEU — ABNORMAL HIGH (ref 0.00–0.50)

## 2020-09-01 LAB — COMPREHENSIVE METABOLIC PANEL
ALT: 60 U/L — ABNORMAL HIGH (ref 0–44)
AST: 71 U/L — ABNORMAL HIGH (ref 15–41)
Albumin: 2.6 g/dL — ABNORMAL LOW (ref 3.5–5.0)
Alkaline Phosphatase: 79 U/L (ref 38–126)
Anion gap: 11 (ref 5–15)
BUN: 17 mg/dL (ref 8–23)
CO2: 23 mmol/L (ref 22–32)
Calcium: 8.6 mg/dL — ABNORMAL LOW (ref 8.9–10.3)
Chloride: 102 mmol/L (ref 98–111)
Creatinine, Ser: 0.83 mg/dL (ref 0.44–1.00)
GFR calc Af Amer: >60 mL/min (ref >60)
GFR calc non Af Amer: >60 mL/min (ref >60)
Glucose, Bld: 243 mg/dL — ABNORMAL HIGH (ref 70–99)
Potassium: 4.3 mmol/L (ref 3.5–5.1)
Sodium: 136 mmol/L (ref 135–145)
Total Bilirubin: 0.7 mg/dL (ref 0.3–1.2)
Total Protein: 6.3 g/dL — ABNORMAL LOW (ref 6.5–8.1)

## 2020-09-01 LAB — CBC WITH DIFFERENTIAL/PLATELET
Abs Immature Granulocytes: 0.08 10*3/uL — ABNORMAL HIGH (ref 0.00–0.07)
Basophils Absolute: 0 10*3/uL (ref 0.0–0.1)
Basophils Relative: 0 %
Eosinophils Absolute: 0 10*3/uL (ref 0.0–0.5)
Eosinophils Relative: 0 %
HCT: 35.7 % — ABNORMAL LOW (ref 36.0–46.0)
Hemoglobin: 10.8 g/dL — ABNORMAL LOW (ref 12.0–15.0)
Immature Granulocytes: 1 %
Lymphocytes Relative: 9 %
Lymphs Abs: 1 10*3/uL (ref 0.7–4.0)
MCH: 25.8 pg — ABNORMAL LOW (ref 26.0–34.0)
MCHC: 30.3 g/dL (ref 30.0–36.0)
MCV: 85.2 fL (ref 80.0–100.0)
Monocytes Absolute: 0.9 10*3/uL (ref 0.1–1.0)
Monocytes Relative: 9 %
Neutro Abs: 8.7 10*3/uL — ABNORMAL HIGH (ref 1.7–7.7)
Neutrophils Relative %: 81 %
Platelets: 298 10*3/uL (ref 150–400)
RBC: 4.19 MIL/uL (ref 3.87–5.11)
RDW: 12.3 % (ref 11.5–15.5)
WBC: 10.7 10*3/uL — ABNORMAL HIGH (ref 4.0–10.5)
nRBC: 0.4 % — ABNORMAL HIGH (ref 0.0–0.2)

## 2020-09-01 LAB — GLUCOSE, CAPILLARY
Glucose-Capillary: 264 mg/dL — ABNORMAL HIGH (ref 70–99)
Glucose-Capillary: 261 mg/dL — ABNORMAL HIGH (ref 70–99)
Glucose-Capillary: 289 mg/dL — ABNORMAL HIGH (ref 70–99)

## 2020-09-01 LAB — QUANTIFERON-TB GOLD PLUS (RQFGPL)
QuantiFERON Mitogen Value: 0.11 IU/mL
QuantiFERON Nil Value: 0.03 IU/mL
QuantiFERON TB1 Ag Value: 0.03 IU/mL
QuantiFERON TB2 Ag Value: 0.06 IU/mL

## 2020-09-01 LAB — QUANTIFERON-TB GOLD PLUS: QuantiFERON-TB Gold Plus: UNDETERMINED — AB

## 2020-09-01 LAB — C-REACTIVE PROTEIN: CRP: 5.4 mg/dL — ABNORMAL HIGH (ref <1.0)

## 2020-09-01 LAB — PHOSPHORUS: Phosphorus: 2.6 mg/dL (ref 2.5–4.6)

## 2020-09-01 LAB — MAGNESIUM: Magnesium: 2.5 mg/dL — ABNORMAL HIGH (ref 1.7–2.4)

## 2020-09-01 MED ORDER — INSULIN ASPART 100 UNIT/ML ~~LOC~~ SOLN
0.0000 [IU] | Freq: Three times a day (TID) | SUBCUTANEOUS | Status: DC
  Administered 2020-09-01 (×2): 5 [IU] via SUBCUTANEOUS
  Administered 2020-09-02: 3 [IU] via SUBCUTANEOUS
  Administered 2020-09-02: 1 [IU] via SUBCUTANEOUS
  Administered 2020-09-02 – 2020-09-03 (×2): 7 [IU] via SUBCUTANEOUS
  Administered 2020-09-03: 3 [IU] via SUBCUTANEOUS
  Administered 2020-09-03: 9 [IU] via SUBCUTANEOUS

## 2020-09-01 MED ORDER — ENOXAPARIN SODIUM 60 MG/0.6ML ~~LOC~~ SOLN
60.0000 mg | SUBCUTANEOUS | Status: DC
  Administered 2020-09-02: 60 mg via SUBCUTANEOUS
  Filled 2020-09-01: qty 0.6

## 2020-09-01 MED ORDER — NAPHAZOLINE-GLYCERIN 0.012-0.2 % OP SOLN
1.0000 [drp] | Freq: Four times a day (QID) | OPHTHALMIC | Status: DC | PRN
  Administered 2020-09-01 – 2020-09-02 (×3): 2 [drp] via OPHTHALMIC
  Filled 2020-09-01: qty 15

## 2020-09-01 NOTE — Progress Notes (Signed)
Pt currently on hhfnc 40L 100% with a RR 22 Spo2 97 she does not need bipap at this time. RT will monitor.

## 2020-09-01 NOTE — Progress Notes (Signed)
  Echocardiogram 2D Echocardiogram has been performed.  Wendy Ruiz 09/01/2020, 1:47 PM

## 2020-09-01 NOTE — Progress Notes (Signed)
NAME:  Wendy Ruiz, MRN:  [id number], DOB:  01-Mar-1956, LOS: 2 ADMISSION DATE:  08/29/2020, CONSULTATION DATE: 09/01/20 REFERRING MD:  Kinnie Feil, MD CHIEF COMPLAINT: Shortness of breath  Brief History   64 year old woman feeling ill for the last week to week and a half admitted with acute hypoxic respiratory failure with ARDS due to COVID-19 pneumonia whom we are consulted for severe hypoxemia.  History of present illness   Patient noted feeling ill a week to week and a half ago.  Malaise, fever.  Significant fatigue.  Noted some labored breathing particularly when lying flat.  Also more labored when walking around the house.  Children have been coming to the house to check on her as she is not been feeling well.  She went to sleep last night and awoke to EMS in her room.  Sounds like she had episode of syncope and did not hear his children banging on the door.  Therefore rescue squad was called.  She was transported to the ED.  Chest x-ray with bilateral infiltrates.  Severely hypoxemic requiring nonrebreather.  Desaturation with movement when lying flat.  CTA performed which showed near confluent dense groundglass opacities throughout all lung fields.  No PE.  Oxygenation and respiratory rate seems to improved with the addition of high flow nasal cannula to nonrebreather.  She feels more comfortable sitting upright in the chair as opposed to laying in stretcher.  Did try to prone for a little bit but felt her respiratory distress worsened and her oxygen saturation worsened per RN.  Past Medical History  GERD  Significant Hospital Events   9/19 admitted  Consults:  PCCM  Procedures:  N/A  Significant Diagnostic Tests:  CTA 9/19 with no PE, near confluent dense groundglass opacities in all lobes of bilateral lungs  Micro Data:  Pending  Antimicrobials:  Remdesivir 9/19  Interim history/subjective:    comfortable sitting up at bedside, O2 has been weaned to 30 L/min  plus mask No cough, no chest discomfort  Objective   Blood pressure 122/79, pulse 93, temperature 97.9 F (36.6 C), temperature source Oral, resp. rate 20, height 5\' 4"  (1.626 m), weight 115.8 kg, SpO2 94 %.    FiO2 (%):  [100 %] 100 %   Intake/Output Summary (Last 24 hours) at 09/01/2020 1352 Last data filed at 09/01/2020 0947 Gross per 24 hour  Intake 340 ml  Output --  Net 340 ml   Filed Weights   08/29/20 2226 08/30/20 2045 09/01/20 0518  Weight: 102.5 kg 110.5 kg 115.8 kg    Examination: General: Up to chair, obese woman, no evidence of respiratory distress Pulmonary: Bilateral inspiratory crackles, 30 L/min HF [place]. CV: Distant, no tachycardia, no murmur Abdomen: Obese, nondistended, positive bowel sounds Extremities: No edema  Resolved Hospital Problem list   n/a  Assessment & Plan:  Acute hypoxemic respiratory failure due to ARDS in setting of COVID-19 pneumonia -Continue remdesivir, Solu-Medrol, baricitinib -Self proning if she can tolerate -I-S, pulmonary hygiene -Up to chair, PT. Okay to tolerate periods of hypoxemia especially with exertion as long as she does not show increased work of breathing, accessory muscle use, endorgan changes such as confusion, etc. -Echocardiogram performed today 9/21, await results    Best practice:  Per primary  Labs   CBC: Recent Labs  Lab 08/29/20 2248 08/31/20 0518 09/01/20 0146  WBC 8.9 8.1 10.7*  NEUTROABS 7.8* 6.5 8.7*  HGB 11.8* 11.6* 10.8*  HCT 38.4 38.1 35.7*  MCV 85.1  87.2 85.2  PLT 210 260 826    Basic Metabolic Panel: Recent Labs  Lab 08/29/20 2248 08/31/20 0518 09/01/20 0146  NA 136 138 136  K 3.9 4.0 4.3  CL 98 104 102  CO2 24 25 23   GLUCOSE 221* 188* 243*  BUN 9 13 17   CREATININE 1.12* 0.89 0.83  CALCIUM 8.5* 8.4* 8.6*  MG  --  2.5* 2.5*  PHOS  --  2.6 2.6   GFR: Estimated Creatinine Clearance: 87.8 mL/min (by C-G formula based on SCr of 0.83 mg/dL). Recent Labs  Lab 08/29/20 2247  08/29/20 2248 08/30/20 0525 08/31/20 0518 09/01/20 0146  PROCALCITON  --  0.23  --   --   --   WBC  --  8.9  --  8.1 10.7*  LATICACIDVEN 1.9  --  2.3*  --   --     Liver Function Tests: Recent Labs  Lab 08/29/20 2248 08/31/20 0518 09/01/20 0146  AST 118* 104* 71*  ALT 56* 60* 60*  ALKPHOS 72 66 79  BILITOT 0.8 0.8 0.7  PROT 7.1 6.4* 6.3*  ALBUMIN 2.9* 2.4* 2.6*    Baltazar Apo, MD, PhD 09/01/2020, 1:56 PM [facility] [phone] or if no answer [phone]

## 2020-09-01 NOTE — Hospital Course (Addendum)
  Quantiferon Gold - repeat IGRA test outpatient to confirm negative as test during admission was indeterminate, likely due to mishandling of sample.   Consider lipid panel outpatient

## 2020-09-01 NOTE — Progress Notes (Signed)
Family Medicine Teaching Service Daily Progress Note Intern Pager: [phone]  Patient name: Wendy Ruiz Medical record number: [id number] Date of birth: [birth year] Age: 64 y.o. Gender: female  Primary Care Provider: Lyndee Hensen, DO Consultants:  Code Status: Full  Pt Overview and Major Events to Date:  9/19: Admission  Assessment and Plan:  Acute hypoxic respiratory failure secondary to COVID-19  sepsis Patient currently on 35 L HFNC with oxygen saturations in the 90s. Trending labs: LDH 724, Ferritin >7500, CRP 14.6>10.3>5.4, Procal 0.23, LA 2.3, D-dimer 1.58>1.87>3.42.  Hepatitis panel non-reactive.QuantiFERON gold indeterminate (will need outpatient follow-up). -Finish high-dose Solu-Medrol today for total of 3 days (9/19-9/21). Tomorrow transition to p.o. some 50 mg daily x14 days -Tylenol as needed for fever -Remdesivir (9-19-) -Baricitinib (9/19-) continue x14 days or until discharge -Continuous oxygen: Parameters 88 to 92%, wean as able -Tessalon Perles and Robitussin for cough as needed -Encourage spirometry as able  Hyperglycemia, acute versus chronic Blood sugar this morning 243, HbA1C 5.7.  Consider getting lipid panel outpatient. -SSI if necessary -CBG monitoring  Acute kidney disease, in the setting of Covid pneumonia, resolved On admission Cr 1.12, today is 0.83 with a BUN of 17 -Oral hydration as tolerated, consider maintenance IV fluid if worsening renal function -Continue to monitor  Mild transaminitis Common finding and COVID-19 infections. Continue to monitor closely.  Hepatitis panel non-reactiveAST/ALT: 118/56 > 104/60 > 71/60.  -Continue to monitor  Bipolar disease Stable.  Previously nothing needed medications regular disease. -Monitor mental health, can consider psych consult if concerned  Obesity Body mass index is 38.79    FEN/GI: Full liquid.  Replete electrolytes as necessary PPx: Lovenox   Status is: Inpatient  Remains  inpatient appropriate because:Inpatient level of care appropriate due to severity of illness   Dispo: The patient is from: Home              Anticipated d/c is to: Home              Anticipated d/c date is: > 3 days              Patient currently is not medically stable to d/c.   Subjective:  Patient states that when she is sitting in her room she has a tendency to have short shallow breaths.  Patient is unsure what exactly she should do to be helping herself get better, she feels like she does not have any direction on what she should be doing when alone in the room.  Patient says she has been using the spirometer and trying to eat when she can, as directed by respiratory therapy.  Patient states that she starts to get anxious when she feels that she has to cough because it caused her a lot of respiratory distress when she was at home.  She gets anxious now near the 4-hour mark before she supposed to take her next dose of Robitussin for her cough.  Patient states that the [facility] did not help her.  She is aware of the cause of her anxiety and tries to work.  But still finds herself anxious when she feels the need to cough which can cause her to become tachypneic.  Objective: Temp:  [98.1 F (36.7 C)-98.8 F (37.1 C)] 98.2 F (36.8 C) (09/21 0545) Pulse Rate:  [90-100] 90 (09/21 0545) Resp:  [26-50] 47 (09/21 0545) BP: (102-127)/(59-82) 127/69 (09/21 0545) SpO2:  [81 %-94 %] 90 % (09/21 0545) FiO2 (%):  [100 %] 100 % (09/21  2103) Weight:  [115.8 kg] 115.8 kg (09/21 0518) Physical Exam: General: NAD, sitting in recliner next to bed Cardiovascular: RRR, no M/R/G appreciated. Difficult to auscultate with high flow nasal cannula  Respiratory: Early on 35 L HFNC, tachypneic, able to finish sentences easily, has increases in oxygen saturations while speaking. Abdomen: Nontender, nondistended, soft  Laboratory: Recent Labs  Lab 08/29/20 2248 08/31/20 0518 09/01/20 0146  WBC 8.9  8.1 10.7*  HGB 11.8* 11.6* 10.8*  HCT 38.4 38.1 35.7*  PLT 210 260 298   Recent Labs  Lab 08/29/20 2248 08/31/20 0518 09/01/20 0146  NA 136 138 136  K 3.9 4.0 4.3  CL 98 104 102  CO2 24 25 23   BUN 9 13 17   CREATININE 1.12* 0.89 0.83  CALCIUM 8.5* 8.4* 8.6*  PROT 7.1 6.4* 6.3*  BILITOT 0.8 0.8 0.7  ALKPHOS 72 66 79  ALT 56* 60* 60*  AST 118* 104* 71*  GLUCOSE 221* 188* 243*     Imaging/Diagnostic Tests: No results found.   Rise Patience, DO 09/01/2020, 5:45 AM PGY-1, [place] Intern pager: [phone], text pages welcome

## 2020-09-02 DIAGNOSIS — U071 COVID-19: Secondary | ICD-10-CM | POA: Diagnosis not present

## 2020-09-02 DIAGNOSIS — R05 Cough: Secondary | ICD-10-CM | POA: Diagnosis not present

## 2020-09-02 DIAGNOSIS — J80 Acute respiratory distress syndrome: Secondary | ICD-10-CM | POA: Diagnosis not present

## 2020-09-02 DIAGNOSIS — J1282 Pneumonia due to coronavirus disease 2019: Secondary | ICD-10-CM | POA: Diagnosis not present

## 2020-09-02 DIAGNOSIS — J9601 Acute respiratory failure with hypoxia: Secondary | ICD-10-CM | POA: Diagnosis not present

## 2020-09-02 DIAGNOSIS — R739 Hyperglycemia, unspecified: Secondary | ICD-10-CM | POA: Diagnosis not present

## 2020-09-02 LAB — CBC WITH DIFFERENTIAL/PLATELET
Abs Immature Granulocytes: 0.67 10*3/uL — ABNORMAL HIGH (ref 0.00–0.07)
Basophils Absolute: 0 10*3/uL (ref 0.0–0.1)
Basophils Relative: 0 %
Eosinophils Absolute: 0 10*3/uL (ref 0.0–0.5)
Eosinophils Relative: 0 %
HCT: 38.3 % (ref 36.0–46.0)
Hemoglobin: 11.6 g/dL — ABNORMAL LOW (ref 12.0–15.0)
Immature Granulocytes: 4 %
Lymphocytes Relative: 9 %
Lymphs Abs: 1.7 10*3/uL (ref 0.7–4.0)
MCH: 25.7 pg — ABNORMAL LOW (ref 26.0–34.0)
MCHC: 30.3 g/dL (ref 30.0–36.0)
MCV: 84.9 fL (ref 80.0–100.0)
Monocytes Absolute: 1.2 10*3/uL — ABNORMAL HIGH (ref 0.1–1.0)
Monocytes Relative: 6 %
Neutro Abs: 15.6 10*3/uL — ABNORMAL HIGH (ref 1.7–7.7)
Neutrophils Relative %: 81 %
Platelets: 326 10*3/uL (ref 150–400)
RBC: 4.51 MIL/uL (ref 3.87–5.11)
RDW: 12.1 % (ref 11.5–15.5)
WBC: 19.1 10*3/uL — ABNORMAL HIGH (ref 4.0–10.5)
nRBC: 0.9 % — ABNORMAL HIGH (ref 0.0–0.2)

## 2020-09-02 LAB — COMPREHENSIVE METABOLIC PANEL
ALT: 54 U/L — ABNORMAL HIGH (ref 0–44)
AST: 51 U/L — ABNORMAL HIGH (ref 15–41)
Albumin: 2.9 g/dL — ABNORMAL LOW (ref 3.5–5.0)
Alkaline Phosphatase: 108 U/L (ref 38–126)
Anion gap: 10 (ref 5–15)
BUN: 13 mg/dL (ref 8–23)
CO2: 28 mmol/L (ref 22–32)
Calcium: 8.8 mg/dL — ABNORMAL LOW (ref 8.9–10.3)
Chloride: 99 mmol/L (ref 98–111)
Creatinine, Ser: 0.92 mg/dL (ref 0.44–1.00)
GFR calc Af Amer: >60 mL/min (ref >60)
GFR calc non Af Amer: >60 mL/min (ref >60)
Glucose, Bld: 157 mg/dL — ABNORMAL HIGH (ref 70–99)
Potassium: 3.5 mmol/L (ref 3.5–5.1)
Sodium: 137 mmol/L (ref 135–145)
Total Bilirubin: 0.7 mg/dL (ref 0.3–1.2)
Total Protein: 6.6 g/dL (ref 6.5–8.1)

## 2020-09-02 LAB — BLOOD GAS, ARTERIAL
Acid-Base Excess: 3.8 mmol/L — ABNORMAL HIGH (ref 0.0–2.0)
Bicarbonate: 27.4 mmol/L (ref 20.0–28.0)
Drawn by: 519031
FIO2: 100
O2 Saturation: 86.9 %
Patient temperature: 37.4
pCO2 arterial: 38.9 mmHg (ref 32.0–48.0)
pH, Arterial: 7.464 — ABNORMAL HIGH (ref 7.350–7.450)
pO2, Arterial: 53.5 mmHg — ABNORMAL LOW (ref 83.0–108.0)

## 2020-09-02 LAB — ECHOCARDIOGRAM LIMITED
Area-P 1/2: 4.26 cm2
Height: 64 in
S' Lateral: 3.06 cm
Weight: 4084.68 oz

## 2020-09-02 LAB — GLUCOSE, CAPILLARY
Glucose-Capillary: 146 mg/dL — ABNORMAL HIGH (ref 70–99)
Glucose-Capillary: 208 mg/dL — ABNORMAL HIGH (ref 70–99)
Glucose-Capillary: 331 mg/dL — ABNORMAL HIGH (ref 70–99)
Glucose-Capillary: 249 mg/dL — ABNORMAL HIGH (ref 70–99)

## 2020-09-02 LAB — MAGNESIUM: Magnesium: 2.4 mg/dL (ref 1.7–2.4)

## 2020-09-02 LAB — PHOSPHORUS: Phosphorus: 2.7 mg/dL (ref 2.5–4.6)

## 2020-09-02 LAB — D-DIMER, QUANTITATIVE: D-Dimer, Quant: >20 ug/mL-FEU — ABNORMAL HIGH (ref 0.00–0.50)

## 2020-09-02 LAB — C-REACTIVE PROTEIN: CRP: 2.2 mg/dL — ABNORMAL HIGH (ref <1.0)

## 2020-09-02 MED ORDER — POLYETHYLENE GLYCOL 3350 17 G PO PACK
17.0000 g | PACK | Freq: Two times a day (BID) | ORAL | Status: DC
  Administered 2020-09-02 – 2020-09-23 (×16): 17 g via ORAL
  Filled 2020-09-02 (×40): qty 1

## 2020-09-02 MED ORDER — FUROSEMIDE 10 MG/ML IJ SOLN
20.0000 mg | Freq: Once | INTRAMUSCULAR | Status: AC
  Administered 2020-09-02: 20 mg via INTRAVENOUS
  Filled 2020-09-02: qty 2

## 2020-09-02 MED ORDER — PHENOL 1.4 % MT LIQD
1.0000 | OROMUCOSAL | Status: DC | PRN
  Administered 2020-09-07 – 2020-09-08 (×3): 1 via OROMUCOSAL
  Filled 2020-09-02: qty 177

## 2020-09-02 MED ORDER — ENOXAPARIN SODIUM 120 MG/0.8ML ~~LOC~~ SOLN
115.0000 mg | Freq: Two times a day (BID) | SUBCUTANEOUS | Status: DC
  Administered 2020-09-02 – 2020-09-04 (×5): 115 mg via SUBCUTANEOUS
  Filled 2020-09-02 (×7): qty 0.76

## 2020-09-02 NOTE — Progress Notes (Signed)
   09/02/20 2209  Assess: MEWS Score  Temp 99.6 F (37.6 C)  BP (!) 142/66  Pulse Rate 87  ECG Heart Rate 87  Resp (!) 26  SpO2 92 %  Assess: MEWS Score  MEWS Temp 0  MEWS Systolic 0  MEWS Pulse 0  MEWS RR 2  MEWS LOC 0  MEWS Score 2  MEWS Score Color Yellow  Assess: if the MEWS score is Yellow or Red  Were vital signs taken at a resting state? Yes  Focused Assessment No change from prior assessment  Early Detection of Sepsis Score *See Row Information* Low  MEWS guidelines implemented *See Row Information* Yes  Treat  MEWS Interventions Escalated (See documentation below)  Pain Scale 0-10  Pain Score 0  Take Vital Signs  Increase Vital Sign Frequency  Yellow: Q 2hr X 2 then Q 4hr X 2, if remains yellow, continue Q 4hrs  Escalate  MEWS: Escalate Yellow: discuss with charge nurse/RN and consider discussing with provider and RRT  Notify: Charge Nurse/RN  Name of Charge Nurse/RN Notified Denton Ar, Agricultural consultant  Date Charge Nurse/RN Notified 09/02/20  Time Charge Nurse/RN Notified 2205  Document  Patient Outcome Other (Comment) (Continue to monitor)  Progress note created (see row info) Yes    Pt MEWS score yellow d/t high RR; pt has been going back and forth between Green and Yellow. Updated Denton Ar, Charge RN for MEWS and informed Lesa, NT of increased VS frequency.

## 2020-09-02 NOTE — Progress Notes (Signed)
Family Medicine Teaching Service Daily Progress Note Intern Pager: [phone]  Patient name: Wendy Ruiz Medical record number: [id number] Date of birth: September 21, 1956 Age: 64 y.o. Gender: female  Primary Care Provider: Lyndee Hensen, DO Consultants: CCM Code Status: Full  Pt Overview and Major Events to Date:  9/19: Admission  Assessment and Plan:  Acute hypoxic respiratory failure secondary to COVID-19  sepsis Patient currently on 40 L HFNC with oxygen saturations in the 90s.  Trending labs: CRP 14.6>10.3>5.4>2.2, D-dimer 1.58>1.87>3.42. S/p Solu-Medrol x3 days.  Echo remarkable for dilated inferior vena cava, right atrium pressure 15 mmHg with severe pulmonary artery hypertension with volume overload, with EF 60 to 65%. -Prednisone 50 mg daily x14 days -Tylenol as needed for fever -Remdesivir (9-19-) -Baricitinib (9/19-) continue x14 days or until discharge -Continuous oxygen: Parameters 88 to 92%, wean as able -Tessalon Perles and Robitussin for cough as needed -Encourage spirometry as able  Hyperglycemia, acute versus chronic Blood sugar this morning 146.  Patient received 10 units of NovoLog yesterday. -sSSI -CBG monitoring    Constipation Patient reports she has not had a bowel movement since 1 week before her admission.  On physical exam patient abdominal exam is soft, nontender, nondistended. -Starting MiraLAX 17 g twice daily  Mild transaminitis, improving Common finding and COVID-19 infections. Continue to monitor closely.  Hepatitis panel non-reactive. AST/ALT: 118/56 > 104/60 > 71/60 > 51/54.  -Continue to monitor  Bipolar disease Stable.  Previously nothing needed medications regular disease. -Monitor mental health, can consider psych consult if concerned  Obesity  Acute kidney disease, in the setting of Covid pneumonia, resolved  FEN/GI: Full liquid.  Replete electrolytes as necessary PPx: Lovenox   Status is: Inpatient  Remains inpatient  appropriate because:Inpatient level of care appropriate due to severity of illness   Dispo: The patient is from: Home              Anticipated d/c is to: Home              Anticipated d/c date is: > 3 days              Patient currently is not medically stable to d/c.   Subjective:  Patient reports she feels that she is doing about the same as she has been lately.  She says she still is a little bit anxious sometimes when she feels that she has to cough.  She does report mild chest soreness when she has a slow breathy cough.  Patient's only other complaint is that the oxygen mask plays into her eye, though the eyedrops given yesterday have helped.  Patient denies other chest pain, headaches, sudden worsening of breathing.  Patient reports she has not had a bowel movement since a week before her admission, which is not normal for her; though she does not report any abdominal pain or discomfort.  Objective: Temp:  [97.9 F (36.6 C)-99.3 F (37.4 C)] 98.6 F (37 C) (09/22 0437) Pulse Rate:  [82-97] 83 (09/22 0437) Resp:  [19-47] 20 (09/22 0437) BP: (108-138)/(62-95) 116/95 (09/22 0437) SpO2:  [85 %-100 %] 100 % (09/22 0437) FiO2 (%):  [95 %-100 %] 95 % (09/22 0208) Physical Exam: General: NAD, sitting in recliner next to bed, appears mildly ill Cardiovascular: Distant heart sounds, RRR, no M/R/G appreciated. Difficult to auscultate with high flow nasal cannula  Respiratory: On 40L HFNC, bilateral inspiratory crackles, able to speak in full sentences. Abdomen: Soft, nontender, nondistended, bowel sounds present  Laboratory: Recent Labs  Lab 08/29/20 2248 08/31/20 0518 09/01/20 0146  WBC 8.9 8.1 10.7*  HGB 11.8* 11.6* 10.8*  HCT 38.4 38.1 35.7*  PLT 210 260 298   Recent Labs  Lab 08/29/20 2248 08/31/20 0518 09/01/20 0146  NA 136 138 136  K 3.9 4.0 4.3  CL 98 104 102  CO2 24 25 23   BUN 9 13 17   CREATININE 1.12* 0.89 0.83  CALCIUM 8.5* 8.4* 8.6*  PROT 7.1 6.4* 6.3*   BILITOT 0.8 0.8 0.7  ALKPHOS 72 66 79  ALT 56* 60* 60*  AST 118* 104* 71*  GLUCOSE 221* 188* 243*     Imaging/Diagnostic Tests: ECHOCARDIOGRAM LIMITED  Result Date: 09/01/2020    ECHOCARDIOGRAM LIMITED REPORT   Patient Name:   Wendy Ruiz Date of Exam: 09/01/2020 Medical Rec #:  [id number]           Height:       64.0 in Accession #:    [id number]          Weight:       255.3 lb Date of Birth:  1956-08-26           BSA:          2.170 m Patient Age:    45 years            BP:           122/79 mmHg Patient Gender: F                   HR:           97 bpm. Exam Location:  Inpatient Procedure: 2D Echo Indications:    786.09 dyspnea  History:        Patient has no prior history of Echocardiogram examinations.                 Risk Factors:Former Smoker. Covid +.  Sonographer:    Jannett Celestine RDCS (AE) Referring Phys: [phone] MATTHEW R HUNSUCKER IMPRESSIONS  1. Left ventricular ejection fraction, by estimation, is 60 to 65%. The left ventricle has normal function. The left ventricle has no regional wall motion abnormalities. Left ventricular diastolic parameters were normal.  2. Right ventricular systolic function is normal. The right ventricular size is normal. There is severely elevated pulmonary artery systolic pressure.  3. The mitral valve is normal in structure. No evidence of mitral valve regurgitation. No evidence of mitral stenosis.  4. The aortic valve is normal in structure. Aortic valve regurgitation is not visualized. No aortic stenosis is present.  5. The inferior vena cava is normal in size with greater than 50% respiratory variability, suggesting right atrial pressure of 3 mmHg. FINDINGS  Left Ventricle: Left ventricular ejection fraction, by estimation, is 60 to 65%. The left ventricle has normal function. The left ventricle has no regional wall motion abnormalities. The left ventricular internal cavity size was normal in size. There is  no left ventricular hypertrophy. Left ventricular  diastolic parameters were normal. Right Ventricle: The right ventricular size is normal. No increase in right ventricular wall thickness. Right ventricular systolic function is normal. There is severely elevated pulmonary artery systolic pressure. The tricuspid regurgitant velocity is 3.43 m/s, and with an assumed right atrial pressure of 15 mmHg, the estimated right ventricular systolic pressure is 09.9 mmHg. Left Atrium: Left atrial size was normal in size. Right Atrium: Right atrial size was normal in size. Pericardium: There is no evidence of pericardial effusion. Mitral Valve: The mitral valve  is normal in structure. No evidence of mitral valve stenosis. Tricuspid Valve: The tricuspid valve is normal in structure. Tricuspid valve regurgitation is trivial. No evidence of tricuspid stenosis. Aortic Valve: The aortic valve is normal in structure. Aortic valve regurgitation is not visualized. No aortic stenosis is present. Pulmonic Valve: The pulmonic valve was normal in structure. Pulmonic valve regurgitation is not visualized. No evidence of pulmonic stenosis. Aorta: The aortic root is normal in size and structure. Venous: The inferior vena cava is normal in size with greater than 50% respiratory variability, suggesting right atrial pressure of 3 mmHg. IAS/Shunts: No atrial level shunt detected by color flow Doppler. LEFT VENTRICLE PLAX 2D LVIDd:         4.25 cm Diastology LVIDs:         3.06 cm LV e' medial:    8.38 cm/s LV PW:         0.96 cm LV E/e' medial:  8.9 LV IVS:        0.93 cm LV e' lateral:   12.40 cm/s                        LV E/e' lateral: 6.0  MITRAL VALVE               TRICUSPID VALVE MV Area (PHT): 4.26 cm    TR Peak grad:   47.1 mmHg MV Decel Time: 178 msec    TR Vmax:        343.00 cm/s MV E velocity: 74.55 cm/s MV A velocity: 68.98 cm/s MV E/A ratio:  1.08 Mihai Croitoru MD Electronically signed by Sanda Klein MD Signature Date/Time: 09/01/2020/3:40:50 PM    Final      Rise Patience,  DO 09/02/2020, 5:35 AM PGY-1, [place] Intern pager: [phone], text pages welcome

## 2020-09-02 NOTE — Progress Notes (Signed)
FPTS Interim Progress Note  Was called by CCM Dr. Chase Caller who noted that the patient's D-dimer had jumped from 8.42 to >20.  This causes concern for possible DVT or a small PE, especially considering patient's comorbid conditions and obesity.  There is also concern about the trend with D-dimer as sudden rise in D-dimer can indicate bad prognosis and the patient is currently already at [place].  CCM recommended trying BiPAP nightly if able to tolerate as well as starting treatment dose of Lovenox instead of just prophylaxis.  The following labs and imaging were ordered and need follow-up: -ABG -Repeat CXR -Duplex LE  Patient needs to be seen overnight by residents. If there is any decline in respiratory status, will likely need transition to CCM as primary.  Rise Patience, D.O.  PGY-1  Family Medicine  [phone] 09/02/2020 6:31 PM

## 2020-09-02 NOTE — Progress Notes (Signed)
NAME:  Wendy Ruiz, MRN:  [id number], DOB:  [birth year], LOS: 3 ADMISSION DATE:  08/29/2020, CONSULTATION DATE: 09/02/20 REFERRING MD:  Kinnie Feil, MD CHIEF COMPLAINT: Shortness of breath  Brief History   64 year old woman feeling ill for the last week to week and a half admitted with acute hypoxic respiratory failure with ARDS due to COVID-19 pneumonia whom we are consulted for severe hypoxemia.  History of present illness   Patient noted feeling ill a week to week and a half ago.  Malaise, fever.  Significant fatigue.  Noted some labored breathing particularly when lying flat.  Also more labored when walking around the house.  Children have been coming to the house to check on her as she is not been feeling well.  She went to sleep last night and awoke to EMS in her room.  Sounds like she had episode of syncope and did not hear his children banging on the door.  Therefore rescue squad was called.  She was transported to the ED.  Chest x-ray with bilateral infiltrates.  Severely hypoxemic requiring nonrebreather.  Desaturation with movement when lying flat.  CTA performed which showed near confluent dense groundglass opacities throughout all lung fields.  No PE.  Oxygenation and respiratory rate seems to improved with the addition of high flow nasal cannula to nonrebreather.  She feels more comfortable sitting upright in the chair as opposed to laying in stretcher.  Did try to prone for a little bit but felt her respiratory distress worsened and her oxygen saturation worsened per RN.  Past Medical History  GERD  Significant Hospital Events   9/19 admitted  Consults:  PCCM  Procedures:   Significant Diagnostic Tests:  CTA 9/19 with no PE, near confluent dense groundglass opacities in all lobes of bilateral lungs 09/02/2019 one 2D echo essentially unremarkable  Micro Data:  Pending  Antimicrobials:  Remdesivir 9/19  Interim history/subjective:   Reports breathing better  today but remains on 100% FiO2  Objective   Blood pressure 127/82, pulse 84, temperature 98.1 F (36.7 C), temperature source Oral, resp. rate (!) 33, height 5\' 4"  (1.626 m), weight 115.8 kg, SpO2 93 %.    FiO2 (%):  [95 %-100 %] 100 %   Intake/Output Summary (Last 24 hours) at 09/02/2020 1030 Last data filed at 09/02/2020 0854 Gross per 24 hour  Intake 840 ml  Output --  Net 840 ml   Filed Weights   08/29/20 2226 08/30/20 2045 09/01/20 0518  Weight: 102.5 kg 110.5 kg 115.8 kg    Examination: General.  Obese female sitting up in chair in no acute distress at rest currently on 100% FiO2 Pulmonary.  Decreased breath sounds throughout currently on 100% high flow nasal cannula Cardiac.  Heart sounds are distant Abdominal.  Positive bowel sounds Extremities.  Warm and dry   Resolved Hospital Problem list   n/a  Assessment & Plan:  Acute hypoxic respiratory failure secondary to ARDS in setting of COVID-19 pneumonia  Continue self proning FiO2 as needed to keep sats greater than 80% Out of bed as tolerated 2D echo was essentially normal She does have periods of hypoxemia when active which is to be expected. Pulmonary critical care will continue to follow     Best practice:  Per primary  Labs   CBC: Recent Labs  Lab 08/29/20 2248 08/31/20 0518 09/01/20 0146 09/02/20 0851  WBC 8.9 8.1 10.7* 19.1*  NEUTROABS 7.8* 6.5 8.7* 15.6*  HGB 11.8* 11.6* 10.8* 11.6*  HCT 38.4 38.1 35.7* 38.3  MCV 85.1 87.2 85.2 84.9  PLT 210 260 298 179    Basic Metabolic Panel: Recent Labs  Lab 08/29/20 2248 08/31/20 0518 09/01/20 0146 09/02/20 0851  NA 136 138 136 137  K 3.9 4.0 4.3 3.5  CL 98 104 102 99  CO2 24 25 23 28   GLUCOSE 221* 188* 243* 157*  BUN 9 13 17 13   CREATININE 1.12* 0.89 0.83 0.92  CALCIUM 8.5* 8.4* 8.6* 8.8*  MG  --  2.5* 2.5* 2.4  PHOS  --  2.6 2.6 2.7   GFR: Estimated Creatinine Clearance: 79.2 mL/min (by C-G formula based on SCr of 0.92  mg/dL). Recent Labs  Lab 08/29/20 2247 08/29/20 2248 08/30/20 0525 08/31/20 0518 09/01/20 0146 09/02/20 0851  PROCALCITON  --  0.23  --   --   --   --   WBC  --  8.9  --  8.1 10.7* 19.1*  LATICACIDVEN 1.9  --  2.3*  --   --   --     Liver Function Tests: Recent Labs  Lab 08/29/20 2248 08/31/20 0518 09/01/20 0146 09/02/20 0851  AST 118* 104* 71* 51*  ALT 56* 60* 60* 54*  ALKPHOS 72 66 79 108  BILITOT 0.8 0.8 0.7 0.7  PROT 7.1 6.4* 6.3* 6.6  ALBUMIN 2.9* 2.4* 2.6* 2.9*    Steve Lyriq Finerty ACNP Acute Care Nurse Practitioner [place] Please consult Amion 09/02/2020, 10:31 AM

## 2020-09-02 NOTE — Progress Notes (Signed)
Clarified echo read with cardiologist, Dr. Sallyanne Kuster. Patient has dilated inferior vena cava, right atrium pressure is 66mmHg, patient does have severe pulmonary artery hypertension with volume overload. She has not received an IVF, will diurese today.  Gladys Damme, MD [facility], PGY-2

## 2020-09-02 NOTE — Progress Notes (Signed)
Patient's VS turned to yellow for respiration. Discussed patient's condition with CN Nicole. Patient is alert and oriented x4. No acute change. Will continue monitor.

## 2020-09-02 NOTE — Progress Notes (Addendum)
ANTICOAGULATION CONSULT NOTE - Initial Consult  Pharmacy Consult for Lovenox Indication: therapeutic dosing, elevated d-dimer and COVID+  Allergies  Allergen Reactions  . Penicillins Rash    Patient Measurements: Height: 5\' 4"  (162.6 cm) Weight: 115.8 kg (255 lb 4.7 oz) IBW/kg (Calculated) : 54.7 Lovenox Dosing Weight: 115.8 kg  Vital Signs: Temp: 99.2 F (37.3 C) (09/22 1551) Temp Source: Oral (09/22 1551) BP: 125/87 (09/22 1551) Pulse Rate: 88 (09/22 1351)  Labs: Recent Labs    08/31/20 0518 08/31/20 0518 08/31/20 1958 09/01/20 0146 09/02/20 0851  HGB 11.6*   < >  --  10.8* 11.6*  HCT 38.1  --   --  35.7* 38.3  PLT 260  --   --  298 326  CREATININE 0.89  --   --  0.83 0.92  TROPONINIHS  --   --  32*  --   --    < > = values in this interval not displayed.    Estimated Creatinine Clearance: 79.2 mL/min (by C-G formula based on SCr of 0.92 mg/dL).   Medical History: Past Medical History:  Diagnosis Date  . Bipolar 1 disorder ([place])   . 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 Obesity    Assessment:  64 yr old with COVID-19 pneumonia. Has been on Lovenox ~0.5 mg/kg for VTE prophylaxis.  D-dimer 1.58 on admit, has trended up to >20 today, to increase to therapeutic Lovenox dosing.  LE duplex pending. CTA negative for PE on 9/19.   Last Lovenox dose of 60 mg given at 8am this morning. Weight 102 kg on admit 9/18 > 110.5 kg on 9/19 and up to 115.8 kg today.   Goal of Therapy:  Anti-Xa level 0.6-1 units/ml 4hrs after LMWH dose given Monitor platelets by anticoagulation protocol: Yes   Plan:   Increase Lovenox to 115 mg SQ q12h.  Follow renal function, CBC, weights, LE duplex.  Monitor for any signs/symptoms of bleeding  Arty Baumgartner, [address] Phone: [phone] 09/02/2020,6:41 PM

## 2020-09-02 NOTE — Progress Notes (Signed)
FPTS Interim Progress Note  S: went to see patient for evening rounds. Patient states her breathing feels better ever since she was able to remove a "large glob" of mucus from her nose earlier today. Feels like she was very congested before but now she can breathe better. Denies significant cough. Has been sitting up in her chair for several hours and was able to tolerate PO intake today without difficulty.  Patient asking for eye drops, as she feels the oxygen is blowing close to her eyes and drying them out.  O: BP (!) 143/88 (BP Location: Right Arm)   Pulse 90   Temp 99.8 F (37.7 C) (Oral)   Resp (!) 23   Ht 5\' 4"  (1.626 m)   Wt 115.8 kg   SpO2 93%   BMI 43.82 kg/m   Gen: alert, no acute distress, sitting upright in chair Cardiovascular: RRR, normal S1/S2 without m/r/g Lungs: normal work of breathing on 40L nonrebreather and nasal cannula. O2 sats in the mid 90s. Speaking in nearly full sentences. Lungs CTAB Psych: occasionally tearful   A/P: Acute Hypoxic Respiratory Failure 2/2 COVID PNA Currently stable on 40L, respiratory exam unchanged from prior. Patient feels her breathing is somewhat improved after relieving some nasal congestion today. Increased concern from CCM given large jump in D-dimer today (3.42 to >20). -Plan for BiPAP tonight per CCM -LE duplex and repeat CXR per CCM -Increased to Lovenox 115mg  BID today -Continue to monitor closely -Supplemental O2 as needed, goal SpO2 >88% -am labs: CBC, CMP, CRP, D-dimer, mag, phos -Continue Prednisone, remdesevir, baricitinib -Clear eyes ophthalmic solution prn -[place] nasal spray prn   Alcus Dad, MD 09/02/2020, 8:48 PM PGY-1, [place] pager [phone]

## 2020-09-02 NOTE — Progress Notes (Signed)
RT NOTES: ABG obtained and sent to lab. Lab tech notified.  

## 2020-09-03 ENCOUNTER — Inpatient Hospital Stay (HOSPITAL_COMMUNITY): Payer: HRSA Program

## 2020-09-03 DIAGNOSIS — J80 Acute respiratory distress syndrome: Secondary | ICD-10-CM | POA: Diagnosis not present

## 2020-09-03 DIAGNOSIS — R609 Edema, unspecified: Secondary | ICD-10-CM

## 2020-09-03 DIAGNOSIS — J1282 Pneumonia due to coronavirus disease 2019: Secondary | ICD-10-CM | POA: Diagnosis not present

## 2020-09-03 DIAGNOSIS — U071 COVID-19: Secondary | ICD-10-CM | POA: Diagnosis not present

## 2020-09-03 DIAGNOSIS — J9601 Acute respiratory failure with hypoxia: Secondary | ICD-10-CM | POA: Diagnosis not present

## 2020-09-03 LAB — PHOSPHORUS: Phosphorus: 2.9 mg/dL (ref 2.5–4.6)

## 2020-09-03 LAB — CBC WITH DIFFERENTIAL/PLATELET
Abs Immature Granulocytes: 0.5 10*3/uL — ABNORMAL HIGH (ref 0.00–0.07)
Basophils Absolute: 0 10*3/uL (ref 0.0–0.1)
Basophils Relative: 0 %
Eosinophils Absolute: 0 10*3/uL (ref 0.0–0.5)
Eosinophils Relative: 0 %
HCT: 38.7 % (ref 36.0–46.0)
Hemoglobin: 12 g/dL (ref 12.0–15.0)
Lymphocytes Relative: 5 %
Lymphs Abs: 1.2 10*3/uL (ref 0.7–4.0)
MCH: 26.1 pg (ref 26.0–34.0)
MCHC: 31 g/dL (ref 30.0–36.0)
MCV: 84.1 fL (ref 80.0–100.0)
Monocytes Absolute: 1 10*3/uL (ref 0.1–1.0)
Monocytes Relative: 4 %
Myelocytes: 2 %
Neutro Abs: 21.9 10*3/uL — ABNORMAL HIGH (ref 1.7–7.7)
Neutrophils Relative %: 89 %
Platelets: 338 10*3/uL (ref 150–400)
RBC: 4.6 MIL/uL (ref 3.87–5.11)
RDW: 12.2 % (ref 11.5–15.5)
WBC: 24.6 10*3/uL — ABNORMAL HIGH (ref 4.0–10.5)
nRBC: 1 /100 WBC — ABNORMAL HIGH
nRBC: 1.3 % — ABNORMAL HIGH (ref 0.0–0.2)

## 2020-09-03 LAB — POCT I-STAT 7, (LYTES, BLD GAS, ICA,H+H)
Acid-Base Excess: 5 mmol/L — ABNORMAL HIGH (ref 0.0–2.0)
Bicarbonate: 28.2 mmol/L — ABNORMAL HIGH (ref 20.0–28.0)
Calcium, Ion: 1.1 mmol/L — ABNORMAL LOW (ref 1.15–1.40)
HCT: 37 % (ref 36.0–46.0)
Hemoglobin: 12.6 g/dL (ref 12.0–15.0)
O2 Saturation: 76 %
Potassium: 4 mmol/L (ref 3.5–5.1)
Sodium: 135 mmol/L (ref 135–145)
TCO2: 29 mmol/L (ref 22–32)
pCO2 arterial: 35.1 mmHg (ref 32.0–48.0)
pH, Arterial: 7.512 — ABNORMAL HIGH (ref 7.350–7.450)
pO2, Arterial: 37 mmHg — CL (ref 83.0–108.0)

## 2020-09-03 LAB — URINALYSIS, ROUTINE W REFLEX MICROSCOPIC
Bilirubin Urine: NEGATIVE
Glucose, UA: >=500 mg/dL — AB
Hgb urine dipstick: NEGATIVE
Ketones, ur: NEGATIVE mg/dL
Leukocytes,Ua: NEGATIVE
Nitrite: NEGATIVE
Protein, ur: NEGATIVE mg/dL
Specific Gravity, Urine: 1.01 (ref 1.005–1.030)
pH: 7.5 (ref 5.0–8.0)

## 2020-09-03 LAB — CULTURE, BLOOD (ROUTINE X 2)
Culture: NO GROWTH
Culture: NO GROWTH

## 2020-09-03 LAB — COMPREHENSIVE METABOLIC PANEL
ALT: 48 U/L — ABNORMAL HIGH (ref 0–44)
AST: 43 U/L — ABNORMAL HIGH (ref 15–41)
Albumin: 2.8 g/dL — ABNORMAL LOW (ref 3.5–5.0)
Alkaline Phosphatase: 98 U/L (ref 38–126)
Anion gap: 15 (ref 5–15)
BUN: 11 mg/dL (ref 8–23)
CO2: 23 mmol/L (ref 22–32)
Calcium: 8.7 mg/dL — ABNORMAL LOW (ref 8.9–10.3)
Chloride: 94 mmol/L — ABNORMAL LOW (ref 98–111)
Creatinine, Ser: 0.97 mg/dL (ref 0.44–1.00)
GFR calc Af Amer: >60 mL/min (ref >60)
GFR calc non Af Amer: >60 mL/min (ref >60)
Glucose, Bld: 255 mg/dL — ABNORMAL HIGH (ref 70–99)
Potassium: 3.7 mmol/L (ref 3.5–5.1)
Sodium: 132 mmol/L — ABNORMAL LOW (ref 135–145)
Total Bilirubin: 1.5 mg/dL — ABNORMAL HIGH (ref 0.3–1.2)
Total Protein: 6.7 g/dL (ref 6.5–8.1)

## 2020-09-03 LAB — URINALYSIS, MICROSCOPIC (REFLEX)

## 2020-09-03 LAB — MAGNESIUM: Magnesium: 2 mg/dL (ref 1.7–2.4)

## 2020-09-03 LAB — GLUCOSE, CAPILLARY
Glucose-Capillary: 245 mg/dL — ABNORMAL HIGH (ref 70–99)
Glucose-Capillary: 308 mg/dL — ABNORMAL HIGH (ref 70–99)
Glucose-Capillary: 352 mg/dL — ABNORMAL HIGH (ref 70–99)
Glucose-Capillary: 350 mg/dL — ABNORMAL HIGH (ref 70–99)

## 2020-09-03 LAB — D-DIMER, QUANTITATIVE: D-Dimer, Quant: >20 ug/mL-FEU — ABNORMAL HIGH (ref 0.00–0.50)

## 2020-09-03 LAB — PROCALCITONIN: Procalcitonin: 0.18 ng/mL

## 2020-09-03 LAB — C-REACTIVE PROTEIN: CRP: 8.7 mg/dL — ABNORMAL HIGH (ref <1.0)

## 2020-09-03 MED ORDER — FUROSEMIDE 10 MG/ML IJ SOLN
40.0000 mg | Freq: Once | INTRAMUSCULAR | Status: AC
  Administered 2020-09-03: 40 mg via INTRAVENOUS
  Filled 2020-09-03: qty 4

## 2020-09-03 MED ORDER — SODIUM CHLORIDE 0.9 % IV SOLN
INTRAVENOUS | Status: DC | PRN
  Administered 2020-09-03: 250 mL via INTRAVENOUS

## 2020-09-03 MED ORDER — INSULIN DETEMIR 100 UNIT/ML ~~LOC~~ SOLN
12.0000 [IU] | Freq: Two times a day (BID) | SUBCUTANEOUS | Status: DC
  Administered 2020-09-03 – 2020-09-10 (×13): 12 [IU] via SUBCUTANEOUS
  Filled 2020-09-03 (×16): qty 0.12

## 2020-09-03 MED ORDER — SODIUM CHLORIDE 0.9 % IV SOLN
500.0000 mg | INTRAVENOUS | Status: DC
  Administered 2020-09-03: 500 mg via INTRAVENOUS
  Filled 2020-09-03 (×2): qty 500

## 2020-09-03 MED ORDER — SODIUM CHLORIDE 0.9 % IV SOLN
2.0000 g | Freq: Three times a day (TID) | INTRAVENOUS | Status: DC
  Administered 2020-09-03 – 2020-09-08 (×15): 2 g via INTRAVENOUS
  Filled 2020-09-03 (×15): qty 2

## 2020-09-03 MED ORDER — INSULIN ASPART 100 UNIT/ML ~~LOC~~ SOLN
2.0000 [IU] | SUBCUTANEOUS | Status: DC
  Administered 2020-09-04: 4 [IU] via SUBCUTANEOUS
  Administered 2020-09-04: 6 [IU] via SUBCUTANEOUS
  Administered 2020-09-04: 2 [IU] via SUBCUTANEOUS
  Administered 2020-09-04: 6 [IU] via SUBCUTANEOUS
  Administered 2020-09-04: 4 [IU] via SUBCUTANEOUS
  Administered 2020-09-05: 2 [IU] via SUBCUTANEOUS
  Administered 2020-09-05: 4 [IU] via SUBCUTANEOUS
  Administered 2020-09-05 (×2): 6 [IU] via SUBCUTANEOUS
  Administered 2020-09-06 (×3): 4 [IU] via SUBCUTANEOUS
  Administered 2020-09-06 (×2): 6 [IU] via SUBCUTANEOUS
  Administered 2020-09-07: 4 [IU] via SUBCUTANEOUS
  Administered 2020-09-07: 6 [IU] via SUBCUTANEOUS
  Administered 2020-09-07 – 2020-09-08 (×3): 2 [IU] via SUBCUTANEOUS
  Administered 2020-09-08: 6 [IU] via SUBCUTANEOUS
  Administered 2020-09-08: 4 [IU] via SUBCUTANEOUS

## 2020-09-03 MED ORDER — INSULIN ASPART 100 UNIT/ML ~~LOC~~ SOLN
10.0000 [IU] | Freq: Once | SUBCUTANEOUS | Status: AC
  Administered 2020-09-03: 10 [IU] via SUBCUTANEOUS

## 2020-09-03 MED ORDER — FUROSEMIDE 10 MG/ML IJ SOLN
40.0000 mg | Freq: Four times a day (QID) | INTRAMUSCULAR | Status: AC
  Administered 2020-09-03 (×2): 40 mg via INTRAVENOUS
  Filled 2020-09-03 (×2): qty 4

## 2020-09-03 MED ORDER — METHYLPREDNISOLONE SODIUM SUCC 125 MG IJ SOLR
125.0000 mg | Freq: Every day | INTRAMUSCULAR | Status: DC
  Administered 2020-09-03 – 2020-09-05 (×3): 125 mg via INTRAVENOUS
  Filled 2020-09-03 (×3): qty 2

## 2020-09-03 MED ORDER — INSULIN GLARGINE 100 UNIT/ML ~~LOC~~ SOLN
5.0000 [IU] | Freq: Every day | SUBCUTANEOUS | Status: DC
  Administered 2020-09-03: 5 [IU] via SUBCUTANEOUS
  Filled 2020-09-03: qty 0.05

## 2020-09-03 NOTE — Progress Notes (Signed)
Family Medicine Teaching Service Daily Progress Note Intern Pager: [phone]  Patient name: Wendy Ruiz Medical record number: [id number] Date of birth: [birth year] Age: 64 y.o. Gender: female  Primary Care Provider: Lyndee Hensen, DO Consultants: CCM Code Status: Full  Pt Overview and Major Events to Date:  9/19: Admission  Assessment and Plan:  Acute hypoxic respiratory failure secondary to COVID-19  sepsis Overnight patient was febrile with a temperature of 101.1 with tachycardia. Yesterday patient's WBC went from 10.7>19.1>24.6, and D-dimer elevated from 3.42>20>20, CRP 2.2> 8.7.  Patient was seen by CCM recommended therapeutic Lovenox dose, nightly BiPAP (was not started last night, see notes), CXR, ABG, duplex US.  Due to fever overnight, procalcitonin and blood cultures were added to the labs for the morning.  Patient is currently on 40 L HFNC with nonrebreather.  ABG: pH 7.464, PO2 53.5.  CXR persistent widespread airspace opacity bilaterally without areas of consolidation; appearance consistent with multifocal atypical or convincing pneumonia; a degree of underlying ours may be present, stable cardiac.  Prominence; no adenopathy evident by radiography.  LE Doppler US: No evidence of DVT. -Placing patient back on Solu-Medrol -Tylenol as needed for fever -Remdesivir (9-19-) -Baricitinib (9/19-) continue x14 days or until discharge -Continuous oxygen: Parameters 88 to 92% -Tessalon Perles and Robitussin for cough as needed -Encourage spirometry as able -Follow-up labs: Procalcitonin, blood cultures -Stat ABG ordered -Starting abx: cefepime and azithromycin -IV Lasix 40 mg -CCM following, appreciate recommendations  Hyperglycemia, acute versus chronic Blood sugar this morning 245.  Patient received 11 units of NovoLog yesterday.  Due to increasing sugars and steroid use starting patient on 5 units of Lantus, will need to keep in mind weaning off and watching sugars when  weaning off steroids -sSSI -Starting 5 units Lantus -CBG monitoring    Constipation Patient still has not had a bowel movement.  On physical exam patient abdominal exam is soft, nontender, nondistended. -Continue MiraLAX 17 g twice daily  Mild transaminitis, improving Common finding and COVID-19 infections. Continue to monitor closely.  Hepatitis panel non-reactive. AST/ALT: 118/56 > 104/60 > 71/60 > 51/54.  -Continue to monitor  Bipolar disease Stable.  Previously nothing needed medications regular disease. -Monitor mental health, can consider psych consult if concerned  Obesity  Acute kidney disease, in the setting of Covid pneumonia, resolved  FEN/GI: Full liquid.  Replete electrolytes as necessary PPx: Lovenox   Status is: Inpatient  Remains inpatient appropriate because:Inpatient level of care appropriate due to severity of illness   Dispo: The patient is from: Home              Anticipated d/c is to: Home              Anticipated d/c date is: > 3 days              Patient currently is not medically stable to d/c.   Subjective:  Patient states that she is doing about the same as she has previously done.  She has had no change in her respiratory status at the time of exam, still feels short of breath but no more than previously.  Patient keeps repeating that she doesn't understand what is going on and why this is happening.  Patient states that she thinks she would like to try and have the BiPAP on during the day if the plan is to go ahead and have her take it at night, in the hopes that it may help her breathe better.  Objective: Temp:  [  98.2 F (36.8 C)-101.1 F (38.4 C)] 98.2 F (36.8 C) (09/23 0658) Pulse Rate:  [87-125] 117 (09/23 0658) Resp:  [22-36] 30 (09/23 0658) BP: (112-149)/(58-88) 122/73 (09/23 0658) SpO2:  [83 %-96 %] 90 % (09/23 0658) FiO2 (%):  [100 %] 100 % (09/23 0858) Physical Exam: General: Mild-mod respiratory distress, sitting up on edge of  bed Cardiovascular: Distant heart sounds, RRR, no M/R/G appreciated. Difficult to auscultate with high flow nasal cannula, no peripheral edema Respiratory: On 40L HFNC, bilateral inspiratory crackles, able to speak in short sentences. Abdomen: Soft, nontender, nondistended, bowel sounds present Extremities: b/l LE nontender, no edema/swelling, no erythema  Laboratory: Recent Labs  Lab 09/01/20 0146 09/02/20 0851 09/03/20 0722  WBC 10.7* 19.1* 24.6*  HGB 10.8* 11.6* 12.0  HCT 35.7* 38.3 38.7  PLT 298 326 338   Recent Labs  Lab 09/01/20 0146 09/02/20 0851 09/03/20 0722  NA 136 137 132*  K 4.3 3.5 3.7  CL 102 99 94*  CO2 23 28 23   BUN 17 13 11   CREATININE 0.83 0.92 0.97  CALCIUM 8.6* 8.8* 8.7*  PROT 6.3* 6.6 6.7  BILITOT 0.7 0.7 1.5*  ALKPHOS 79 108 98  ALT 60* 54* 48*  AST 71* 51* 43*  GLUCOSE 243* 157* 255*     Imaging/Diagnostic Tests: DG CHEST PORT 1 VIEW  Result Date: 09/03/2020 CLINICAL DATA:  Adult respiratory distress syndrome. Reported COVID-19 positive EXAM: PORTABLE CHEST 1 VIEW COMPARISON:  August 29, 2020 chest radiograph and chest CT August 30, 2020 FINDINGS: There is increased airspace opacity throughout the lungs bilaterally with a more even distribution of airspace opacity compared to prior studies. No consolidation. Heart is mildly enlarged with pulmonary vascularity normal. No adenopathy. No bone lesions. IMPRESSION: Persistent widespread airspace opacity bilaterally without areas of consolidation. Appearance consistent with multifocal atypical organism pneumonia. A degree of underlying ARDS may be present. Stable cardiac prominence. No adenopathy evident by radiography. Electronically Signed   By: Lowella Grip III M.D.   On: 09/03/2020 07:59     Rise Patience, DO 09/03/2020, 8:59 AM PGY-1, [place] Intern pager: [phone], text pages welcome

## 2020-09-03 NOTE — Progress Notes (Addendum)
After review of her ABG we called CCM. Nursing has informed us we cannot provide bipap in her current physical location.  CCM has assumed care for this patient. We appreciate their consult and care. I also spoke with Dr Tamala Julian by phone just now and he confirmed that CCM is assuming care and she will remain on 5W for the time being.

## 2020-09-03 NOTE — Progress Notes (Signed)
°   09/03/20 0356  Notify: Charge Nurse/RN  Name of Charge Nurse/RN Notified Denton Ar, Charge RN  Date Charge Nurse/RN Notified 09/03/20  Time Charge Nurse/RN Notified 0355  Notify: Provider  Provider Name/Title Rock Nephew, MD  Date Provider Notified 09/03/20  Time Provider Notified [phone]  Notification Type Page  Notification Reason Change in status (Pt's MEWS RED)  Response See new orders  Date of Provider Response 09/03/20  Time of Provider Response 0359  Document  Patient Outcome Other (Comment) (PRNS given and continue to monitor)  Progress note created (see row info) Yes    Q4 VS done and pt had fever w/ increased RR and HR; pt's MEWS RED and informed Denton Ar, Charge RN and Rock Nephew, MD of changes. Spoke w/ Rock Nephew, MD and informed MD that this RN will administer PRN tylenol for fever and robitussin for coughing; MD states that they will come by and take a look at pt. RT was recently at pt's bedside and did not think that O2 needs to be adjusted, but did raise pt's HOB. Will continue to monitor.

## 2020-09-03 NOTE — Progress Notes (Addendum)
FPTS Interim Progress Note  S: Paged by RN that patient's MEWS had turned red due to fever of 101.1 and tachycardia to 125. RR 36 with SpO2 90% on 40L.  Went to bedside to evaluate patient. She is awake, alert, sitting comfortably in bed. Complains of fever and worsening cough but denies shortness of breath. Feels her breathing is overall unchanged from prior. Of note, respiratory was recently at bedside and did not feel O2 needed to be adjusted. Patient denies other complaints including headache, chills, chest pain, calf pain or other issues.  O: BP (!) 143/79 (BP Location: Right Arm)    Pulse (!) 125    Temp (!) 101.1 F (38.4 C) (Oral)    Resp (!) 36    Ht 5\' 4"  (1.626 m)    Wt 115.8 kg    SpO2 90%    BMI 43.82 kg/m   Gen: alert, oriented, NAD Cardiovascular: tachycardic, normal S1/S2 without m/r/g Resp: normal work of breathing on 40L nonrebreather + nasal cannula. Patient with intermittent coughing and increased RR to the 30s with mild subcostal retractions. O2 sats 85-90%. Bibasilar crackles L>R, but lungs otherwise clear to auscultation Abd: soft, nontender, nondistended Skin: warm to touch, no lesions or rashes noted Ext: no peripheral edema, 2+ distal pulses, no calf tenderness  A/P: Fever, Acute Hypoxic Respiratory Failure 2/2 COVID PNA Patient with new fever to 101.1. Had previously been afebrile since admission.  Also with tachycardia, which I suspect is 2/2 fever. Of note, patient's WBC 19.1 yesterday morning (from 10.7 the previous day) and D-dimer >20 yesterday from 3.42 the previous day. She was increased to therapeutic Lovenox dosing yesterday. CCM recommended nightly BIPAP, however this was not trialed tonight as RN states they're unable to use BIPAP on the floor and it would require transfer to ICU level of care. -Patient given Tylenol 650mg  -Will recheck vitals q1h to see if fever and tachycardia improve w/Tylenol -Given Robitussin for cough -Continue to monitor  respiratory status closely -Supplemental O2 as needed, Goal SpO2 >88% -Will obtain repeat CXR -Will add procal and blood cultures to am CBC, CMP -CCM following, appreciate recommendations -Duplex US to be performed today per CCM recs -Consider repeat ABG   Alcus Dad, MD 09/03/2020, 4:24 AM PGY-1, [place] Medicine Service pager [phone]

## 2020-09-03 NOTE — Progress Notes (Addendum)
Pharmacy Antibiotic Note  Wendy Ruiz is a 64 y.o. female admitted on 08/29/2020 with COVID. The patient's clinical status is now worsening along with increased oxygen requirements and there is new concern for pneumonia. The patient is newly febrile with a Tmax of 100.4. The patient is also on azithromycin 500 mg IV daily. Pharmacy has been consulted for cefepime dosing.   Plan: - Initiate cefepime 2 g IV q8h hours - Monitor clinical status, renal function cultures - Deescalate antibiotics and length of therapy as appropriate - Pharmacy will make dose adjustments as needed for renal function   Height: 5\' 4"  (162.6 cm) Weight: 112.2 kg (247 lb 5.7 oz) IBW/kg (Calculated) : 54.7  Temp (24hrs), Avg:99.3 F (37.4 C), Min:98.2 F (36.8 C), Max:101.1 F (38.4 C)  Recent Labs  Lab 08/29/20 2247 08/29/20 2248 08/30/20 0525 08/31/20 0518 09/01/20 0146 09/02/20 0851 09/03/20 0722  WBC  --  8.9  --  8.1 10.7* 19.1* 24.6*  CREATININE  --  1.12*  --  0.89 0.83 0.92 0.97  LATICACIDVEN 1.9  --  2.3*  --   --   --   --     Estimated Creatinine Clearance: 73.8 mL/min (by C-G formula based on SCr of 0.97 mg/dL).    Allergies  Allergen Reactions  . Penicillins Rash    Antimicrobials this admission: 9/23 cefepime >> 9/23 azithromycin >>  9/19 remdesivir >> 9/23 9/19 baracitinib >> (10/2)  Microbiology results: 9/18 BCx: NGTD 9/19 MRSA PCR: negative   Thank you for allowing pharmacy to be a part of this patient's care.  Wendy Ruiz, PharmD, [place]  PGY-1 Pharmacy Resident 09/03/2020 11:30 AM  Please check [email or web address] for unit-specific pharmacy phone numbers.

## 2020-09-03 NOTE — Progress Notes (Signed)
Pt is on [place] 40L 100% tolerates well.  Notified nurse about bipap protocol. He is aware and will contact Dr.

## 2020-09-03 NOTE — Progress Notes (Signed)
CALL PAGER [phone] for any questions or notifications regarding this patient  FMTS Attending Note: Wendy Mcmurray MD On my exam this morning she seems to be tiring from her increased WOB. Has not been getting BiPap at night. She tells me they have worked that ou and are going to initiate BiPap for her this AM. I am ordering a stat ABG and we will change her steroids back to IV as she may not be absorbing well from her GI tract. CXR perhaps shows evolving new focus of pneumonia so we will add cefipime and azithromycin. CCM is following and we will touch base with them after ABG.

## 2020-09-03 NOTE — Progress Notes (Signed)
FPTS Interim Progress Note  Went to see patient for a.m. check.  She is sitting on side of bed.  Demonstrates labored breathing at rest and pauses with conversation.  She is maintaining saturations in the 90s on 40 L.  She endorses slightly worsened cough that has become slightly more productive; noted some blood tend sputum yesterday.   She has become tachycardic in the 110s.  CRP and D-dimer continue to uptrend.  She had fever overnight to 101.1, which is new since admission.  Chest x-ray personally reviewed and notable for persistent widespread airspace disease bilaterally with areas of consolidation -consistent with multifocal atypical organism pneumonia.  A degree of underlying ARDS may be present.  Plan: COVID-19  Tachycardia  Fever High concern for development of PE.   - LE dopplers pending  - currently on treatment dose of Lovenox Some concern for superimposed bacterial infection   - will discuss with team concerning starting antibiotics Start BiPAP -can transition between HFNC and BiPAP throughout the day Repeat ABG in AM or sooner if worsening CCM following, appreicate recs Continue to monitor closely  Danna Hefty, DO 09/03/2020, 9:25 AM PGY-3, [place] Medicine Service pager [phone]

## 2020-09-03 NOTE — Progress Notes (Signed)
[place] Progress Note Patient Name: YULIYA NOVA DOB: 1956/04/22 MRN: [id number]   Date of Service  09/03/2020  HPI/Events of Note  Bllood sugar 350 which is above the limit for patient's current sliding scale regimen.  eICU Interventions  Novolog 10 units sq x 1 ordered to bring blood sugar below 250 mg %.        Frederik Pear 09/03/2020, 11:31 PM

## 2020-09-03 NOTE — Progress Notes (Signed)
Inpatient Diabetes Program Recommendations  AACE/ADA: New Consensus Statement on Inpatient Glycemic Control (2015)  Target Ranges:  Prepandial:   less than 140 mg/dL      Peak postprandial:   less than 180 mg/dL (1-2 hours)      Critically ill patients:  140 - 180 mg/dL   Lab Results  Component Value Date   GLUCAP 308 (H) 09/03/2020   HGBA1C 5.7 (H) 08/31/2020    Review of Glycemic Control Results for AILYNE, PAWLEY (MRN [id number]) as of 09/03/2020 13:36  Ref. Range 09/02/2020 07:48 09/02/2020 11:51 09/02/2020 16:22 09/02/2020 21:02 09/03/2020 07:39 09/03/2020 11:36  Glucose-Capillary Latest Ref Range: 70 - 99 mg/dL 146 (H) 208 (H) 331 (H) 249 (H) 245 (H) 308 (H)  Diabetes history: No hx DM Current orders for Inpatient glycemic control: Lantus 5 units + Novolog sensitive correction tid + SoluMedrol 125 mg qd  Inpatient Diabetes Program Recommendations:   -Increase Novolog correction to moderate 0-15 tid + hs 0-5 units -Increase Lantus to 15 units daily ( 0.2 units/kg x 112.2 kg = 22.44 units)  Thank you, Bethena Roys E. Earline Stiner, RN, MSN, CDE  Diabetes Coordinator Inpatient Glycemic Control Team Team Pager [phone] (8am-5pm) 09/03/2020 1:44 PM

## 2020-09-03 NOTE — Progress Notes (Signed)
FPTS Interim Progress Note  Spoke to daughter to provide update. Reassured her of stability and informed her of transfer of care. She thanked [place] for our wonderful care and is very grateful for the outstanding care she has been receiving at [facility]. Provided with ICU attending's name for her to reach out for updates going forward.  We appreciate CCM's assistance in caring for this very sick patient. We look forward to taking back over the care of our patient if she continues to remain stable for the floor.   Mina Marble [place], DO 09/03/2020, 6:59 PM PGY-3, [place] Service pager [phone]

## 2020-09-03 NOTE — Progress Notes (Signed)
Lower extremity venous has been completed.   Preliminary results in CV Proc.   Wendy Ruiz 09/03/2020 10:12 AM

## 2020-09-03 NOTE — Progress Notes (Signed)
[facility] Progress Note Patient Name: Wendy Ruiz DOB: [birth year] MRN: [id number]   Date of Service  09/03/2020  HPI/Events of Note  Patients blood sugar is 350 mg %  eICU Interventions  CBG Q 4 hours with standard sq insulin regimen ordered.        Frederik Pear 09/03/2020, 10:47 PM

## 2020-09-03 NOTE — Progress Notes (Addendum)
NAME:  Wendy Ruiz, MRN:  [id number], DOB:  1956-11-27, LOS: 4 ADMISSION DATE:  08/29/2020, CONSULTATION DATE: 09/03/20 REFERRING MD:  Dickie La, MD CHIEF COMPLAINT: Shortness of breath  Brief History   64 year old woman feeling ill for the last week to week and a half admitted with acute hypoxic respiratory failure with ARDS due to COVID-19 pneumonia whom we are consulted for severe hypoxemia.  History of present illness   Patient noted feeling ill a week to week and a half ago.  Malaise, fever.  Significant fatigue.  Noted some labored breathing particularly when lying flat.  Also more labored when walking around the house.  Children have been coming to the house to check on her as she is not been feeling well.  She went to sleep last night and awoke to EMS in her room.  Sounds like she had episode of syncope and did not hear his children banging on the door.  Therefore rescue squad was called.  She was transported to the ED.  Chest x-ray with bilateral infiltrates.  Severely hypoxemic requiring nonrebreather.  Desaturation with movement when lying flat.  CTA performed which showed near confluent dense groundglass opacities throughout all lung fields.  No PE.  Oxygenation and respiratory rate seems to improved with the addition of high flow nasal cannula to nonrebreather.  She feels more comfortable sitting upright in the chair as opposed to laying in stretcher.  Did try to prone for a little bit but felt her respiratory distress worsened and her oxygen saturation worsened per RN.  Past Medical History  GERD  Significant Hospital Events   9/19 admitted  Consults:  PCCM  Procedures:   Significant Diagnostic Tests:  CTA 9/19 with no PE, near confluent dense groundglass opacities in all lobes of bilateral lungs 09/02/2019 one 2D echo essentially unremarkable  923 lower extremity Doppler studies-> neg Micro Data:  Pending  Antimicrobials:  Remdesivir 9/19  Interim  history/subjective:   Reports feeling better every day Very nervous about going ICU TX to icu  Objective   Blood pressure 122/73, pulse (!) 117, temperature 98.2 F (36.8 C), temperature source Oral, resp. rate (!) 30, height 5\' 4"  (1.626 m), weight 115.8 kg, SpO2 90 %.    FiO2 (%):  [100 %] 100 %   Intake/Output Summary (Last 24 hours) at 09/03/2020 0820 Last data filed at 09/03/2020 0160 Gross per 24 hour  Intake 580 ml  Output 300 ml  Net 280 ml   Filed Weights   08/29/20 2226 08/30/20 2045 09/01/20 0518  Weight: 102.5 kg 110.5 kg 115.8 kg    Examination: General: Obese female no acute distress HEENT: No JVD or lymphadenopathy is appreciated Neuro: Grossly intact without focal defect CV: Heart sounds regular regular rate and rhythm PULM: Diminished breath sounds throughout. remains on high flow GI: soft, bsx4 active   Extremities: warm/dry,  1+ edema  Skin: no rashes or lesions    Resolved Hospital Problem list   n/a  Assessment & Plan:  Acute hypoxic respiratory failure secondary to ARDS in setting of COVID-19 pneumonia  Continue self proning FiO2 as needed Continue pharmaceutical interventions Tx to ICU for P02 of 37 Continue abx    Elevated D-dimer Lower extremity Doppler studies are pending       Best practice:  Per primary  Labs   CBC: Recent Labs  Lab 08/29/20 2248 08/31/20 0518 09/01/20 0146 09/02/20 0851 09/03/20 0722  WBC 8.9 8.1 10.7* 19.1* 24.6*  NEUTROABS 7.8*  6.5 8.7* 15.6* PENDING  HGB 11.8* 11.6* 10.8* 11.6* 12.0  HCT 38.4 38.1 35.7* 38.3 38.7  MCV 85.1 87.2 85.2 84.9 84.1  PLT 210 260 298 326 474    Basic Metabolic Panel: Recent Labs  Lab 08/29/20 2248 08/31/20 0518 09/01/20 0146 09/02/20 0851  NA 136 138 136 137  K 3.9 4.0 4.3 3.5  CL 98 104 102 99  CO2 24 25 23 28   GLUCOSE 221* 188* 243* 157*  BUN 9 13 17 13   CREATININE 1.12* 0.89 0.83 0.92  CALCIUM 8.5* 8.4* 8.6* 8.8*  MG  --  2.5* 2.5* 2.4  PHOS  --   2.6 2.6 2.7   GFR: Estimated Creatinine Clearance: 79.2 mL/min (by C-G formula based on SCr of 0.92 mg/dL). Recent Labs  Lab 08/29/20 2247 08/29/20 2248 08/29/20 2248 08/30/20 0525 08/31/20 0518 09/01/20 0146 09/02/20 0851 09/03/20 0722  PROCALCITON  --  0.23  --   --   --   --   --   --   WBC  --  8.9   < >  --  8.1 10.7* 19.1* 24.6*  LATICACIDVEN 1.9  --   --  2.3*  --   --   --   --    < > = values in this interval not displayed.    Liver Function Tests: Recent Labs  Lab 08/29/20 2248 08/31/20 0518 09/01/20 0146 09/02/20 0851  AST 118* 104* 71* 51*  ALT 56* 60* 60* 54*  ALKPHOS 72 66 79 108  BILITOT 0.8 0.8 0.7 0.7  PROT 7.1 6.4* 6.3* 6.6  ALBUMIN 2.9* 2.4* 2.6* 2.9*    Steve Dorance Spink ACNP Acute Care Nurse Practitioner [place] Please consult Amion 09/03/2020, 8:20 AM

## 2020-09-03 NOTE — TOC Progression Note (Signed)
Transition of Care [facility]) - Progression Note    Patient Details  Name: Wendy Ruiz MRN: [id number] Date of Birth: Mar 26, 1956  Transition of Care [facility]) CM/SW [place], RN Phone Number: 09/03/2020, 3:45 PM  Clinical Narrative:    Day4,  Inflammatory markers increasing Patient decompensating, increased WOB, on 40L saturations 76% on ABG.  Patient on BiPap on and off, If much worse will need to move to ICU for closer observation.  CM will continue to follow for needs.   Expected Discharge Plan: Home/Self Care Barriers to Discharge: Continued Medical Work up  Expected Discharge Plan and Services Expected Discharge Plan: Home/Self Care   Discharge Planning Services: CM Consult                                           Social Determinants of Health (SDOH) Interventions    Readmission Risk Interventions No flowsheet data found.

## 2020-09-04 DIAGNOSIS — J1282 Pneumonia due to coronavirus disease 2019: Secondary | ICD-10-CM | POA: Diagnosis not present

## 2020-09-04 DIAGNOSIS — J80 Acute respiratory distress syndrome: Secondary | ICD-10-CM | POA: Diagnosis not present

## 2020-09-04 DIAGNOSIS — U071 COVID-19: Secondary | ICD-10-CM | POA: Diagnosis not present

## 2020-09-04 LAB — GLUCOSE, CAPILLARY
Glucose-Capillary: 125 mg/dL — ABNORMAL HIGH (ref 70–99)
Glucose-Capillary: 165 mg/dL — ABNORMAL HIGH (ref 70–99)
Glucose-Capillary: 174 mg/dL — ABNORMAL HIGH (ref 70–99)
Glucose-Capillary: 181 mg/dL — ABNORMAL HIGH (ref 70–99)
Glucose-Capillary: 212 mg/dL — ABNORMAL HIGH (ref 70–99)
Glucose-Capillary: 239 mg/dL — ABNORMAL HIGH (ref 70–99)

## 2020-09-04 LAB — CBC WITH DIFFERENTIAL/PLATELET
Abs Immature Granulocytes: 0 10*3/uL (ref 0.00–0.07)
Basophils Absolute: 0 10*3/uL (ref 0.0–0.1)
Basophils Relative: 0 %
Eosinophils Absolute: 0 10*3/uL (ref 0.0–0.5)
Eosinophils Relative: 0 %
HCT: 37.6 % (ref 36.0–46.0)
Hemoglobin: 11.6 g/dL — ABNORMAL LOW (ref 12.0–15.0)
Lymphocytes Relative: 4 %
Lymphs Abs: 1.1 10*3/uL (ref 0.7–4.0)
MCH: 26.1 pg (ref 26.0–34.0)
MCHC: 30.9 g/dL (ref 30.0–36.0)
MCV: 84.7 fL (ref 80.0–100.0)
Monocytes Absolute: 0.8 10*3/uL (ref 0.1–1.0)
Monocytes Relative: 3 %
Neutro Abs: 26.3 10*3/uL — ABNORMAL HIGH (ref 1.7–7.7)
Neutrophils Relative %: 93 %
Platelets: 419 10*3/uL — ABNORMAL HIGH (ref 150–400)
RBC: 4.44 MIL/uL (ref 3.87–5.11)
RDW: 12.5 % (ref 11.5–15.5)
WBC: 28.3 10*3/uL — ABNORMAL HIGH (ref 4.0–10.5)
nRBC: 0 /100 WBC
nRBC: 0.6 % — ABNORMAL HIGH (ref 0.0–0.2)

## 2020-09-04 LAB — COMPREHENSIVE METABOLIC PANEL
ALT: 46 U/L — ABNORMAL HIGH (ref 0–44)
AST: 43 U/L — ABNORMAL HIGH (ref 15–41)
Albumin: 2.8 g/dL — ABNORMAL LOW (ref 3.5–5.0)
Alkaline Phosphatase: 90 U/L (ref 38–126)
Anion gap: 15 (ref 5–15)
BUN: 20 mg/dL (ref 8–23)
CO2: 24 mmol/L (ref 22–32)
Calcium: 8.7 mg/dL — ABNORMAL LOW (ref 8.9–10.3)
Chloride: 97 mmol/L — ABNORMAL LOW (ref 98–111)
Creatinine, Ser: 1.04 mg/dL — ABNORMAL HIGH (ref 0.44–1.00)
GFR calc Af Amer: >60 mL/min (ref >60)
GFR calc non Af Amer: 58 mL/min — ABNORMAL LOW (ref >60)
Glucose, Bld: 187 mg/dL — ABNORMAL HIGH (ref 70–99)
Potassium: 4.1 mmol/L (ref 3.5–5.1)
Sodium: 136 mmol/L (ref 135–145)
Total Bilirubin: 0.7 mg/dL (ref 0.3–1.2)
Total Protein: 6.8 g/dL (ref 6.5–8.1)

## 2020-09-04 LAB — BLOOD GAS, ARTERIAL
Acid-Base Excess: 6.4 mmol/L — ABNORMAL HIGH (ref 0.0–2.0)
Acid-base deficit: 29.8 mmol/L — ABNORMAL HIGH (ref 0.0–2.0)
Bicarbonate: 29.8 mmol/L — ABNORMAL HIGH (ref 20.0–28.0)
Drawn by: 336832
FIO2: 100
O2 Saturation: 75 %
Patient temperature: 36.7
pCO2 arterial: 38.9 mmHg (ref 32.0–48.0)
pH, Arterial: 7.497 — ABNORMAL HIGH (ref 7.350–7.450)
pO2, Arterial: 40.3 mmHg — ABNORMAL LOW (ref 83.0–108.0)

## 2020-09-04 LAB — STREP PNEUMONIAE URINARY ANTIGEN: Strep Pneumo Urinary Antigen: NEGATIVE

## 2020-09-04 LAB — PHOSPHORUS: Phosphorus: 4.1 mg/dL (ref 2.5–4.6)

## 2020-09-04 LAB — C-REACTIVE PROTEIN: CRP: 13.9 mg/dL — ABNORMAL HIGH (ref <1.0)

## 2020-09-04 LAB — MAGNESIUM: Magnesium: 2.3 mg/dL (ref 1.7–2.4)

## 2020-09-04 MED ORDER — ORAL CARE MOUTH RINSE
15.0000 mL | Freq: Two times a day (BID) | OROMUCOSAL | Status: DC
  Administered 2020-09-04 – 2020-09-27 (×44): 15 mL via OROMUCOSAL

## 2020-09-04 MED ORDER — PANTOPRAZOLE SODIUM 40 MG PO TBEC
40.0000 mg | DELAYED_RELEASE_TABLET | Freq: Every day | ORAL | Status: DC
  Administered 2020-09-04 – 2020-09-29 (×26): 40 mg via ORAL
  Filled 2020-09-04 (×26): qty 1

## 2020-09-04 MED ORDER — CHLORHEXIDINE GLUCONATE CLOTH 2 % EX PADS
6.0000 | MEDICATED_PAD | Freq: Every day | CUTANEOUS | Status: DC
  Administered 2020-09-04 – 2020-09-27 (×22): 6 via TOPICAL

## 2020-09-04 NOTE — Progress Notes (Signed)
RT transported patient from 5W14 to 2M16 with RN. No complications.

## 2020-09-04 NOTE — Progress Notes (Signed)
    Charge 79m RN PEnny -> approached me while I was in 7M . PAiient now still in 5W. Bedside RN concerned about her resp status but pulse ox statys 81-95%  Plan  - tx to ICU  - NPO except meds      SIGNATURE    Dr. Brand Males, M.D., F.C.C.P,  Pulmonary and Critical Care Medicine Staff Physician, [place] Director - Interstitial Lung Disease  Program  Pulmonary [place] [facility] [place], [place], [address]  Pager: [phone], If no answer  OR between  19:00-7:00h: page [phone] Telephone (clinical office): [phone] Telephone (research): [phone]  12:55 PM 09/04/2020

## 2020-09-04 NOTE — Progress Notes (Signed)
    Eyeballed patient in 48m16. Looks same as in 5W   Po2 very low on abg Pulse ox right now 90% on HHFNC and NRB   ASSSEESMENT  Acute hypoxemic resp failure    Plan  - possible VBG but also possible that pulse ox is overestimating her true oxygenation (NEJM study from [place] in racial disparities with pulse ox) - incidence is 10% v 3% for caucasian  - do BiPAP on/off in day + QHS  - monitor resp status clinically with ABG PRN   SIGNATURE    Dr. Brand Males, M.D., F.C.C.P,  Pulmonary and Critical Care Medicine Staff Physician, [place] Director - Interstitial Lung Disease  Program  Pulmonary [place] at [place], [place], [address]  Pager: [phone], If no answer  OR between  19:00-7:00h: page [phone] Telephone (clinical office): [phone] Telephone (research): [phone]  4:02 PM 09/04/2020      Recent Labs  Lab 09/02/20 1847 09/03/20 1125 09/04/20 0913  PHART 7.464* 7.512* 7.497*  PCO2ART 38.9 35.1 38.9  PO2ART 53.5* 37* 40.3*  HCO3 27.4 28.2* 29.8*  TCO2  --  29  --   O2SAT 86.9 76.0 75.0

## 2020-09-04 NOTE — Progress Notes (Signed)
NAME:  Wendy Ruiz, MRN:  [id number], DOB:  24-Apr-1956, LOS: 5 ADMISSION DATE:  08/29/2020, CONSULTATION DATE: 09/04/20 REFERRING MD:  Candee Furbish, MD CHIEF COMPLAINT: Shortness of breath  Brief History    Patient noted feeling ill a week to week and a half ago.  Malaise, fever.  Significant fatigue.  Noted some labored breathing particularly when lying flat.  Also more labored when walking around the house.  Children have been coming to the house to check on her as she is not been feeling well.  She went to sleep last night and awoke to EMS in her room.  Sounds like she had episode of syncope and did not hear his children banging on the door.  Therefore rescue squad was called.  She was transported to the ED.  Chest x-ray with bilateral infiltrates.  Severely hypoxemic requiring nonrebreather.  Desaturation with movement when lying flat.  CTA performed which showed near confluent dense groundglass opacities throughout all lung fields.  No PE.  Oxygenation and respiratory rate seems to improved with the addition of high flow nasal cannula to nonrebreather.  She feels more comfortable sitting upright in the chair as opposed to laying in stretcher.  Did try to prone for a little bit but felt her respiratory distress worsened and her oxygen saturation worsened per RN.  Past Medical History  GERD  has a past medical history of Bipolar 1 disorder ([place]), Chronic pain due to trauma, and Obesity.   has no past surgical history on file.   Significant Hospital Events   9/19 admitted 09/02/2020: Switch to full dose Lovenox empiric treatment because of increasing D-dimer 09/03/2020: CCM primary from family practice teaching service because of concern of decompensation.  Remains in 5 W.  Consults:  PCCM  Procedures:   Significant Diagnostic Tests:  CTA 9/19 with no PE, near confluent dense groundglass opacities in all lobes of bilateral lungs 09/02/2019 one 2D echo essentially unremarkable   923 lower extremity Doppler studies-> neg  Micro Data:  MRSA PCR 08/30/2020: Negative SARS-CoV-2 08/30/2020: Positive Blood culture 09/03/2020: Urine strep 09/04/2020 Urine Legionella 09/04/2020  Antimicrobials:  Remdesivir 9/19 Baricitinib 9/19  (14 days) Solumedrol 9.23 125mg  daily xxx Azithromycin 09/03/2020>> 09/04/2020 Cefepime 09/03/2020 >>   Interim history/subjective:    09/04/2020-she says she is better but nursing notes indicate that she might not be better.  She is on 55 L heated high flow nasal cannula at 100% along with nonrebreather.  Pulse ox is in the 80s.  2 days ago it was in the 90s.  She is having some hemoptysis with thick gray sputum.  This seems to be stable.  But she is sitting and watching TV.  She is unable to prone.  Had slight fever 101 on September 02, 2020 but currently afebrile although white count is up at 24.6.   Objective   Blood pressure 120/64, pulse 98, temperature 98 F (36.7 C), temperature source Oral, resp. rate (!) 26, height 5\' 4"  (1.626 m), weight 112.2 kg, SpO2 (!) 80 %.    FiO2 (%):  [100 %] 100 %   Intake/Output Summary (Last 24 hours) at 09/04/2020 0843 Last data filed at 09/04/2020 0657 Gross per 24 hour  Intake 1170.84 ml  Output 3700 ml  Net -2529.16 ml   Filed Weights   08/30/20 2045 09/01/20 0518 09/03/20 1103  Weight: 110.5 kg 115.8 kg 112.2 kg    Morbidly obese female.  Sitting with heated high flow nasal cannula and facemask.  Alert and oriented x3.  Conversing normally.  Pulse ox was around 83% but the tracing is poor.  Clear to auscultation but overall distant breath sounds normal heart sounds.  Abdomen soft and obese.  No sinus no clubbing no edema.  Mildly labored upon close examination when it is more apparent.   Resolved Hospital Problem list   n/a  Assessment & Plan:  Acute hypoxic respiratory failure secondary to ARDS in setting of COVID-19 pneumonia  09/04/2020: Severe hypoxemia persists.  She is not doing  BiPAP.  On heated high flow and facemask  Plan -Keep pulse ox greater than 88% if possible otherwise greater than 82% would be acceptable -Continue heated high flow nasal cannula and facemask oxygen -BiPAP as as needed -Check blood gas -Intubate if gets worse [full code confirmed]  COVID-19 -status post remdesivir  Plan = Continue steroids and olumiant -High-dose Lovenox given significantly elevated D-dimer [empiric treatment -Check Covid IgG antibody September 05, 2020  Fever 09/02/2020  -Seems to have responded to antibiotic but procalcitonin acceptable  Plan  -DC azithromycin but check urine strep and urine Legionella -Continue cefepime for now  Hyperglycemia  Sliding scale insulin -    Best practice:  Diet: Carb modified Pain/Anxiety/Delirium protocol (if indicated): None VAP protocol (if indicated): Sit in chair elevation of head of bed DVT prophylaxis: Treatment dose Lovenox GI prophylaxis: PPI Glucose control: ssi Mobility: bed rest, chair  Code Status: full Family Communication: pateint Disposition: 5w      ATTESTATION & SIGNATURE   The patient Wendy Ruiz is critically ill with multiple organ systems failure and requires high complexity decision making for assessment and support, frequent evaluation and titration of therapies, application of advanced monitoring technologies and extensive interpretation of multiple databases.   Critical Care Time devoted to patient care services described in this note is  45  Minutes. This time reflects time of care of this signee Dr Brand Males. This critical care time does not reflect procedure time, or teaching time or supervisory time of PA/NP/Med student/Med Resident etc but could involve care discussion time     Dr. Brand Males, M.D., [facility].C.P Pulmonary and Critical Care Medicine Staff Physician [facility] Pager: [phone], If no answer or between   15:00h - 7:00h: call [phone]  09/04/2020 8:43 AM   LABS    PULMONARY Recent Labs  Lab 09/02/20 1847 09/03/20 1125  PHART 7.464* 7.512*  PCO2ART 38.9 35.1  PO2ART 53.5* 37*  HCO3 27.4 28.2*  TCO2  --  29  O2SAT 86.9 76.0    CBC Recent Labs  Lab 09/01/20 0146 09/01/20 0146 09/02/20 0851 09/03/20 0722 09/03/20 1125  HGB 10.8*   < > 11.6* 12.0 12.6  HCT 35.7*   < > 38.3 38.7 37.0  WBC 10.7*  --  19.1* 24.6*  --   PLT 298  --  326 338  --    < > = values in this interval not displayed.    COAGULATION No results for input(s): INR in the last 168 hours.  CARDIAC  No results for input(s): TROPONINI in the last 168 hours. No results for input(s): PROBNP in the last 168 hours.   CHEMISTRY Recent Labs  Lab 08/29/20 2248 08/29/20 2248 08/31/20 0518 08/31/20 0518 09/01/20 0146 09/01/20 0146 09/02/20 0851 09/02/20 0851 09/03/20 0722 09/03/20 1125  NA 136   < > 138  --  136  --  137  --  132* 135  K 3.9   < > 4.0   < > 4.3   < > 3.5   < > 3.7 4.0  CL 98  --  104  --  102  --  99  --  94*  --   CO2 24  --  25  --  23  --  28  --  23  --   GLUCOSE 221*  --  188*  --  243*  --  157*  --  255*  --   BUN 9  --  13  --  17  --  13  --  11  --   CREATININE 1.12*  --  0.89  --  0.83  --  0.92  --  0.97  --   CALCIUM 8.5*  --  8.4*  --  8.6*  --  8.8*  --  8.7*  --   MG  --   --  2.5*  --  2.5*  --  2.4  --  2.0  --   PHOS  --   --  2.6  --  2.6  --  2.7  --  2.9  --    < > = values in this interval not displayed.   Estimated Creatinine Clearance: 73.8 mL/min (by C-G formula based on SCr of 0.97 mg/dL).   LIVER Recent Labs  Lab 08/29/20 2248 08/31/20 0518 09/01/20 0146 09/02/20 0851 09/03/20 0722  AST 118* 104* 71* 51* 43*  ALT 56* 60* 60* 54* 48*  ALKPHOS 72 66 79 108 98  BILITOT 0.8 0.8 0.7 0.7 1.5*  PROT 7.1 6.4* 6.3* 6.6 6.7  ALBUMIN 2.9* 2.4* 2.6* 2.9* 2.8*     INFECTIOUS Recent Labs  Lab 08/29/20 2247 08/29/20 2248 08/30/20 0525  09/03/20 0722  LATICACIDVEN 1.9  --  2.3*  --   PROCALCITON  --  0.23  --  0.18     ENDOCRINE CBG (last 3)  Recent Labs    09/03/20 2216 09/04/20 0358 09/04/20 0734  GLUCAP 350* 212* 125*         IMAGING x48h  - image(s) personally visualized  -   highlighted in bold DG CHEST PORT 1 VIEW  Result Date: 09/03/2020 CLINICAL DATA:  Adult respiratory distress syndrome. Reported COVID-19 positive EXAM: PORTABLE CHEST 1 VIEW COMPARISON:  August 29, 2020 chest radiograph and chest CT August 30, 2020 FINDINGS: There is increased airspace opacity throughout the lungs bilaterally with a more even distribution of airspace opacity compared to prior studies. No consolidation. Heart is mildly enlarged with pulmonary vascularity normal. No adenopathy. No bone lesions. IMPRESSION: Persistent widespread airspace opacity bilaterally without areas of consolidation. Appearance consistent with multifocal atypical organism pneumonia. A degree of underlying ARDS may be present. Stable cardiac prominence. No adenopathy evident by radiography. Electronically Signed   By: Lowella Grip III M.D.   On: 09/03/2020 07:59   VAS [place] LOWER EXTREMITY VENOUS (DVT)  Result Date: 09/03/2020  Lower Venous DVTStudy Indications: Edema.  Limitations: Patient positioning. Comparison Study: no prior Performing Technologist: Abram Sander RVS  Examination Guidelines: A complete evaluation includes B-mode imaging, spectral Doppler, color Doppler, and power Doppler as needed of all accessible portions of each vessel. Bilateral testing is considered an integral part of a complete examination. Limited examinations for reoccurring indications may be performed as noted. The reflux portion of the exam is performed with the patient in reverse Trendelenburg.  +---------+---------------+---------+-----------+----------+--------------+ RIGHT    CompressibilityPhasicitySpontaneityPropertiesThrombus Aging  +---------+---------------+---------+-----------+----------+--------------+  CFV      Full           Yes      Yes                                 +---------+---------------+---------+-----------+----------+--------------+ SFJ      Full                                                        +---------+---------------+---------+-----------+----------+--------------+ FV Prox  Full                                                        +---------+---------------+---------+-----------+----------+--------------+ FV Mid   Full                                                        +---------+---------------+---------+-----------+----------+--------------+ FV DistalFull                                                        +---------+---------------+---------+-----------+----------+--------------+ PFV      Full                                                        +---------+---------------+---------+-----------+----------+--------------+ POP      Full           Yes      Yes                                 +---------+---------------+---------+-----------+----------+--------------+ PTV      Full                                                        +---------+---------------+---------+-----------+----------+--------------+ PERO     Full                                                        +---------+---------------+---------+-----------+----------+--------------+   +---------+---------------+---------+-----------+----------+--------------+ LEFT     CompressibilityPhasicitySpontaneityPropertiesThrombus Aging +---------+---------------+---------+-----------+----------+--------------+ CFV      Full           Yes      Yes                                 +---------+---------------+---------+-----------+----------+--------------+  SFJ      Full                                                         +---------+---------------+---------+-----------+----------+--------------+ FV Prox  Full                                                        +---------+---------------+---------+-----------+----------+--------------+ FV Mid   Full                                                        +---------+---------------+---------+-----------+----------+--------------+ FV DistalFull                                                        +---------+---------------+---------+-----------+----------+--------------+ PFV      Full                                                        +---------+---------------+---------+-----------+----------+--------------+ POP      Full           Yes      Yes                                 +---------+---------------+---------+-----------+----------+--------------+ PTV      Full                                                        +---------+---------------+---------+-----------+----------+--------------+ PERO                                                  Not visualized +---------+---------------+---------+-----------+----------+--------------+     Summary: BILATERAL: - No evidence of deep vein thrombosis seen in the lower extremities, bilaterally. - No evidence of superficial venous thrombosis in the lower extremities, bilaterally. -   *See table(s) above for measurements and observations. Electronically signed by Servando Snare MD on 09/03/2020 at 7:12:00 PM.    Final

## 2020-09-04 NOTE — Progress Notes (Signed)
SpO2 maintaining 82-86%on O2 55L HHFNC + 15L NRB while patient sleeping. Respirations remain tachy and seem more labored than with previous assessment. RT aware. Patient states that she tried to prone before but didn't work. Patient got up in chair after much encouragement. Patient irritable, stating that she is being told different things by different people. Patient denies SOB. SpO2 87-90% while this RN was in room and patient awake. Respirations less labored while patient awake. Patient resting in chair at this time, SpO2 88% on O2 55L HHFNC + 15L NRB. Will continue to monitor.

## 2020-09-04 NOTE — Progress Notes (Signed)
Patient was transferred to 2M16 per MD order. Nurse Kieth Brightly was called for report. Patient's daughter [name]) was called and informed about her transfer. She requested to talk with her mother's daughter. MD made aware.

## 2020-09-05 DIAGNOSIS — J1282 Pneumonia due to coronavirus disease 2019: Secondary | ICD-10-CM | POA: Diagnosis not present

## 2020-09-05 DIAGNOSIS — U071 COVID-19: Secondary | ICD-10-CM | POA: Diagnosis not present

## 2020-09-05 DIAGNOSIS — J9601 Acute respiratory failure with hypoxia: Secondary | ICD-10-CM | POA: Diagnosis not present

## 2020-09-05 LAB — COMPREHENSIVE METABOLIC PANEL
ALT: 44 U/L (ref 0–44)
AST: 40 U/L (ref 15–41)
Albumin: 2.6 g/dL — ABNORMAL LOW (ref 3.5–5.0)
Alkaline Phosphatase: 76 U/L (ref 38–126)
Anion gap: 14 (ref 5–15)
BUN: 20 mg/dL (ref 8–23)
CO2: 25 mmol/L (ref 22–32)
Calcium: 8.7 mg/dL — ABNORMAL LOW (ref 8.9–10.3)
Chloride: 99 mmol/L (ref 98–111)
Creatinine, Ser: 0.96 mg/dL (ref 0.44–1.00)
GFR calc Af Amer: >60 mL/min (ref >60)
GFR calc non Af Amer: >60 mL/min (ref >60)
Glucose, Bld: 130 mg/dL — ABNORMAL HIGH (ref 70–99)
Potassium: 4.6 mmol/L (ref 3.5–5.1)
Sodium: 138 mmol/L (ref 135–145)
Total Bilirubin: 0.9 mg/dL (ref 0.3–1.2)
Total Protein: 6.5 g/dL (ref 6.5–8.1)

## 2020-09-05 LAB — GLUCOSE, CAPILLARY
Glucose-Capillary: 116 mg/dL — ABNORMAL HIGH (ref 70–99)
Glucose-Capillary: 87 mg/dL (ref 70–99)
Glucose-Capillary: 135 mg/dL — ABNORMAL HIGH (ref 70–99)
Glucose-Capillary: 246 mg/dL — ABNORMAL HIGH (ref 70–99)
Glucose-Capillary: 255 mg/dL — ABNORMAL HIGH (ref 70–99)

## 2020-09-05 LAB — CBC
HCT: 38.4 % (ref 36.0–46.0)
Hemoglobin: 11.6 g/dL — ABNORMAL LOW (ref 12.0–15.0)
MCH: 25.9 pg — ABNORMAL LOW (ref 26.0–34.0)
MCHC: 30.2 g/dL (ref 30.0–36.0)
MCV: 85.7 fL (ref 80.0–100.0)
Platelets: 457 10*3/uL — ABNORMAL HIGH (ref 150–400)
RBC: 4.48 MIL/uL (ref 3.87–5.11)
RDW: 12.9 % (ref 11.5–15.5)
WBC: 20.2 10*3/uL — ABNORMAL HIGH (ref 4.0–10.5)
nRBC: 0.3 % — ABNORMAL HIGH (ref 0.0–0.2)

## 2020-09-05 LAB — PHOSPHORUS: Phosphorus: 5 mg/dL — ABNORMAL HIGH (ref 2.5–4.6)

## 2020-09-05 LAB — D-DIMER, QUANTITATIVE: D-Dimer, Quant: 5.32 ug/mL-FEU — ABNORMAL HIGH (ref 0.00–0.50)

## 2020-09-05 LAB — MAGNESIUM: Magnesium: 2.6 mg/dL — ABNORMAL HIGH (ref 1.7–2.4)

## 2020-09-05 MED ORDER — ENOXAPARIN SODIUM 60 MG/0.6ML ~~LOC~~ SOLN
50.0000 mg | SUBCUTANEOUS | Status: DC
  Administered 2020-09-05 – 2020-09-08 (×4): 50 mg via SUBCUTANEOUS
  Filled 2020-09-05: qty 0.5
  Filled 2020-09-05: qty 0.6
  Filled 2020-09-05 (×3): qty 0.5

## 2020-09-05 MED ORDER — METHYLPREDNISOLONE SODIUM SUCC 125 MG IJ SOLR
80.0000 mg | Freq: Every day | INTRAMUSCULAR | Status: DC
  Administered 2020-09-06 – 2020-09-09 (×4): 80 mg via INTRAVENOUS
  Filled 2020-09-05 (×4): qty 2

## 2020-09-05 NOTE — Progress Notes (Signed)
Taken off BiPAP and placed on 50L HFNC and NRB. Seems to be tolerating well. RT to monitor as needed

## 2020-09-05 NOTE — Progress Notes (Signed)
ANTICOAGULATION CONSULT NOTE - Initial Consult  Pharmacy Consult for Lovenox Indication: DVT prophylaxis  Allergies  Allergen Reactions  . Penicillins Rash    Patient Measurements: Height: 5\' 4"  (162.6 cm) Weight: 106.9 kg (235 lb 10.8 oz) IBW/kg (Calculated) : 54.7 Lovenox Dosing Weight: 115.8 kg  Vital Signs: Temp: 99.7 F (37.6 C) (09/25 1000) Temp Source: Axillary (09/25 1000) BP: 98/62 (09/25 1100) Pulse Rate: 99 (09/25 1100)  Labs: Recent Labs    09/03/20 0722 09/03/20 0722 09/03/20 1125 09/03/20 1125 09/04/20 1206 09/05/20 0246  HGB 12.0   < > 12.6   < > 11.6* 11.6*  HCT 38.7   < > 37.0  --  37.6 38.4  PLT 338  --   --   --  419* 457*  CREATININE 0.97  --   --   --  1.04* 0.96   < > = values in this interval not displayed.    Estimated Creatinine Clearance: 72.5 mL/min (by C-G formula based on SCr of 0.96 mg/dL).   Medical History: Past Medical History:  Diagnosis Date  . Bipolar 1 disorder ([place])   . 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 Obesity    Assessment:  64 yr old with COVID-19 pneumonia. Has been on Lovenox ~0.5 mg/kg for VTE prophylaxis.  D-dimer 1.58 on admit, has trended up to >20 today, to increase to therapeutic Lovenox dosing.  LE duplex pending. CTA negative for PE on 9/19.  Ddimer is trending down some hemoptysis per Rn. D/w Dr Chase Caller and we will reduce the lovenox to prophylaxis dosing. BMI 41  Goal of Therapy:  Anti-Xa 0.3-0.6 Monitor platelets by anticoagulation protocol: Yes   Plan:  Change Lovenox to 50mg  SQ q24 F/u for bleeding  Onnie Boer, PharmD, BCIDP, AAHIVP, CPP Infectious Disease Pharmacist 09/05/2020 11:58 AM

## 2020-09-05 NOTE — Progress Notes (Signed)
Patient asked to be off BiPAP. Trialed on 50L HFNC and sat in the 70's. Placed back on BiPAP

## 2020-09-05 NOTE — Progress Notes (Signed)
NAME:  Wendy Ruiz, MRN:  [id number], DOB:  [birth year], LOS: 6 ADMISSION DATE:  08/29/2020, CONSULTATION DATE: 09/05/20 REFERRING MD:  Candee Furbish, MD CHIEF COMPLAINT: Shortness of breath  Brief History    Patient noted feeling ill a week to week and a half ago.  Malaise, fever.  Significant fatigue.  Noted some labored breathing particularly when lying flat.  Also more labored when walking around the house.  Children have been coming to the house to check on her as she is not been feeling well.  She went to sleep last night and awoke to EMS in her room.  Sounds like she had episode of syncope and did not hear his children banging on the door.  Therefore rescue squad was called.  She was transported to the ED.  Chest x-ray with bilateral infiltrates.  Severely hypoxemic requiring nonrebreather.  Desaturation with movement when lying flat.  CTA performed which showed near confluent dense groundglass opacities throughout all lung fields.  No PE.  Oxygenation and respiratory rate seems to improved with the addition of high flow nasal cannula to nonrebreather.  She feels more comfortable sitting upright in the chair as opposed to laying in stretcher.  Did try to prone for a little bit but felt her respiratory distress worsened and her oxygen saturation worsened per RN.  Past Medical History  GERD  has a past medical history of Bipolar 1 disorder ([place]), Chronic pain due to trauma, and Obesity.   has no past surgical history on file.   Significant Hospital Events   9/19 admitted 09/02/2020: Switch to full dose Lovenox empiric treatment because of increasing D-dimer 09/03/2020: CCM primary from family practice teaching service because of concern of decompensation.  Remains in 5 W.  9/24 - he says she is better but nursing notes indicate that she might not be better.  She is on 55 L heated high flow nasal cannula at 100% along with nonrebreather.  Pulse ox is in the 80s.  2 days ago it was in  the 90s.  She is having some hemoptysis with thick gray sputum.  This seems to be stable.  But she is sitting and watching TV.  She is unable to prone. Had slight fever 101 on September 02, 2020 but currently afebrile although white count is up at 24.6  Consults:  PCCM  Procedures:   Significant Diagnostic Tests:  CTA 9/19 with no PE, near confluent dense groundglass opacities in all lobes of bilateral lungs 09/02/2019 one 2D echo essentially unremarkable  923 lower extremity Doppler studies-> neg  Micro Data:  MRSA PCR 08/30/2020: Negative SARS-CoV-2 08/30/2020: Positive Blood culture 09/03/2020: Urine strep 09/04/2020 - neg Urine Legionella 09/04/2020  Antimicrobials:  Remdesivir 9/19 Baricitinib 9/19  (14 days) Solumedrol 9.23 125mg  daily -changed to 80 mg on 09/05/2020 xxx Azithromycin 09/03/2020>> 09/04/2020 Cefepime 09/03/2020 >>   Interim history/subjective:    09/05/2020-.  On therapeutic dose Lovenox.  Currently into MICU bed 16.  Huge improvement in D-dimer.  She is beginning to feel better.  However she is BiPAP dependent.  With her BiPAP she is desaturating.  She is feels better.  She wants to eat all the nursing is concerned that she can only tolerate clear liquid diet under supervision because otherwise she would desaturate.  Her procalcitonin was slightly elevated and is improving.  Her urine Streptococcus is negative.  Her fever profile and white count are improving.  She is still on Solu-Medrol.  Blood sugars are adequate  according to nursing.  Has had some epistaxis and nursing withheld Lovenox.  D-dimer is getting back to her recent baseline.  Duplex negative for DVT.   Results for ALYSIA, SCISM (MRN [id number]) as of 09/05/2020 11:55  Ref. Range 08/29/2020 22:48 08/31/2020 05:18 09/01/2020 01:46 09/02/2020 08:51 09/03/2020 07:22 09/05/2020 06:43  D-Dimer, Quant Latest Ref Range: 0.00 - 0.50 ug/mL-FEU 1.58 (H) 1.87 (H) 3.42 (H) >20.00 (H) >20.00 (H) 5.32 (H)     Objective   Blood pressure 98/62, pulse 99, temperature 99.7 F (37.6 C), temperature source Axillary, resp. rate (!) 30, height 5\' 4"  (1.626 m), weight 106.9 kg, SpO2 92 %.    Vent Mode: BIPAP;PSV FiO2 (%):  [80 %-100 %] 100 % Set Rate:  [8 bmp] 8 bmp PEEP:  [8 cmH20] 8 cmH20   Intake/Output Summary (Last 24 hours) at 09/05/2020 1154 Last data filed at 09/05/2020 1000 Gross per 24 hour  Intake 315.58 ml  Output 1200 ml  Net -884.42 ml   Filed Weights   09/01/20 0518 09/03/20 1103 09/04/20 1500  Weight: 115.8 kg 112.2 kg 106.9 kg   Morbidly obese female in isolation room in bed in ICU to [facility] [place] bed 16.  She is on BiPAP.  She is using a phone.  She actually looks better.  Her pulse ox is the best it has ever been at 97% but this is on BiPAP.  She is alert and oriented x3.  Mild respiratory distress only.  No cyanosis no clubbing no edema.  Abdomen soft clear to auscultation.  Respiratory distress looks better.  Normal heart sounds.[facility] Problem list   n/a  Assessment & Plan:  Acute hypoxic respiratory failure secondary to ARDS in setting of COVID-19 pneumonia  09/05/2020: Clinically looks better but is BiPAP dependent otherwise she desaturates.  She is noted to have overestimation of her pulse ox compared to her PO2 [?  Due to racial pulse ox Based on NEJM study]  Plan -Keep pulse ox greater than 92% ideally - -BiPAP scheduled [currently not tolerating breaks] -Check blood gas -Intubate if gets worse [full code confirmed]  COVID-19 -status post remdesivir  Plan = Continue steroids and olumiant-but reduce steroid dose on 09/05/2020 -Change Lovenox to DVT prophylaxis dose [had some epistaxis] -Check Covid IgG antibody September 05, 2020  Fever 09/02/2020  -Seems to have responded to antibiotic with decline in procalcitonin.  Culture negative.  Urine strep negative so far  Plan  --Continue cephalosporin  Anemia of ICU  09/05/2020 - mild epistakex but  resolved  Plan - - PRBC for hgb </= 6.9gm%    - exceptions are   -  if ACS susepcted/confirmed then transfuse for hgb </= 8.0gm%,  or    -  active bleeding with hemodynamic instability, then transfuse regardless of hemoglobin value   At at all times try to transfuse 1 unit prbc as possible with exception of active hemorrhage  Over-the-counter patient fibrosis but if you need to change it go ahead go ahead and   Hyperglycemia Sliding scale insulin -   Best practice:  Diet: N.p.o. except meds.  Clear liquid diet as tolerated in the setting of nursing supervision  pain/Anxiety/Delirium protocol (if indicated): None VAP protocol (if indicated): Sit in bed with head elevation DVT prophylaxis: Treatment dose Lovenox empiric-> changed on 09/05/2020 tp  prophylactic dose Lovenox because of epistaxis and improved D-dimer GI prophylaxis: PPI Glucose control: ssi Mobility: bed rest,  Code Status: full Family Communication: pateint Disposition:  [id number]        ATTESTATION & SIGNATURE   The patient NORMAGENE HARVIE is critically ill with multiple organ systems failure and requires high complexity decision making for assessment and support, frequent evaluation and titration of therapies, application of advanced monitoring technologies and extensive interpretation of multiple databases.   Critical Care Time devoted to patient care services described in this note is  35  Minutes. This time reflects time of care of this signee Dr Brand Males. This critical care time does not reflect procedure time, or teaching time or supervisory time of PA/NP/Med student/Med Resident etc but could involve care discussion time     Dr. Brand Males, M.D., [facility].C.P Pulmonary and Critical Care Medicine Staff Physician [place] [facility] Pager: [phone], If no answer or between  15:00h - 7:00h: call [phone]  09/05/2020 12:01 PM    LABS     PULMONARY Recent Labs  Lab 09/02/20 1847 09/03/20 1125 09/04/20 0913  PHART 7.464* 7.512* 7.497*  PCO2ART 38.9 35.1 38.9  PO2ART 53.5* 37* 40.3*  HCO3 27.4 28.2* 29.8*  TCO2  --  29  --   O2SAT 86.9 76.0 75.0    CBC Recent Labs  Lab 09/03/20 0722 09/03/20 0722 09/03/20 1125 09/04/20 1206 09/05/20 0246  HGB 12.0   < > 12.6 11.6* 11.6*  HCT 38.7   < > 37.0 37.6 38.4  WBC 24.6*  --   --  28.3* 20.2*  PLT 338  --   --  419* 457*   < > = values in this interval not displayed.    COAGULATION No results for input(s): INR in the last 168 hours.  CARDIAC  No results for input(s): TROPONINI in the last 168 hours. No results for input(s): PROBNP in the last 168 hours.   CHEMISTRY Recent Labs  Lab 09/01/20 0146 09/01/20 0146 09/02/20 0851 09/02/20 0851 09/03/20 0722 09/03/20 0722 09/03/20 1125 09/03/20 1125 09/04/20 1206 09/05/20 0246  NA 136   < > 137  --  132*  --  135  --  136 138  K 4.3   < > 3.5   < > 3.7   < > 4.0   < > 4.1 4.6  CL 102  --  99  --  94*  --   --   --  97* 99  CO2 23  --  28  --  23  --   --   --  24 25  GLUCOSE 243*  --  157*  --  255*  --   --   --  187* 130*  BUN 17  --  13  --  11  --   --   --  20 20  CREATININE 0.83  --  0.92  --  0.97  --   --   --  1.04* 0.96  CALCIUM 8.6*  --  8.8*  --  8.7*  --   --   --  8.7* 8.7*  MG 2.5*  --  2.4  --  2.0  --   --   --  2.3 2.6*  PHOS 2.6  --  2.7  --  2.9  --   --   --  4.1 5.0*   < > = values in this interval not displayed.   Estimated Creatinine Clearance: 72.5 mL/min (by C-G formula based on SCr of 0.96 mg/dL).   LIVER Recent Labs  Lab  09/01/20 0146 09/02/20 0851 09/03/20 0722 09/04/20 1206 09/05/20 0246  AST 71* 51* 43* 43* 40  ALT 60* 54* 48* 46* 44  ALKPHOS 79 108 98 90 76  BILITOT 0.7 0.7 1.5* 0.7 0.9  PROT 6.3* 6.6 6.7 6.8 6.5  ALBUMIN 2.6* 2.9* 2.8* 2.8* 2.6*     INFECTIOUS Recent Labs  Lab 08/29/20 2247 08/29/20 2248 08/30/20 0525 09/03/20 0722   LATICACIDVEN 1.9  --  2.3*  --   PROCALCITON  --  0.23  --  0.18     ENDOCRINE CBG (last 3)  Recent Labs    09/05/20 0341 09/05/20 0828 09/05/20 1110  GLUCAP 116* 87 135*         IMAGING x48h  - image(s) personally visualized  -   highlighted in bold No results found.

## 2020-09-06 ENCOUNTER — Encounter (HOSPITAL_COMMUNITY): Payer: Self-pay

## 2020-09-06 DIAGNOSIS — J1282 Pneumonia due to coronavirus disease 2019: Secondary | ICD-10-CM | POA: Diagnosis not present

## 2020-09-06 DIAGNOSIS — U071 COVID-19: Secondary | ICD-10-CM | POA: Diagnosis not present

## 2020-09-06 DIAGNOSIS — J9601 Acute respiratory failure with hypoxia: Secondary | ICD-10-CM | POA: Diagnosis not present

## 2020-09-06 LAB — COMPREHENSIVE METABOLIC PANEL
ALT: 39 U/L (ref 0–44)
AST: 30 U/L (ref 15–41)
Albumin: 2.5 g/dL — ABNORMAL LOW (ref 3.5–5.0)
Alkaline Phosphatase: 69 U/L (ref 38–126)
Anion gap: 12 (ref 5–15)
BUN: 21 mg/dL (ref 8–23)
CO2: 24 mmol/L (ref 22–32)
Calcium: 8.9 mg/dL (ref 8.9–10.3)
Chloride: 102 mmol/L (ref 98–111)
Creatinine, Ser: 1 mg/dL (ref 0.44–1.00)
GFR calc Af Amer: >60 mL/min (ref >60)
GFR calc non Af Amer: >60 mL/min (ref >60)
Glucose, Bld: 86 mg/dL (ref 70–99)
Potassium: 4.6 mmol/L (ref 3.5–5.1)
Sodium: 138 mmol/L (ref 135–145)
Total Bilirubin: 0.9 mg/dL (ref 0.3–1.2)
Total Protein: 6.4 g/dL — ABNORMAL LOW (ref 6.5–8.1)

## 2020-09-06 LAB — GLUCOSE, CAPILLARY
Glucose-Capillary: 113 mg/dL — ABNORMAL HIGH (ref 70–99)
Glucose-Capillary: 162 mg/dL — ABNORMAL HIGH (ref 70–99)
Glucose-Capillary: 164 mg/dL — ABNORMAL HIGH (ref 70–99)
Glucose-Capillary: 175 mg/dL — ABNORMAL HIGH (ref 70–99)
Glucose-Capillary: 209 mg/dL — ABNORMAL HIGH (ref 70–99)
Glucose-Capillary: 293 mg/dL — ABNORMAL HIGH (ref 70–99)
Glucose-Capillary: 70 mg/dL (ref 70–99)

## 2020-09-06 LAB — CBC
HCT: 37.8 % (ref 36.0–46.0)
Hemoglobin: 11.2 g/dL — ABNORMAL LOW (ref 12.0–15.0)
MCH: 25.9 pg — ABNORMAL LOW (ref 26.0–34.0)
MCHC: 29.6 g/dL — ABNORMAL LOW (ref 30.0–36.0)
MCV: 87.5 fL (ref 80.0–100.0)
Platelets: 504 10*3/uL — ABNORMAL HIGH (ref 150–400)
RBC: 4.32 MIL/uL (ref 3.87–5.11)
RDW: 12.6 % (ref 11.5–15.5)
WBC: 22.9 10*3/uL — ABNORMAL HIGH (ref 4.0–10.5)
nRBC: 0.1 % (ref 0.0–0.2)

## 2020-09-06 LAB — D-DIMER, QUANTITATIVE: D-Dimer, Quant: 3.46 ug/mL-FEU — ABNORMAL HIGH (ref 0.00–0.50)

## 2020-09-06 LAB — PHOSPHORUS: Phosphorus: 3.9 mg/dL (ref 2.5–4.6)

## 2020-09-06 LAB — LEGIONELLA PNEUMOPHILA SEROGP 1 UR AG: L. pneumophila Serogp 1 Ur Ag: NEGATIVE

## 2020-09-06 LAB — SAR COV2 SEROLOGY (COVID19)AB(IGG),IA: SARS-CoV-2 Ab, IgG: REACTIVE — AB

## 2020-09-06 NOTE — Progress Notes (Addendum)
NAME:  Wendy Ruiz, MRN:  [id number], DOB:  February 20, 1956, LOS: 7 ADMISSION DATE:  08/29/2020, CONSULTATION DATE: 09/06/20 REFERRING MD:  Candee Furbish, MD CHIEF COMPLAINT: Shortness of breath  Brief History    Patient noted feeling ill a week to week and a half ago.  Malaise, fever.  Significant fatigue.  Noted some labored breathing particularly when lying flat.  Also more labored when walking around the house.  Children have been coming to the house to check on her as she is not been feeling well.  She went to sleep last night and awoke to EMS in her room.  Sounds like she had episode of syncope and did not hear his children banging on the door.  Therefore rescue squad was called.  She was transported to the ED.  Chest x-ray with bilateral infiltrates.  Severely hypoxemic requiring nonrebreather.  Desaturation with movement when lying flat.  CTA performed which showed near confluent dense groundglass opacities throughout all lung fields.  No PE.  Oxygenation and respiratory rate seems to improved with the addition of high flow nasal cannula to nonrebreather.  She feels more comfortable sitting upright in the chair as opposed to laying in stretcher.  Did try to prone for a little bit but felt her respiratory distress worsened and her oxygen saturation worsened per RN.  Past Medical History  GERD  has a past medical history of Bipolar 1 disorder ([place]), Chronic pain due to trauma, and Obesity.   has no past surgical history on file.   Significant Hospital Events   9/19 admitted 09/02/2020: Switch to full dose Lovenox empiric treatment because of increasing D-dimer 09/03/2020: CCM primary from family practice teaching service because of concern of decompensation.  Remains in 5 W.  9/24 - he says she is better but nursing notes indicate that she might not be better.  She is on 55 L heated high flow nasal cannula at 100% along with nonrebreather.  Pulse ox is in the 80s.  2 days ago it was in  the 90s.  She is having some hemoptysis with thick gray sputum.  This seems to be stable.  But she is sitting and watching TV.  She is unable to prone. Had slight fever 101 on September 02, 2020 but currently afebrile although white count is up at 24.6  9/25 - On therapeutic dose Lovenox.  Currently into MICU bed 16.  Huge improvement in D-dimer.  She is beginning to feel better.  However she is BiPAP dependent.  With her BiPAP she is desaturating.  She is feels better.  She wants to eat all the nursing is concerned that she can only tolerate clear liquid diet under supervision because otherwise she would desaturate.  Her procalcitonin was slightly elevated and is improving.  Her urine Streptococcus is negative.  Her fever profile and white count are improving.  She is still on Solu-Medrol.  Blood sugars are adequate according to nursing. Has had some epistaxis and nursing withheld Lovenox.  D-dimer is getting back to her recent baseline.  Duplex negative for DVT.  Afebrile   Consults:  PCCM  Procedures:   Significant Diagnostic Tests:  CTA 9/19 with no PE, near confluent dense groundglass opacities in all lobes of bilateral lungs 09/02/2019 one 2D echo essentially unremarkable  923 lower extremity Doppler studies-> neg 9/27 - repeat duplex  - planned  Micro Data:  MRSA PCR 08/30/2020: Negative QuantiFERON gold 08/30/2020: Indetermindate SARS-CoV-2 08/30/2020: Positive Blood culture 09/03/2020: neg Urine  strep 09/04/2020 - neg Urine Legionella 09/04/2020 -  IgG antibody COVID 9/25 - Positive  Antimicrobials:  Remdesivir 9/19 Baricitinib 9/19  (14 days) Solumedrol 9.23 125mg  daily -changed to 80 mg on 09/05/2020 xxx Azithromycin 09/03/2020>> 09/04/2020 Cefepime 09/03/2020 >> [7 days]   Interim history/subjective:    09/06/2020-.  Further improvements in D-dimer.  She is beginning to feel overall better but she says the cough is still disabling.  She has BiPAP at night.  Currently she is  resting.  She feels energy levels have improved.  Her creatinine has improved.  No further epistaxis reported.  She is on prophylactic dose Lovenox in obese people.   Results for Wendy Ruiz (MRN [id number]) as of 09/06/2020 09:35  Ref. Range 09/01/2020 01:46 09/02/2020 08:51 09/03/2020 07:22 09/05/2020 06:43 09/06/2020 07:29  D-Dimer, Quant Latest Ref Range: 0.00 - 0.50 ug/mL-FEU 3.42 (H) >20.00 (H) >20.00 (H) 5.32 (H) 3.46 (H)   Objective   Blood pressure 104/72, pulse 78, temperature 98.5 F (36.9 C), temperature source Axillary, resp. rate (!) 23, height 5\' 4"  (1.626 m), weight 106.9 kg, SpO2 92 %.    Vent Mode: PCV;BIPAP FiO2 (%):  [100 %] 100 % Set Rate:  [8 bmp] 8 bmp PEEP:  [8 cmH20] 8 cmH20   Intake/Output Summary (Last 24 hours) at 09/06/2020 0935 Last data filed at 09/06/2020 0400 Gross per 24 hour  Intake 390 ml  Output 1250 ml  Net -860 ml   Filed Weights   09/01/20 0518 09/03/20 1103 09/04/20 1500  Weight: 115.8 kg 112.2 kg 106.9 kg   Morbidly obese female in isolation room into [place] ICU bed 16.  Currently she is on heated high flow nasal cannula nonrebreather.  She did have some cough.  Pulse ox is 88% on this.  Mildly tachycardic respiratory breath sounds are distant.  Normal heart sounds abdomen soft no cyanosis no clubbing no edema.  Resolved Hospital Problem list   n/a  Assessment & Plan:  Acute hypoxic respiratory failure secondary to ARDS in setting of COVID-19 pneumonia  09/06/2020: Clinically looks better and is able to be off BiPAP this morning but pulse ox is around percent [she seems to have relatively high overestimation of pulse ox notify the PO2-?  Racial gap]   Plan -Keep pulse ox greater than 92% ideally - -BiPAP scheduled -2 hours on the morning with 4-hour break and definitely at night -Intubate if gets worse   COVID-19 -status post remdesivir  -Improving D-dimer profile on 09/06/2020.  Plan = Continue steroids and olumiant-but reduce  steroid dose on 09/05/2020; needs to establish steroids stop date -Changed Lovenox to DVT prophylaxis dose on 09/05/2020 [had some epistaxis] -Repeat duplex lower extremities 09/07/2020   Fever 09/02/2020  -Seems to have responded to antibiotic with decline in procalcitonin.  Culture negative.  Urine strep negative so far  Plan  --Continue empiric cefepime since 09/03/10; provisional stop date established for 7 days -but could stop earlier if culture negative  Anemia of ICU  09/06/2020 - mild epistasixx but resolved same day 09/05/2020  Plan - - PRBC for hgb </= 6.9gm%    - exceptions are   -  if ACS susepcted/confirmed then transfuse for hgb </= 8.0gm%,  or    -  active bleeding with hemodynamic instability, then transfuse regardless of hemoglobin value   At at all times try to transfuse 1 unit prbc as possible with exception of active hemorrhage     Hyperglycemia Sliding scale insulin -   Best  practice:  Diet: N.p.o. except meds.  Clear liquid diet as tolerated in the setting of nursing supervision  pain/Anxiety/Delirium protocol (if indicated): None VAP protocol (if indicated): Sit in bed with head elevation DVT prophylaxis: Preventative dose Lovenox  => approximately 09/02/2020  =. treatment dose Lovenox empiric-> changed on 09/05/2020 tp  prophylactic dose Lovenox because of epistaxis and improved D-dimer  GI prophylaxis: PPI Glucose control: ssi Mobility: bed rest,  Code Status: full Family Communication: pateint and later Benay Pike [phone]. - daughter wanting daily updates Disposition: 67m16         ATTESTATION & SIGNATURE   The patient Wendy Ruiz is critically ill with multiple organ systems failure and requires high complexity decision making for assessment and support, frequent evaluation and titration of therapies, application of advanced monitoring technologies and extensive interpretation of multiple databases.   Critical Care Time devoted to  patient care services described in this note is  35  Minutes. This time reflects time of care of this signee Dr Brand Males. This critical care time does not reflect procedure time, or teaching time or supervisory time of PA/NP/Med student/Med Resident etc but could involve care discussion time     Dr. Brand Males, M.D., [facility].C.P Pulmonary and Critical Care Medicine Staff Physician [place] [facility] Pager: [phone], If no answer or between  15:00h - 7:00h: call [phone]  09/06/2020 9:48 AM     LABS    PULMONARY Recent Labs  Lab 09/02/20 1847 09/03/20 1125 09/04/20 0913  PHART 7.464* 7.512* 7.497*  PCO2ART 38.9 35.1 38.9  PO2ART 53.5* 37* 40.3*  HCO3 27.4 28.2* 29.8*  TCO2  --  29  --   O2SAT 86.9 76.0 75.0    CBC Recent Labs  Lab 09/04/20 1206 09/05/20 0246 09/06/20 0729  HGB 11.6* 11.6* 11.2*  HCT 37.6 38.4 37.8  WBC 28.3* 20.2* 22.9*  PLT 419* 457* 504*    COAGULATION No results for input(s): INR in the last 168 hours.  CARDIAC  No results for input(s): TROPONINI in the last 168 hours. No results for input(s): PROBNP in the last 168 hours.   CHEMISTRY Recent Labs  Lab 09/01/20 0146 09/01/20 0146 09/02/20 0851 09/02/20 0851 09/03/20 0722 09/03/20 0722 09/03/20 1125 09/03/20 1125 09/04/20 1206 09/04/20 1206 09/05/20 0246 09/06/20 0729  NA 136   < > 137   < > 132*  --  135  --  136  --  138 138  K 4.3   < > 3.5   < > 3.7   < > 4.0   < > 4.1   < > 4.6 4.6  CL 102   < > 99  --  94*  --   --   --  97*  --  99 102  CO2 23   < > 28  --  23  --   --   --  24  --  25 24  GLUCOSE 243*   < > 157*  --  255*  --   --   --  187*  --  130* 86  BUN 17   < > 13  --  11  --   --   --  20  --  20 21  CREATININE 0.83   < > 0.92  --  0.97  --   --   --  1.04*  --  0.96 1.00  CALCIUM 8.6*   < >  8.8*  --  8.7*  --   --   --  8.7*  --  8.7* 8.9  MG 2.5*  --  2.4  --  2.0  --   --   --  2.3  --  2.6*  --    PHOS 2.6   < > 2.7  --  2.9  --   --   --  4.1  --  5.0* 3.9   < > = values in this interval not displayed.   Estimated Creatinine Clearance: 69.6 mL/min (by C-G formula based on SCr of 1 mg/dL).   LIVER Recent Labs  Lab 09/02/20 0851 09/03/20 0722 09/04/20 1206 09/05/20 0246 09/06/20 0729  AST 51* 43* 43* 40 30  ALT 54* 48* 46* 44 39  ALKPHOS 108 98 90 76 69  BILITOT 0.7 1.5* 0.7 0.9 0.9  PROT 6.6 6.7 6.8 6.5 6.4*  ALBUMIN 2.9* 2.8* 2.8* 2.6* 2.5*     INFECTIOUS Recent Labs  Lab 09/03/20 0722  PROCALCITON 0.18     ENDOCRINE CBG (last 3)  Recent Labs    09/06/20 0033 09/06/20 0359 09/06/20 0811  GLUCAP 164* 113* 70         IMAGING x48h  - image(s) personally visualized  -   highlighted in bold No results found.

## 2020-09-06 NOTE — Progress Notes (Signed)
Pharmacy Antibiotic Note  Wendy Ruiz is a 64 y.o. female admitted on 08/29/2020 with COVID. The patient's clinical status is now worsening along with increased oxygen requirements and there is new concern for pneumonia. The patient is newly febrile with a Tmax of 100.4.   She is on D4 of cefepime for PNA coverage. Her wbc remains elevated but she is on steroids. Remains afebrile. Repeat blood culture remain neg. Plan is for 5-7d of cefepime. Renal function stable.    Plan: - Cont cefepime 2 g IV q8h hours - Monitor clinical status, renal function cultures   Height: 5\' 4"  (162.6 cm) Weight: 106.9 kg (235 lb 10.8 oz) IBW/kg (Calculated) : 54.7  Temp (24hrs), Avg:98.4 F (36.9 C), Min:98.1 F (36.7 C), Max:98.7 F (37.1 C)  Recent Labs  Lab 09/02/20 0851 09/03/20 0722 09/04/20 1206 09/05/20 0246 09/06/20 0729  WBC 19.1* 24.6* 28.3* 20.2* 22.9*  CREATININE 0.92 0.97 1.04* 0.96 1.00    Estimated Creatinine Clearance: 69.6 mL/min (by C-G formula based on SCr of 1 mg/dL).    Allergies  Allergen Reactions  . Penicillins Rash    Antimicrobials this admission: 9/23 cefepime >> 9/23 azithromycin >>9/23  9/19 remdesivir >> 9/23 9/19 baracitinib >> (10/2)  Microbiology results: 9/18 BCx: NGTDF 9/23 blood>>ngd 9/19 MRSA PCR: negative   Onnie Boer, PharmD, BCIDP, AAHIVP, CPP Infectious Disease Pharmacist 09/06/2020 11:18 AM

## 2020-09-06 NOTE — Progress Notes (Signed)
eLink Physician-Brief Progress Note Patient Name: Wendy Ruiz DOB: 1956-09-13 MRN: [id number]   Date of Service  09/06/2020  HPI/Events of Note  Notified that CBG > 200 for 2 determinations. Attributing this to being given several sugary drinks today for CBG of 70 this morning.  eICU Interventions  May hold off on insulin drip for now and continue monitoring     Intervention Category Major Interventions: Hyperglycemia - active titration of insulin therapy  Shona Needles Brynlyn Dade 09/06/2020, 8:02 PM

## 2020-09-06 NOTE — Progress Notes (Signed)
Taken off BiPAP and placed on 50L HF and NRB. Tolerating well

## 2020-09-07 DIAGNOSIS — U071 COVID-19: Secondary | ICD-10-CM | POA: Diagnosis not present

## 2020-09-07 DIAGNOSIS — J80 Acute respiratory distress syndrome: Secondary | ICD-10-CM | POA: Diagnosis not present

## 2020-09-07 LAB — GLUCOSE, CAPILLARY
Glucose-Capillary: 108 mg/dL — ABNORMAL HIGH (ref 70–99)
Glucose-Capillary: 73 mg/dL (ref 70–99)
Glucose-Capillary: 124 mg/dL — ABNORMAL HIGH (ref 70–99)
Glucose-Capillary: 197 mg/dL — ABNORMAL HIGH (ref 70–99)
Glucose-Capillary: 236 mg/dL — ABNORMAL HIGH (ref 70–99)
Glucose-Capillary: 197 mg/dL — ABNORMAL HIGH (ref 70–99)

## 2020-09-07 LAB — COMPREHENSIVE METABOLIC PANEL
ALT: 49 U/L — ABNORMAL HIGH (ref 0–44)
AST: 43 U/L — ABNORMAL HIGH (ref 15–41)
Albumin: 2.3 g/dL — ABNORMAL LOW (ref 3.5–5.0)
Alkaline Phosphatase: 78 U/L (ref 38–126)
Anion gap: 11 (ref 5–15)
BUN: 15 mg/dL (ref 8–23)
CO2: 23 mmol/L (ref 22–32)
Calcium: 8.7 mg/dL — ABNORMAL LOW (ref 8.9–10.3)
Chloride: 100 mmol/L (ref 98–111)
Creatinine, Ser: 0.94 mg/dL (ref 0.44–1.00)
GFR calc Af Amer: >60 mL/min (ref >60)
GFR calc non Af Amer: >60 mL/min (ref >60)
Glucose, Bld: 227 mg/dL — ABNORMAL HIGH (ref 70–99)
Potassium: 4.9 mmol/L (ref 3.5–5.1)
Sodium: 134 mmol/L — ABNORMAL LOW (ref 135–145)
Total Bilirubin: 0.7 mg/dL (ref 0.3–1.2)
Total Protein: 6.5 g/dL (ref 6.5–8.1)

## 2020-09-07 LAB — PHOSPHORUS: Phosphorus: 3.6 mg/dL (ref 2.5–4.6)

## 2020-09-07 LAB — MAGNESIUM: Magnesium: 2.4 mg/dL (ref 1.7–2.4)

## 2020-09-07 NOTE — Progress Notes (Signed)
NAME:  Wendy Ruiz, MRN:  [id number], DOB:  13-Feb-1956, LOS: 8 ADMISSION DATE:  08/29/2020, CONSULTATION DATE: 09/07/20 REFERRING MD:  Kipp Brood, MD CHIEF COMPLAINT: Shortness of breath  Brief History    Patient noted feeling ill a week to week and a half ago.  Malaise, fever.  Significant fatigue.  Noted some labored breathing particularly when lying flat.  Also more labored when walking around the house.  Children have been coming to the house to check on her as she is not been feeling well.  She went to sleep last night and awoke to EMS in her room.  Sounds like she had episode of syncope and did not hear his children banging on the door.  Therefore rescue squad was called.  She was transported to the ED.  Chest x-ray with bilateral infiltrates.  Severely hypoxemic requiring nonrebreather.  Desaturation with movement when lying flat.  CTA performed which showed near confluent dense groundglass opacities throughout all lung fields.  No PE.  Oxygenation and respiratory rate seems to improved with the addition of high flow nasal cannula to nonrebreather.  She feels more comfortable sitting upright in the chair as opposed to laying in stretcher.  Did try to prone for a little bit but felt her respiratory distress worsened and her oxygen saturation worsened per RN.  Past Medical History  GERD  has a past medical history of Bipolar 1 disorder ([place]), Chronic pain due to trauma, and Obesity.   has no past surgical history on file.   Significant Hospital Events   9/19 admitted 09/02/2020: Switch to full dose Lovenox empiric treatment because of increasing D-dimer 09/03/2020: CCM primary from family practice teaching service because of concern of decompensation.  Remains in 5 W.  9/24 - he says she is better but nursing notes indicate that she might not be better.  She is on 55 L heated high flow nasal cannula at 100% along with nonrebreather.  Pulse ox is in the 80s.  2 days ago it was in  the 90s.  She is having some hemoptysis with thick gray sputum.  This seems to be stable.  But she is sitting and watching TV.  She is unable to prone. Had slight fever 101 on September 02, 2020 but currently afebrile although white count is up at 24.6  9/25 - On therapeutic dose Lovenox.  Currently into MICU bed 16.  Huge improvement in D-dimer.  She is beginning to feel better.  However she is BiPAP dependent.  With her BiPAP she is desaturating.  She is feels better.  She wants to eat all the nursing is concerned that she can only tolerate clear liquid diet under supervision because otherwise she would desaturate.  Her procalcitonin was slightly elevated and is improving.  Her urine Streptococcus is negative.  Her fever profile and white count are improving.  She is still on Solu-Medrol.  Blood sugars are adequate according to nursing. Has had some epistaxis and nursing withheld Lovenox.  D-dimer is getting back to her recent baseline.  Duplex negative for DVT.  Afebrile   Consults:  PCCM  Procedures:   Significant Diagnostic Tests:  CTA 9/19 with no PE, near confluent dense groundglass opacities in all lobes of bilateral lungs 09/02/2019 one 2D echo essentially unremarkable  923 lower extremity Doppler studies-> neg 9/27 - repeat duplex  - planned  Micro Data:  MRSA PCR 08/30/2020: Negative QuantiFERON gold 08/30/2020: Indetermindate SARS-CoV-2 08/30/2020: Positive Blood culture 09/03/2020: neg Urine strep  09/04/2020 - neg Urine Legionella 09/04/2020 -  IgG antibody COVID 9/25 - Positive  Antimicrobials:  Remdesivir 9/19 Baricitinib 9/19  (14 days) Solumedrol 9.23 125mg  daily -changed to 80 mg on 09/05/2020 xxx Azithromycin 09/03/2020>> 09/04/2020 Cefepime 09/03/2020 >> [7 days]   Interim history/subjective:   Patient continues to report symptomatic improvement. Continues to have desaturations with minimal exertion.   Objective   Blood pressure (!) 158/65, pulse (!) 112, temperature  98.4 F (36.9 C), temperature source Axillary, resp. rate (!) 28, height 5\' 4"  (1.626 m), weight 108.5 kg, SpO2 95 %.    Vent Mode: PCV;BIPAP FiO2 (%):  [100 %] 100 % Set Rate:  [8 bmp] 8 bmp PEEP:  [8 cmH20] 8 cmH20   Intake/Output Summary (Last 24 hours) at 09/07/2020 1541 Last data filed at 09/07/2020 1221 Gross per 24 hour  Intake 920.05 ml  Output 800 ml  Net 120.05 ml   Filed Weights   09/03/20 1103 09/04/20 1500 09/07/20 0500  Weight: 112.2 kg 106.9 kg 108.5 kg   Physical Exam Constitutional:      General: She is not in acute distress.    Appearance: She is obese. She is not toxic-appearing.     Interventions: She is not intubated. HENT:     Head: Normocephalic and atraumatic.     Mouth/Throat:     Mouth: Mucous membranes are moist.  Cardiovascular:     Rate and Rhythm: Normal rate and regular rhythm.     Heart sounds: Normal heart sounds.  Pulmonary:     Effort: Pulmonary effort is normal. No accessory muscle usage or respiratory distress. She is not intubated.     Breath sounds: Normal breath sounds.  Abdominal:     Palpations: Abdomen is soft.  Musculoskeletal:        General: Normal range of motion.     Right lower leg: No edema.     Left lower leg: No edema.  Skin:    General: Skin is warm and dry.     Capillary Refill: Capillary refill takes 2 to 3 seconds.  Neurological:     General: No focal deficit present.      Resolved Hospital Problem list   n/a  Assessment & Plan:    Critically ill due to acute hypoxic respiratory failure secondary to ARDS in setting of COVID-19 pneumonia requiring heated high flow nasal cannula. -No signs of respiratory distress and acceptable oxygen saturation. -Continue current strategy of nocturnal BiPAP and daytime high flow nasal cannula supplemented with nonrebreather. -Factitiously high oxygen saturations. -Intubate for worsening respiratory distress, but appears to be over the hump   COVID-19 -status post  remdesivir -Complete course of steroids  Fever 09/02/2020  -Continue empiric cefepime since 09/03/10; provisional stop date established for 7 days -but could stop earlier if culture negative   Best practice:  Diet: N.p.o. except meds.  Clear liquid diet as tolerated in the setting of nursing supervision  pain/Anxiety/Delirium protocol (if indicated): None VAP protocol (if indicated): Sit in bed with head elevation DVT prophylaxis: Preventative dose Lovenox  => approximately 09/02/2020  =. treatment dose Lovenox empiric-> changed on 09/05/2020 tp  prophylactic dose Lovenox because of epistaxis and improved D-dimer  GI prophylaxis: PPI Glucose control: ssi Mobility: bed rest,  Code Status: full Family Communication: pateint and later Benay Pike [phone]. - daughter wanting daily updates Disposition: [id number]     LABS    PULMONARY Recent Labs  Lab 09/02/20 1847 09/03/20 1125 09/04/20 0913  PHART  7.464* 7.512* 7.497*  PCO2ART 38.9 35.1 38.9  PO2ART 53.5* 37* 40.3*  HCO3 27.4 28.2* 29.8*  TCO2  --  29  --   O2SAT 86.9 76.0 75.0    CBC Recent Labs  Lab 09/04/20 1206 09/05/20 0246 09/06/20 0729  HGB 11.6* 11.6* 11.2*  HCT 37.6 38.4 37.8  WBC 28.3* 20.2* 22.9*  PLT 419* 457* 504*    COAGULATION No results for input(s): INR in the last 168 hours.  CARDIAC  No results for input(s): TROPONINI in the last 168 hours. No results for input(s): PROBNP in the last 168 hours.   CHEMISTRY Recent Labs  Lab 09/01/20 0146 09/01/20 0146 09/02/20 0851 09/02/20 0851 09/03/20 0722 09/03/20 0722 09/03/20 1125 09/03/20 1125 09/04/20 1206 09/04/20 1206 09/05/20 0246 09/06/20 0729  NA 136   < > 137   < > 132*  --  135  --  136  --  138 138  K 4.3   < > 3.5   < > 3.7   < > 4.0   < > 4.1   < > 4.6 4.6  CL 102   < > 99  --  94*  --   --   --  97*  --  99 102  CO2 23   < > 28  --  23  --   --   --  24  --  25 24  GLUCOSE 243*   < > 157*  --  255*  --   --   --  187*  --  130*  86  BUN 17   < > 13  --  11  --   --   --  20  --  20 21  CREATININE 0.83   < > 0.92  --  0.97  --   --   --  1.04*  --  0.96 1.00  CALCIUM 8.6*   < > 8.8*  --  8.7*  --   --   --  8.7*  --  8.7* 8.9  MG 2.5*  --  2.4  --  2.0  --   --   --  2.3  --  2.6*  --   PHOS 2.6   < > 2.7  --  2.9  --   --   --  4.1  --  5.0* 3.9   < > = values in this interval not displayed.   Estimated Creatinine Clearance: 70.2 mL/min (by C-G formula based on SCr of 1 mg/dL).   LIVER Recent Labs  Lab 09/02/20 0851 09/03/20 0722 09/04/20 1206 09/05/20 0246 09/06/20 0729  AST 51* 43* 43* 40 30  ALT 54* 48* 46* 44 39  ALKPHOS 108 98 90 76 69  BILITOT 0.7 1.5* 0.7 0.9 0.9  PROT 6.6 6.7 6.8 6.5 6.4*  ALBUMIN 2.9* 2.8* 2.8* 2.6* 2.5*     INFECTIOUS Recent Labs  Lab 09/03/20 0722  PROCALCITON 0.18     ENDOCRINE CBG (last 3)  Recent Labs    09/07/20 0831 09/07/20 1157 09/07/20 1508  GLUCAP 73 124* 197*   IMAGING x48h  - image(s) personally visualized  -   highlighted in bold No results found.  CRITICAL CARE Performed by: Kipp Brood   Total critical care time: 35 minutes  Critical care time was exclusive of separately billable procedures and treating other patients.  Critical care was necessary to treat or prevent imminent or life-threatening deterioration.  Critical care was time spent personally  by me on the following activities: development of treatment plan with patient and/or surrogate as well as nursing, discussions with consultants, evaluation of patient's response to treatment, examination of patient, obtaining history from patient or surrogate, ordering and performing treatments and interventions, ordering and review of laboratory studies, ordering and review of radiographic studies, pulse oximetry, re-evaluation of patient's condition and participation in multidisciplinary rounds.  Kipp Brood, MD [facility] ICU Physician [place]  Pager:  [phone] Mobile: [phone] After hours: [phone].

## 2020-09-07 NOTE — Progress Notes (Signed)
Patient refused BIPAP for the night.  

## 2020-09-08 DIAGNOSIS — J80 Acute respiratory distress syndrome: Secondary | ICD-10-CM | POA: Diagnosis not present

## 2020-09-08 DIAGNOSIS — U071 COVID-19: Secondary | ICD-10-CM | POA: Diagnosis not present

## 2020-09-08 LAB — GLUCOSE, CAPILLARY
Glucose-Capillary: 110 mg/dL — ABNORMAL HIGH (ref 70–99)
Glucose-Capillary: 134 mg/dL — ABNORMAL HIGH (ref 70–99)
Glucose-Capillary: 147 mg/dL — ABNORMAL HIGH (ref 70–99)
Glucose-Capillary: 239 mg/dL — ABNORMAL HIGH (ref 70–99)
Glucose-Capillary: 276 mg/dL — ABNORMAL HIGH (ref 70–99)
Glucose-Capillary: 222 mg/dL — ABNORMAL HIGH (ref 70–99)

## 2020-09-08 LAB — CULTURE, BLOOD (SINGLE)
Culture: NO GROWTH
Special Requests: ADEQUATE

## 2020-09-08 LAB — PHOSPHORUS: Phosphorus: 3.6 mg/dL (ref 2.5–4.6)

## 2020-09-08 MED ORDER — PREDNISONE 20 MG PO TABS: 40.0000 mg | ORAL_TABLET | Freq: Every day | ORAL | Status: DC

## 2020-09-08 MED ORDER — INSULIN ASPART 100 UNIT/ML ~~LOC~~ SOLN
0.0000 [IU] | SUBCUTANEOUS | Status: DC
  Administered 2020-09-08: 5 [IU] via SUBCUTANEOUS
  Administered 2020-09-09: 3 [IU] via SUBCUTANEOUS
  Administered 2020-09-09: 5 [IU] via SUBCUTANEOUS
  Administered 2020-09-09: 8 [IU] via SUBCUTANEOUS
  Administered 2020-09-09: 3 [IU] via SUBCUTANEOUS
  Administered 2020-09-10 (×3): 5 [IU] via SUBCUTANEOUS
  Administered 2020-09-10: 2 [IU] via SUBCUTANEOUS
  Administered 2020-09-10 – 2020-09-11 (×2): 5 [IU] via SUBCUTANEOUS
  Administered 2020-09-11: 2 [IU] via SUBCUTANEOUS
  Administered 2020-09-11: 3 [IU] via SUBCUTANEOUS
  Administered 2020-09-11 – 2020-09-12 (×2): 2 [IU] via SUBCUTANEOUS
  Administered 2020-09-12: 5 [IU] via SUBCUTANEOUS
  Administered 2020-09-12 – 2020-09-13 (×2): 2 [IU] via SUBCUTANEOUS
  Administered 2020-09-13 (×2): 3 [IU] via SUBCUTANEOUS
  Administered 2020-09-14: 5 [IU] via SUBCUTANEOUS
  Administered 2020-09-14: 8 [IU] via SUBCUTANEOUS
  Administered 2020-09-14 – 2020-09-15 (×2): 3 [IU] via SUBCUTANEOUS
  Administered 2020-09-15: 8 [IU] via SUBCUTANEOUS
  Administered 2020-09-15: 5 [IU] via SUBCUTANEOUS
  Administered 2020-09-16 (×2): 2 [IU] via SUBCUTANEOUS
  Administered 2020-09-16: 3 [IU] via SUBCUTANEOUS
  Administered 2020-09-16: 5 [IU] via SUBCUTANEOUS
  Administered 2020-09-17: 2 [IU] via SUBCUTANEOUS

## 2020-09-08 NOTE — Progress Notes (Signed)
Initial Nutrition Assessment  DOCUMENTATION CODES:   Obesity unspecified  INTERVENTION:   Recommend Cortrak placement tomorrow given poor PO intake x 9 days.   Liberalize diet to REGULAR   Ensure Enlive po BID, each supplement provides 350 kcal and 20 grams of protein  Magic cup TID with meals, each supplement provides 290 kcal and 9 grams of protein  MVI daily   If Cortrak placed:  Vital 1.5 @ 50 ml/hr (1200 ml) 45 ml ProSource TID  Provides: 1920 kcals, 114 grams protein, 917 ml free water.   NUTRITION DIAGNOSIS:   Increased nutrient needs related to acute illness (COVID PNA) as evidenced by estimated needs.  GOAL:   Patient will meet greater than or equal to 90% of their needs  MONITOR:   PO intake, Supplement acceptance, Weight trends, Labs, I & O's  REASON FOR ASSESSMENT:   Rounds    ASSESSMENT:   Patient with PMH significant for bipolar disorder and GERD. Presents this admission with COVID PNA.   Pt discussed during ICU rounds and with RN.   Unable to obtain history from pt at this time. Completed remdesivir. Continues on steroids. Remains on 40 L HFNC.   On bariatric clear diet since 9/25? Advanced to carb modified this am. Last eight meals completions charted as 0-40% with the last 3 meals documented as 0%. RD observed three unopened bottles of Ensure at bedside.   Recommend Cortrak placement given prolonged poor PO intake and increased nutrition need with COVID PNA. Per MD, give her today to start eating and if not okay to place Cortrak.   Weight history limited over the last year. Utilize 102.5 kg as EDW for now.   Medications: SS novolog, levemir, solumedrol, miralax, prednisone Labs: Na 134 (L) CBG 147-236  Diet Order:   Diet Order            Diet Carb Modified Fluid consistency: Thin; Room service appropriate? Yes  Diet effective now                 EDUCATION NEEDS:   Not appropriate for education at this time  Skin:  Skin  Assessment: Reviewed RN Assessment  Last BM:  9/25  Height:   Ht Readings from Last 1 Encounters:  08/29/20 5\' 4"  (1.626 m)    Weight:   Wt Readings from Last 1 Encounters:  09/07/20 108.5 kg    BMI:  Body mass index is 41.06 kg/m.   Adjusted BW: 73.7 kg  Estimated Nutritional Needs:   Kcal:  9675-9163 kcal  Protein:  109-136 g  Fluid:  >/= 2 L/day   Mariana Single RD, LDN Clinical Nutrition Pager listed in [place]

## 2020-09-08 NOTE — Progress Notes (Signed)
Patient transferred to 5W room 16, report given to the nurse.

## 2020-09-08 NOTE — Progress Notes (Signed)
RT called to patient's room due to difficulty with heated high flow and getting an O2 sat.  Upon arrival,HHFNC is connected correctly to the patient and a NRB mask at 15L.The pulse ox is not giving any wave form.  RT asked for a new pulse ox. A portable pulse ox monitor was attached and the patient's sats are 96%.  RT will continue to monitor as needed.

## 2020-09-08 NOTE — Progress Notes (Signed)
eLink Physician-Brief Progress Note Patient Name: Wendy Ruiz DOB: 1956/08/14 MRN: [id number]   Date of Service  09/08/2020  HPI/Events of Note  Hyperglycemia - Blood glucose = 276. Patient is currently on a Q 4 hour standard Novolog SSI.  eICU Interventions  Plan: 1. Change to Q 4 hour moderate Novolog SSI.      Intervention Category Major Interventions: Hyperglycemia - active titration of insulin therapy  Lysle Dingwall 09/08/2020, 9:49 PM

## 2020-09-08 NOTE — Plan of Care (Signed)
  Problem: Education: Goal: Knowledge of risk factors and measures for prevention of condition will improve Outcome: Progressing   Problem: Respiratory: Goal: Will maintain a patent airway Outcome: Progressing   Problem: Education: Goal: Knowledge of General Education information will improve Description: Including pain rating scale, medication(s)/side effects and non-pharmacologic comfort measures Outcome: Progressing   Problem: Health Behavior/Discharge Planning: Goal: Ability to manage health-related needs will improve Outcome: Progressing   Problem: Clinical Measurements: Goal: Respiratory complications will improve Outcome: Progressing   Problem: Activity: Goal: Risk for activity intolerance will decrease Outcome: Progressing   Problem: Nutrition: Goal: Adequate nutrition will be maintained Outcome: Progressing   Problem: Clinical Measurements: Goal: Ability to maintain clinical measurements within normal limits will improve Outcome: Progressing Goal: Will remain free from infection Outcome: Progressing Goal: Diagnostic test results will improve Outcome: Progressing Goal: Cardiovascular complication will be avoided Outcome: Progressing   Problem: Coping: Goal: Level of anxiety will decrease Outcome: Progressing   Problem: Elimination: Goal: Will not experience complications related to bowel motility Outcome: Progressing Goal: Will not experience complications related to urinary retention Outcome: Progressing   Problem: Pain Managment: Goal: General experience of comfort will improve Outcome: Progressing   Problem: Safety: Goal: Ability to remain free from injury will improve Outcome: Progressing   Problem: Skin Integrity: Goal: Risk for impaired skin integrity will decrease Outcome: Progressing

## 2020-09-08 NOTE — Progress Notes (Signed)
NAME:  Wendy Ruiz, MRN:  [id number], DOB:  06-03-1956, LOS: 9 ADMISSION DATE:  08/29/2020, CONSULTATION DATE: 09/08/20 REFERRING MD:  Kipp Brood, MD CHIEF COMPLAINT: Shortness of breath  Brief History    Patient noted feeling ill a week to week and a half ago.  Malaise, fever.  Significant fatigue.  Noted some labored breathing particularly when lying flat.  Also more labored when walking around the house.  Children have been coming to the house to check on her as she is not been feeling well.  She went to sleep last night and awoke to EMS in her room.  Sounds like she had episode of syncope and did not hear his children banging on the door.  Therefore rescue squad was called.  She was transported to the ED.  Chest x-ray with bilateral infiltrates.  Severely hypoxemic requiring nonrebreather.  Desaturation with movement when lying flat.  CTA performed which showed near confluent dense groundglass opacities throughout all lung fields.  No PE.  Oxygenation and respiratory rate seems to improved with the addition of high flow nasal cannula to nonrebreather.  She feels more comfortable sitting upright in the chair as opposed to laying in stretcher.  Did try to prone for a little bit but felt her respiratory distress worsened and her oxygen saturation worsened per RN.  Past Medical History  GERD  has a past medical history of Bipolar 1 disorder ([place]), Chronic pain due to trauma, and Obesity.   has no past surgical history on file.   Significant Hospital Events   9/19 admitted 09/02/2020: Switch to full dose Lovenox empiric treatment because of increasing D-dimer 09/03/2020: CCM primary from family practice teaching service because of concern of decompensation.  Remains in 5 W.  9/24 - he says she is better but nursing notes indicate that she might not be better.  She is on 55 L heated high flow nasal cannula at 100% along with nonrebreather.  Pulse ox is in the 80s.  2 days ago it was in  the 90s.  She is having some hemoptysis with thick gray sputum.  This seems to be stable.  But she is sitting and watching TV.  She is unable to prone. Had slight fever 101 on September 02, 2020 but currently afebrile although white count is up at 24.6  9/25 - On therapeutic dose Lovenox.  Currently into MICU bed 16.  Huge improvement in D-dimer.  She is beginning to feel better.  However she is BiPAP dependent.  With her BiPAP she is desaturating.  She is feels better.  She wants to eat all the nursing is concerned that she can only tolerate clear liquid diet under supervision because otherwise she would desaturate.  Her procalcitonin was slightly elevated and is improving.  Her urine Streptococcus is negative.  Her fever profile and white count are improving.  She is still on Solu-Medrol.  Blood sugars are adequate according to nursing. Has had some epistaxis and nursing withheld Lovenox.  D-dimer is getting back to her recent baseline.  Duplex negative for DVT.  Afebrile   Consults:  PCCM  Procedures:   Significant Diagnostic Tests:  CTA 9/19 with no PE, near confluent dense groundglass opacities in all lobes of bilateral lungs 09/02/2019 one 2D echo essentially unremarkable  923 lower extremity Doppler studies-> neg 9/27 - repeat duplex  - planned  Micro Data:  MRSA PCR 08/30/2020: Negative QuantiFERON gold 08/30/2020: Indetermindate SARS-CoV-2 08/30/2020: Positive Blood culture 09/03/2020: neg Urine strep  09/04/2020 - neg Urine Legionella 09/04/2020 -  IgG antibody COVID 9/25 - Positive  Antimicrobials:  Remdesivir 9/19 Baricitinib 9/19  (14 days) Solumedrol 9.23 125mg  daily -changed to 80 mg on 09/05/2020 xxx Azithromycin 09/03/2020>> 09/04/2020 Cefepime 09/03/2020 >> [7 days]   Interim history/subjective:   Patient continues to report symptomatic improvement. Continues to have desaturations with minimal exertion.   Objective   Blood pressure 116/67, pulse (!) 104, temperature 98.6  F (37 C), temperature source Axillary, resp. rate (!) 25, height 5\' 4"  (1.626 m), weight 108.5 kg, SpO2 96 %.    FiO2 (%):  [100 %] 100 %   Intake/Output Summary (Last 24 hours) at 09/08/2020 1743 Last data filed at 09/08/2020 0920 Gross per 24 hour  Intake 434.77 ml  Output 920 ml  Net -485.23 ml   Filed Weights   09/03/20 1103 09/04/20 1500 09/07/20 0500  Weight: 112.2 kg 106.9 kg 108.5 kg   Physical Exam Constitutional:      General: She is not in acute distress.    Appearance: She is obese. She is not toxic-appearing.     Interventions: She is not intubated. HENT:     Head: Normocephalic and atraumatic.     Mouth/Throat:     Mouth: Mucous membranes are moist.  Cardiovascular:     Rate and Rhythm: Normal rate and regular rhythm.     Heart sounds: Normal heart sounds.  Pulmonary:     Effort: Pulmonary effort is normal. No accessory muscle usage or respiratory distress. She is not intubated.     Breath sounds: Normal breath sounds.  Abdominal:     Palpations: Abdomen is soft.  Musculoskeletal:        General: Normal range of motion.     Right lower leg: No edema.     Left lower leg: No edema.  Skin:    General: Skin is warm and dry.     Capillary Refill: Capillary refill takes 2 to 3 seconds.  Neurological:     General: No focal deficit present.      Resolved Hospital Problem list   n/a  Assessment & Plan:    Critically ill due to acute hypoxic respiratory failure secondary to ARDS in setting of COVID-19 pneumonia requiring heated high flow nasal cannula. -No signs of respiratory distress and acceptable oxygen saturation. - Did not desaturate excessively off BiPAP and is saturating well on combination high flow nasal cannula and nonrebreather. Likely does not need the latter. -As she has showed no deterioration over the past 4 days she can be transferred for management back on stepdown.  COVID-19 -status post remdesivir -Complete course of steroids  Fever  09/02/2020  - stopped cefepime as cultures negative.    Best practice:  Diet: N.p.o. except meds. Advance diet.  pain/Anxiety/Delirium protocol (if indicated): None VAP protocol (if indicated): Sit in bed with head elevation DVT prophylaxis: Preventative dose Lovenox  => approximately 09/02/2020  =. treatment dose Lovenox empiric-> changed on 09/05/2020 tp  prophylactic dose Lovenox because of epistaxis and improved D-dimer  GI prophylaxis: PPI Glucose control: ssi Mobility: bed rest,  Code Status: full Family Communication: pateint and later Benay Pike [phone]. - daughter wanting daily updates - no answer when called.  Disposition: transfer to 5w - orders reconciled and [facility] notified.      LABS    PULMONARY Recent Labs  Lab 09/02/20 1847 09/03/20 1125 09/04/20 0913  PHART 7.464* 7.512* 7.497*  PCO2ART 38.9 35.1 38.9  PO2ART 53.5*  37* 40.3*  HCO3 27.4 28.2* 29.8*  TCO2  --  29  --   O2SAT 86.9 76.0 75.0    CBC Recent Labs  Lab 09/04/20 1206 09/05/20 0246 09/06/20 0729  HGB 11.6* 11.6* 11.2*  HCT 37.6 38.4 37.8  WBC 28.3* 20.2* 22.9*  PLT 419* 457* 504*    COAGULATION No results for input(s): INR in the last 168 hours.  CARDIAC  No results for input(s): TROPONINI in the last 168 hours. No results for input(s): PROBNP in the last 168 hours.   CHEMISTRY Recent Labs  Lab 09/02/20 0851 09/02/20 0851 09/03/20 0722 09/03/20 0722 09/03/20 1125 09/04/20 1206 09/04/20 1206 09/05/20 0246 09/05/20 0246 09/06/20 0729 09/07/20 1758 09/08/20 0124  NA 137   < > 132*   < > 135 136  --  138  --  138 134*  --   K 3.5   < > 3.7   < > 4.0 4.1   < > 4.6   < > 4.6 4.9  --   CL 99   < > 94*  --   --  97*  --  99  --  102 100  --   CO2 28   < > 23  --   --  24  --  25  --  24 23  --   GLUCOSE 157*   < > 255*  --   --  187*  --  130*  --  86 227*  --   BUN 13   < > 11  --   --  20  --  20  --  21 15  --   CREATININE 0.92   < > 0.97  --   --  1.04*  --  0.96  --   1.00 0.94  --   CALCIUM 8.8*   < > 8.7*  --   --  8.7*  --  8.7*  --  8.9 8.7*  --   MG 2.4  --  2.0  --   --  2.3  --  2.6*  --   --  2.4  --   PHOS 2.7   < > 2.9   < >  --  4.1  --  5.0*  --  3.9 3.6 3.6   < > = values in this interval not displayed.   Estimated Creatinine Clearance: 74.6 mL/min (by C-G formula based on SCr of 0.94 mg/dL).   LIVER Recent Labs  Lab 09/03/20 0722 09/04/20 1206 09/05/20 0246 09/06/20 0729 09/07/20 1758  AST 43* 43* 40 30 43*  ALT 48* 46* 44 39 49*  ALKPHOS 98 90 76 69 78  BILITOT 1.5* 0.7 0.9 0.9 0.7  PROT 6.7 6.8 6.5 6.4* 6.5  ALBUMIN 2.8* 2.8* 2.6* 2.5* 2.3*     INFECTIOUS Recent Labs  Lab 09/03/20 0722  PROCALCITON 0.18     ENDOCRINE CBG (last 3)  Recent Labs    09/08/20 0821 09/08/20 1144 09/08/20 1644  GLUCAP 134* 147* 239*   IMAGING x48h  - image(s) personally visualized  -   highlighted in bold No results found.   Kipp Brood, MD [facility] ICU Physician [place]  Pager: [phone] Mobile: [phone] After hours: [phone].

## 2020-09-09 ENCOUNTER — Inpatient Hospital Stay (HOSPITAL_COMMUNITY): Payer: HRSA Program

## 2020-09-09 DIAGNOSIS — U071 COVID-19: Secondary | ICD-10-CM | POA: Diagnosis not present

## 2020-09-09 DIAGNOSIS — J9601 Acute respiratory failure with hypoxia: Secondary | ICD-10-CM | POA: Diagnosis not present

## 2020-09-09 DIAGNOSIS — J80 Acute respiratory distress syndrome: Secondary | ICD-10-CM | POA: Diagnosis not present

## 2020-09-09 LAB — BASIC METABOLIC PANEL
Anion gap: 13 (ref 5–15)
BUN: 17 mg/dL (ref 8–23)
CO2: 24 mmol/L (ref 22–32)
Calcium: 9 mg/dL (ref 8.9–10.3)
Chloride: 100 mmol/L (ref 98–111)
Creatinine, Ser: 0.88 mg/dL (ref 0.44–1.00)
GFR calc Af Amer: >60 mL/min (ref >60)
GFR calc non Af Amer: >60 mL/min (ref >60)
Glucose, Bld: 145 mg/dL — ABNORMAL HIGH (ref 70–99)
Potassium: 4 mmol/L (ref 3.5–5.1)
Sodium: 137 mmol/L (ref 135–145)

## 2020-09-09 LAB — C-REACTIVE PROTEIN: CRP: 8 mg/dL — ABNORMAL HIGH (ref <1.0)

## 2020-09-09 LAB — GLUCOSE, CAPILLARY
Glucose-Capillary: 100 mg/dL — ABNORMAL HIGH (ref 70–99)
Glucose-Capillary: 151 mg/dL — ABNORMAL HIGH (ref 70–99)
Glucose-Capillary: 189 mg/dL — ABNORMAL HIGH (ref 70–99)
Glucose-Capillary: 205 mg/dL — ABNORMAL HIGH (ref 70–99)
Glucose-Capillary: 255 mg/dL — ABNORMAL HIGH (ref 70–99)
Glucose-Capillary: 75 mg/dL (ref 70–99)

## 2020-09-09 LAB — CBC
HCT: 35.6 % — ABNORMAL LOW (ref 36.0–46.0)
Hemoglobin: 10.9 g/dL — ABNORMAL LOW (ref 12.0–15.0)
MCH: 26.6 pg (ref 26.0–34.0)
MCHC: 30.6 g/dL (ref 30.0–36.0)
MCV: 86.8 fL (ref 80.0–100.0)
Platelets: 497 10*3/uL — ABNORMAL HIGH (ref 150–400)
RBC: 4.1 MIL/uL (ref 3.87–5.11)
RDW: 12.7 % (ref 11.5–15.5)
WBC: 27.1 10*3/uL — ABNORMAL HIGH (ref 4.0–10.5)
nRBC: 0.1 % (ref 0.0–0.2)

## 2020-09-09 LAB — BRAIN NATRIURETIC PEPTIDE: B Natriuretic Peptide: 34 pg/mL (ref 0.0–100.0)

## 2020-09-09 LAB — TSH: TSH: 1.705 u[IU]/mL (ref 0.350–4.500)

## 2020-09-09 LAB — D-DIMER, QUANTITATIVE: D-Dimer, Quant: 14.76 ug/mL-FEU — ABNORMAL HIGH (ref 0.00–0.50)

## 2020-09-09 LAB — PROCALCITONIN: Procalcitonin: <0.1 ng/mL

## 2020-09-09 LAB — MAGNESIUM: Magnesium: 2.2 mg/dL (ref 1.7–2.4)

## 2020-09-09 MED ORDER — METOPROLOL TARTRATE 25 MG PO TABS
25.0000 mg | ORAL_TABLET | Freq: Two times a day (BID) | ORAL | Status: DC
  Administered 2020-09-09 – 2020-09-10 (×2): 25 mg via ORAL
  Filled 2020-09-09 (×3): qty 1

## 2020-09-09 MED ORDER — METHYLPREDNISOLONE SODIUM SUCC 125 MG IJ SOLR
80.0000 mg | Freq: Every day | INTRAMUSCULAR | Status: DC
  Administered 2020-09-10: 80 mg via INTRAVENOUS
  Filled 2020-09-09: qty 2

## 2020-09-09 MED ORDER — FUROSEMIDE 10 MG/ML IJ SOLN
40.0000 mg | Freq: Once | INTRAMUSCULAR | Status: AC
  Administered 2020-09-09: 40 mg via INTRAVENOUS
  Filled 2020-09-09: qty 4

## 2020-09-09 MED ORDER — LEVOFLOXACIN 750 MG PO TABS
750.0000 mg | ORAL_TABLET | Freq: Every day | ORAL | Status: AC
  Administered 2020-09-09 – 2020-09-11 (×3): 750 mg via ORAL
  Filled 2020-09-09 (×3): qty 1

## 2020-09-09 MED ORDER — ENOXAPARIN SODIUM 120 MG/0.8ML ~~LOC~~ SOLN
110.0000 mg | Freq: Two times a day (BID) | SUBCUTANEOUS | Status: DC
  Administered 2020-09-09 – 2020-09-16 (×13): 110 mg via SUBCUTANEOUS
  Filled 2020-09-09 (×19): qty 0.74

## 2020-09-09 NOTE — Progress Notes (Signed)
[name] for lovenox Indication: r/o VTE  Labs: Recent Labs    09/07/20 1758 09/09/20 0906  HGB  --  10.9*  HCT  --  35.6*  PLT  --  497*  CREATININE 0.94 0.88    Assessment: 23 yof presenting COVID-19 positive. Pharmacy consulted for treatment dose Lovenox for r/o VTE until d-dimer drops <2 per MD. D-dimer trended up 3.46>>14.76 today. Patient previously on Lovenox 0.5mg /kg q24h - last dose 9/28 at 1752. CBC stable, SCr stable wnl. No active bleed issues reported. Patient is not on anticoagulation PTA.  Goal of Therapy:  Anti-Xa level 0.6-1 units/ml 4hrs after LMWH dose given Monitor platelets by anticoagulation protocol: Yes   Plan:  Increase Lovenox to 110mg  (1mg /kg) [place] q24h Monitor CBC, s/sx bleeding F/u VTE r/o, d-dimer trend   Elicia Lamp, PharmD, BCPS Clinical Pharmacist 09/09/2020 1:49 PM

## 2020-09-09 NOTE — Progress Notes (Signed)
°   09/09/20 0728  Assess: MEWS Score  Temp 98.5 F (36.9 C)  BP 108/65  Pulse Rate (!) 111  ECG Heart Rate (!) 110  Resp (!) 26  Level of Consciousness Alert  SpO2 90 %  O2 Device HFNC;Non-rebreather Mask  Patient Activity (if Appropriate) In bed  Heater temperature 93.2 F (34 C)  O2 Flow Rate (L/min) 35 L/min  FiO2 (%) 90 %  Assess: MEWS Score  MEWS Temp 0  MEWS Systolic 0  MEWS Pulse 1  MEWS RR 2  MEWS LOC 0  MEWS Score 3  MEWS Score Color Yellow  Assess: if the MEWS score is Yellow or Red  Were vital signs taken at a resting state? Yes  Focused Assessment Change from prior assessment (see assessment flowsheet)  Early Detection of Sepsis Score *See Row Information* Low  MEWS guidelines implemented *See Row Information* Yes  Treat  MEWS Interventions Escalated (See documentation below)  Pain Scale 0-10  Pain Score 0  Breathing 0  Take Vital Signs  Increase Vital Sign Frequency  Yellow: Q 2hr X 2 then Q 4hr X 2, if remains yellow, continue Q 4hrs  Escalate  MEWS: Escalate Yellow: discuss with charge nurse/RN and consider discussing with provider and RRT  Notify: Charge Nurse/RN  Name of Charge Nurse/RN Notified Primitivo Gauze RN  Date Charge Nurse/RN Notified 09/09/20  Time Charge Nurse/RN Notified 0805  Notify: Provider  Provider Name/Title Dr. Candiss Norse  Date Provider Notified 09/06/20  Time Provider Notified [phone]  Notification Type Page  Response No new orders  Document  Patient Outcome Not stable and remains on department  Progress note created (see row info) Yes

## 2020-09-09 NOTE — Plan of Care (Signed)
  Problem: Education: Goal: Knowledge of risk factors and measures for prevention of condition will improve Outcome: Progressing   Problem: Respiratory: Goal: Will maintain a patent airway Outcome: Progressing   Problem: Education: Goal: Knowledge of General Education information will improve Description: Including pain rating scale, medication(s)/side effects and non-pharmacologic comfort measures Outcome: Progressing   Problem: Health Behavior/Discharge Planning: Goal: Ability to manage health-related needs will improve Outcome: Progressing   Problem: Clinical Measurements: Goal: Respiratory complications will improve Outcome: Progressing   Problem: Activity: Goal: Risk for activity intolerance will decrease Outcome: Progressing   Problem: Nutrition: Goal: Adequate nutrition will be maintained Outcome: Progressing   Problem: Clinical Measurements: Goal: Ability to maintain clinical measurements within normal limits will improve Outcome: Progressing Goal: Will remain free from infection Outcome: Progressing Goal: Diagnostic test results will improve Outcome: Progressing Goal: Cardiovascular complication will be avoided Outcome: Progressing   Problem: Coping: Goal: Level of anxiety will decrease Outcome: Progressing   Problem: Elimination: Goal: Will not experience complications related to bowel motility Outcome: Progressing Goal: Will not experience complications related to urinary retention Outcome: Progressing   Problem: Pain Managment: Goal: General experience of comfort will improve Outcome: Progressing   Problem: Safety: Goal: Ability to remain free from injury will improve Outcome: Progressing   Problem: Skin Integrity: Goal: Risk for impaired skin integrity will decrease Outcome: Progressing

## 2020-09-09 NOTE — Progress Notes (Signed)
PROGRESS NOTE                                                                                                                                                                                                             Patient Demographics:    Wendy Ruiz, is a 64 y.o. female, DOB - [birth year], AOZ:[id number]  Outpatient Primary MD for the patient is Lyndee Hensen, DO    LOS - 10  Admit date - 08/29/2020    Chief Complaint  Patient presents with  . Shortness of Breath       Brief Narrative  - 64 year old African-American female with past medical history of bipolar disorder, obesity, GERD who was admitted to the hospital on 08/29/2020 with severe acute hypoxic respiratory failure due to COVID-19 pneumonia, unfortunately patient is not vaccinated.  She was under the care of ICU and was BiPAP dependent for a while.  She has been transferred to hospitalist service on 09/09/2020.  Currently on 50 L of heated high flow at 100% FiO2 along with nonrebreather mask.   Subjective:    [name] today has, No headache, No chest pain, No abdominal pain - No Nausea, No new weakness tingling or numbness, +ve cough and SOB.   Assessment  & Plan :     1. Acute Hypoxic Resp. Failure due to Acute Covid 19 Viral Pneumonitis during the ongoing 2020 Covid 19 Pandemic - she is unfortunately not vaccinated and has incurred severe parenchymal lung injury, she is being treated with combination of remdesivir, IV steroids and Baricitinib.  Was under the care of ICU and transferred to hospitalist service on 09/09/2020, she is currently on 50 L heated high flow and 100% FiO2 plus nonrebreather.  If any worse we will move her to ICU for BiPAP or intubation.  Extremely guarded.  Encouraged the patient to sit up in chair in the daytime use I-S and flutter valve for pulmonary toiletry and then prone in bed when at night.  Will advance activity and  titrate down oxygen as possible.     SpO2: 92 % O2 Flow Rate (L/min): 50 L/min FiO2 (%): 100 %  Recent Labs  Lab 09/03/20 0722 09/04/20 1206 09/05/20 0246 09/05/20 0643 09/06/20 0729 09/07/20 1758 09/09/20 0906  WBC 24.6* 28.3* 20.2*  --  22.9*  --  27.1*  PLT 338  419* 457*  --  504*  --  497*  CRP 8.7* 13.9*  --   --   --   --  8.0*  BNP  --   --   --   --   --   --  34.0  DDIMER >20.00*  --   --  5.32* 3.46*  --  14.76*  PROCALCITON 0.18  --   --   --   --   --  <0.10  AST 43* 43* 40  --  30 43*  --   ALT 48* 46* 44  --  39 49*  --   ALKPHOS 98 90 76  --  69 78  --   BILITOT 1.5* 0.7 0.9  --  0.9 0.7  --   ALBUMIN 2.8* 2.8* 2.6*  --  2.5* 2.3*  --      2.  Extremely elevated D-dimer.  Full dose Lovenox.  Initial CTA and lower extremity leg ultrasound unremarkable but this was around the time of admission, echo shows increased RV systolic pressure, continue full dose Lovenox, repeat lower extremity venous duplex, once clinically stable repeat CTA.  For now continue full dose anticoagulation.   3.  Intermediate quanteferon checked in ICU.  Outpatient monitoring.  CTA does not show any signs of active tuberculosis.  4.  GERD.  PPI.    Condition - Extremely Guarded  Family Communication  :   Daughter [name] [phone] on 09/09/2020 message left at 1:55 PM.  Daughter [name] [phone] on 09/09/20 updated    Code Status : Full  Consults  :  PCCM  Procedures  :    CTA 9/19 with no PE, near confluent dense groundglass opacities in all lobes of bilateral lungs 09/02/2019 one 2D echo essentially unremarkable  923 lower extremity Doppler studies-> neg 9/27 - repeat duplex  - pending   PUD Prophylaxis : PPI  Disposition Plan  :    Status is: Inpatient  Remains inpatient appropriate because:IV treatments appropriate due to intensity of illness or inability to take PO   Dispo: The patient is from: Home              Anticipated d/c is to: Home               Anticipated d/c date is: > 3 days              Patient currently is not medically stable to d/c.   DVT Prophylaxis  :  Lovenox   Lab Results  Component Value Date   PLT 497 (H) 09/09/2020    Diet :  Diet Order            Diet Carb Modified Fluid consistency: Thin; Room service appropriate? Yes  Diet effective now                  Inpatient Medications  Scheduled Meds: . baricitinib  4 mg Oral Daily  . Chlorhexidine Gluconate Cloth  6 each Topical Daily  . feeding supplement (ENSURE ENLIVE)  237 mL Oral TID  . insulin aspart  0-15 Units Subcutaneous Q4H  . insulin detemir  12 Units Subcutaneous BID  . levofloxacin  750 mg Oral Daily  . mouth rinse  15 mL Mouth Rinse BID  . [START ON 09/10/2020] methylPREDNISolone (SOLU-MEDROL) injection  80 mg Intravenous Daily  . metoprolol tartrate  25 mg Oral BID  . pantoprazole  40 mg Oral Q1200  . polyethylene glycol  17 g Oral  BID   Continuous Infusions: . sodium chloride 10 mL/hr at 09/07/20 2000   PRN Meds:.sodium chloride, acetaminophen, guaiFENesin-dextromethorphan, lip balm, naphazoline-glycerin, phenol, sodium chloride  Antibiotics  :    Anti-infectives (From admission, onward)   Start     Dose/Rate Route Frequency Ordered Stop   09/09/20 1415  levofloxacin (LEVAQUIN) tablet 750 mg        750 mg Oral Daily 09/09/20 1351 09/12/20 0959   09/03/20 1115  ceFEPIme (MAXIPIME) 2 g in sodium chloride 0.9 % 100 mL IVPB  Status:  Discontinued        2 g 200 mL/hr over 30 Minutes Intravenous Every 8 hours 09/03/20 1104 09/08/20 1140   09/03/20 1100  azithromycin (ZITHROMAX) 500 mg in sodium chloride 0.9 % 250 mL IVPB  Status:  Discontinued        500 mg 250 mL/hr over 60 Minutes Intravenous Every 24 hours 09/03/20 1052 09/04/20 0854   08/31/20 1000  remdesivir 100 mg in sodium chloride 0.9 % 100 mL IVPB       "Followed by" Linked Group Details   100 mg 200 mL/hr over 30 Minutes Intravenous Daily 08/30/20 0306 09/03/20 1000    08/30/20 0400  remdesivir 200 mg in sodium chloride 0.9% 250 mL IVPB       "Followed by" Linked Group Details   200 mg 580 mL/hr over 30 Minutes Intravenous Once 08/30/20 0306 08/30/20 0647       Time Spent in minutes  30   Lala Lund M.D on 09/09/2020 at 1:53 PM  To page go to [email or web address] - password [facility]  [facility] -  Office  [phone]     See all Orders from today for further details    Objective:   Vitals:   09/09/20 1130 09/09/20 1152 09/09/20 1230 09/09/20 1330  BP: 118/62 119/69 104/65 111/70  Pulse: (!) 118 (!) 114 (!) 112 (!) 105  Resp: (!) 32 (!) 39 (!) 31 (!) 34  Temp: 98.2 F (36.8 C) (!) 100.4 F (38 C) 98.7 F (37.1 C) 99.1 F (37.3 C)  TempSrc: Axillary Axillary Axillary Axillary  SpO2: (!) 88% 91% 91% 92%  Weight:      Height:        Wt Readings from Last 3 Encounters:  09/07/20 108.5 kg  05/14/13 102.5 kg  07/15/12 95.3 kg     Intake/Output Summary (Last 24 hours) at 09/09/2020 1353 Last data filed at 09/09/2020 1330 Gross per 24 hour  Intake 940 ml  Output 1900 ml  Net -960 ml     Physical Exam  Awake Alert, No new F.N deficits, Normal affect [place].AT,PERRAL Supple Neck,No JVD, No cervical lymphadenopathy appriciated.  Symmetrical Chest wall movement, Good air movement bilaterally, few rales RRR,No Gallops,Rubs or new Murmurs, No Parasternal Heave +ve B.Sounds, Abd Soft, No tenderness, No organomegaly appriciated, No rebound - guarding or rigidity. No Cyanosis, Clubbing or edema, No new Rash or bruise     Data Review:    CBC Recent Labs  Lab 09/03/20 0722 09/03/20 0722 09/03/20 1125 09/04/20 1206 09/05/20 0246 09/06/20 0729 09/09/20 0906  WBC 24.6*  --   --  28.3* 20.2* 22.9* 27.1*  HGB 12.0   < > 12.6 11.6* 11.6* 11.2* 10.9*  HCT 38.7   < > 37.0 37.6 38.4 37.8 35.6*  PLT 338  --   --  419* 457* 504* 497*  MCV 84.1  --   --  84.7 85.7 87.5 86.8  MCH 26.1  --   --  26.1 25.9* 25.9* 26.6  MCHC 31.0  --    --  30.9 30.2 29.6* 30.6  RDW 12.2  --   --  12.5 12.9 12.6 12.7  LYMPHSABS 1.2  --   --  1.1  --   --   --   MONOABS 1.0  --   --  0.8  --   --   --   EOSABS 0.0  --   --  0.0  --   --   --   BASOSABS 0.0  --   --  0.0  --   --   --    < > = values in this interval not displayed.    Recent Labs  Lab 09/03/20 0722 09/03/20 1125 09/04/20 1206 09/05/20 0246 09/05/20 0643 09/06/20 0729 09/07/20 1758 09/09/20 0906  NA 132*   < > 136 138  --  138 134* 137  K 3.7   < > 4.1 4.6  --  4.6 4.9 4.0  CL 94*  --  97* 99  --  102 100 100  CO2 23  --  24 25  --  24 23 24   GLUCOSE 255*  --  187* 130*  --  86 227* 145*  BUN 11  --  20 20  --  21 15 17   CREATININE 0.97  --  1.04* 0.96  --  1.00 0.94 0.88  CALCIUM 8.7*  --  8.7* 8.7*  --  8.9 8.7* 9.0  AST 43*  --  43* 40  --  30 43*  --   ALT 48*  --  46* 44  --  39 49*  --   ALKPHOS 98  --  90 76  --  69 78  --   BILITOT 1.5*  --  0.7 0.9  --  0.9 0.7  --   ALBUMIN 2.8*  --  2.8* 2.6*  --  2.5* 2.3*  --   MG 2.0  --  2.3 2.6*  --   --  2.4 2.2  CRP 8.7*  --  13.9*  --   --   --   --  8.0*  DDIMER >20.00*  --   --   --  5.32* 3.46*  --  14.76*  PROCALCITON 0.18  --   --   --   --   --   --  <0.10  TSH  --   --   --   --   --   --   --  1.705  BNP  --   --   --   --   --   --   --  34.0   < > = values in this interval not displayed.    ------------------------------------------------------------------------------------------------------------------ No results for input(s): CHOL, HDL, LDLCALC, TRIG, CHOLHDL, LDLDIRECT in the last 72 hours.  Lab Results  Component Value Date   HGBA1C 5.7 (H) 08/31/2020   ------------------------------------------------------------------------------------------------------------------ Recent Labs    09/09/20 0906  TSH 1.705    Cardiac Enzymes No results for input(s): CKMB, TROPONINI, MYOGLOBIN in the last 168 hours.  Invalid input(s):  CK ------------------------------------------------------------------------------------------------------------------    Component Value Date/Time   BNP 34.0 09/09/2020 0175    Micro Results Recent Results (from the past 240 hour(s))  MRSA PCR Screening     Status: None   Collection Time: 08/30/20 10:16 PM   Specimen: Nasal Mucosa; Nasopharyngeal  Result Value Ref Range Status   MRSA by PCR NEGATIVE NEGATIVE Final  Comment:        The GeneXpert MRSA Assay (FDA approved for NASAL specimens only), is one component of a comprehensive MRSA colonization surveillance program. It is not intended to diagnose MRSA infection nor to guide or monitor treatment for MRSA infections. Performed at [facility], [place] [address] [place], [place] [address]   Culture, blood (single)     Status: None   Collection Time: 09/03/20  5:06 PM   Specimen: BLOOD LEFT HAND  Result Value Ref Range Status   Specimen Description BLOOD LEFT HAND  Final   Special Requests   Final    BOTTLES DRAWN AEROBIC AND ANAEROBIC Blood Culture adequate volume   Culture   Final    NO GROWTH 5 DAYS Performed at [facility], [place] [address], [place], [place] [address]    Report Status 09/08/2020 FINAL  Final    Radiology Reports CT ANGIO CHEST PE W OR WO CONTRAST  Result Date: 08/30/2020 CLINICAL DATA:  COVID-19 infection, now with cough, fever and shortness of breath for the past 2 days. Evaluate for pulmonary embolism. EXAM: CT ANGIOGRAPHY CHEST WITH CONTRAST TECHNIQUE: Multidetector CT imaging of the chest was performed using the standard protocol during bolus administration of intravenous contrast. Multiplanar CT image reconstructions and MIPs were obtained to evaluate the vascular anatomy. CONTRAST:  6mL OMNIPAQUE IOHEXOL 350 MG/ML SOLN COMPARISON:  Chest CT-08/29/2020; 01/12/2018; 05/24/2017 FINDINGS: Vascular Findings: There is suboptimal opacification of the pulmonary arterial system with the main  pulmonary artery measuring only 195 Hounsfield units. Given this limitation, there are no discrete filling defects within the pulmonary arterial tree to the level the bilateral subsegmental pulmonary arteries. Evaluation of the distal subsegmental pulmonary arteries is degraded secondary to a combination of suboptimal vessel opacification as well as patient respiratory artifact. Normal caliber of the main pulmonary artery. Cardiomegaly.  No pericardial effusion. Mild ectasia of the ascending thoracic aorta measuring 38 mm in diameter. Conventional configuration of the aortic arch. The descending thoracic aorta is of normal caliber and widely patent without hemodynamically significant narrowing. There is a minimal amount of atherosclerotic plaque involving the aortic arch and proximal descending thoracic aorta, not resulting in hemodynamically significant stenosis. Review of the MIP images confirms the above findings. ---------------------------------------------------------------------------------- Nonvascular Findings: Mediastinum/Lymph Nodes: Scattered prominent mediastinal and hilar lymph nodes are not enlarged by size criteria with index AP window lymph node measuring 0.9 cm in greatest short axis diameter (image 33, series 5) and index left infrahilar lymph node measuring 0.8 cm (image 43, series 5), presumably reactive in etiology. No bulky mediastinal, hilar or axillary lymphadenopathy. Lungs/Pleura: Extensive bilateral heterogeneous and consolidative airspace opacities involving near the entirety of the lungs bilaterally with associated scattered air bronchograms. No pleural effusion or pneumothorax. The central pulmonary airways appear widely patent. Upper abdomen: Limited arterial phase of the upper abdomen demonstrates multiple cholesterol containing gallstones within an otherwise normal-appearing gallbladder. Musculoskeletal: No acute or aggressive osseous abnormalities. Stigmata of dish within the  thoracic spine. Regional soft tissues appear normal. Normal appearance of the thyroid gland. IMPRESSION: 1. No evidence of pulmonary embolism to the level the bilateral subsegmental pulmonary arteries. 2. Extensive heterogeneous and consolidative airspace opacities involving near the entirety of the lungs bilaterally compatible with provided history of COVID 19 pneumonia. 3. Cardiomegaly. 4. Aortic Atherosclerosis (ICD10-I70.0). 5. Cholelithiasis. Electronically Signed   By: Sandi Mariscal M.D.   On: 08/30/2020 09:00   DG Chest Port 1 View  Result Date: 09/09/2020 CLINICAL DATA:  COVID positive, shortness of breath EXAM: PORTABLE CHEST 1 VIEW COMPARISON:  09/03/2020 FINDINGS: Severe diffuse bilateral airspace disease, worsening since prior study. Heart is borderline in size. No visible effusions or pneumothorax. IMPRESSION: Severe diffuse bilateral airspace disease, worsening since prior study. Electronically Signed   By: Rolm Baptise M.D.   On: 09/09/2020 09:21   DG CHEST PORT 1 VIEW  Result Date: 09/03/2020 CLINICAL DATA:  Adult respiratory distress syndrome. Reported COVID-19 positive EXAM: PORTABLE CHEST 1 VIEW COMPARISON:  August 29, 2020 chest radiograph and chest CT August 30, 2020 FINDINGS: There is increased airspace opacity throughout the lungs bilaterally with a more even distribution of airspace opacity compared to prior studies. No consolidation. Heart is mildly enlarged with pulmonary vascularity normal. No adenopathy. No bone lesions. IMPRESSION: Persistent widespread airspace opacity bilaterally without areas of consolidation. Appearance consistent with multifocal atypical organism pneumonia. A degree of underlying ARDS may be present. Stable cardiac prominence. No adenopathy evident by radiography. Electronically Signed   By: Lowella Grip III M.D.   On: 09/03/2020 07:59   DG Chest Port 1 View  Result Date: 08/29/2020 CLINICAL DATA:  COVID-19 positive, hypoxia, cough EXAM:  PORTABLE CHEST 1 VIEW COMPARISON:  01/12/2018 FINDINGS: Single frontal view of the chest demonstrates unremarkable cardiac silhouette. There is widespread bilateral airspace disease, without effusion or pneumothorax. No acute bony abnormality. IMPRESSION: 1. Bilateral multifocal pneumonia compatible with COVID 19. Electronically Signed   By: Randa Ngo M.D.   On: 08/29/2020 22:54   VAS [place] LOWER EXTREMITY VENOUS (DVT)  Result Date: 09/03/2020  Lower Venous DVTStudy Indications: Edema.  Limitations: Patient positioning. Comparison Study: no prior Performing Technologist: Abram Sander RVS  Examination Guidelines: A complete evaluation includes B-mode imaging, spectral Doppler, color Doppler, and power Doppler as needed of all accessible portions of each vessel. Bilateral testing is considered an integral part of a complete examination. Limited examinations for reoccurring indications may be performed as noted. The reflux portion of the exam is performed with the patient in reverse Trendelenburg.  +---------+---------------+---------+-----------+----------+--------------+ RIGHT    CompressibilityPhasicitySpontaneityPropertiesThrombus Aging +---------+---------------+---------+-----------+----------+--------------+ CFV      Full           Yes      Yes                                 +---------+---------------+---------+-----------+----------+--------------+ SFJ      Full                                                        +---------+---------------+---------+-----------+----------+--------------+ FV Prox  Full                                                        +---------+---------------+---------+-----------+----------+--------------+ FV Mid   Full                                                        +---------+---------------+---------+-----------+----------+--------------+ FV DistalFull                                                         +---------+---------------+---------+-----------+----------+--------------+  PFV      Full                                                        +---------+---------------+---------+-----------+----------+--------------+ POP      Full           Yes      Yes                                 +---------+---------------+---------+-----------+----------+--------------+ PTV      Full                                                        +---------+---------------+---------+-----------+----------+--------------+ PERO     Full                                                        +---------+---------------+---------+-----------+----------+--------------+   +---------+---------------+---------+-----------+----------+--------------+ LEFT     CompressibilityPhasicitySpontaneityPropertiesThrombus Aging +---------+---------------+---------+-----------+----------+--------------+ CFV      Full           Yes      Yes                                 +---------+---------------+---------+-----------+----------+--------------+ SFJ      Full                                                        +---------+---------------+---------+-----------+----------+--------------+ FV Prox  Full                                                        +---------+---------------+---------+-----------+----------+--------------+ FV Mid   Full                                                        +---------+---------------+---------+-----------+----------+--------------+ FV DistalFull                                                        +---------+---------------+---------+-----------+----------+--------------+ PFV      Full                                                        +---------+---------------+---------+-----------+----------+--------------+  POP      Full           Yes      Yes                                  +---------+---------------+---------+-----------+----------+--------------+ PTV      Full                                                        +---------+---------------+---------+-----------+----------+--------------+ PERO                                                  Not visualized +---------+---------------+---------+-----------+----------+--------------+     Summary: BILATERAL: - No evidence of deep vein thrombosis seen in the lower extremities, bilaterally. - No evidence of superficial venous thrombosis in the lower extremities, bilaterally. -   *See table(s) above for measurements and observations. Electronically signed by Servando Snare MD on 09/03/2020 at 7:12:00 PM.    Final    ECHOCARDIOGRAM LIMITED  Result Date: 09/02/2020    ECHOCARDIOGRAM LIMITED REPORT   Patient Name:   BERLINE SEMRAD Date of Exam: 09/01/2020 Medical Rec #:  [id number]           Height:       64.0 in Accession #:    [id number]          Weight:       255.3 lb Date of Birth:  04-15-1956           BSA:          2.170 m Patient Age:    13 years            BP:           122/79 mmHg Patient Gender: F                   HR:           97 bpm. Exam Location:  Inpatient Procedure: 2D Echo                                 MODIFIED REPORT: This report was modified by Sanda Klein MD on 09/02/2020 due to Corrected IVC                                    statement.  Indications:     786.09 dyspnea  History:         Patient has no prior history of Echocardiogram examinations.                  Risk Factors:Former Smoker. Covid +.  Sonographer:     Jannett Celestine RDCS (AE) Referring Phys:  [id number] [place] Clam Diagnosing Phys: Sanda Klein MD IMPRESSIONS  1. Left ventricular ejection fraction, by estimation, is 60 to 65%. The left ventricle has normal function. The left ventricle has no regional wall motion abnormalities. Left ventricular diastolic parameters were normal.  2. Right ventricular systolic function is  normal.  The right ventricular size is normal. There is severely elevated pulmonary artery systolic pressure.  3. The mitral valve is normal in structure. No evidence of mitral valve regurgitation. No evidence of mitral stenosis.  4. The aortic valve is normal in structure. Aortic valve regurgitation is not visualized. No aortic stenosis is present.  5. The inferior vena cava is dilated in size with <50% respiratory variability, suggesting right atrial pressure of 15 mmHg. FINDINGS  Left Ventricle: Left ventricular ejection fraction, by estimation, is 60 to 65%. The left ventricle has normal function. The left ventricle has no regional wall motion abnormalities. The left ventricular internal cavity size was normal in size. There is  no left ventricular hypertrophy. Left ventricular diastolic parameters were normal. Right Ventricle: The right ventricular size is normal. No increase in right ventricular wall thickness. Right ventricular systolic function is normal. There is severely elevated pulmonary artery systolic pressure. The tricuspid regurgitant velocity is 3.43 m/s, and with an assumed right atrial pressure of 15 mmHg, the estimated right ventricular systolic pressure is 21.1 mmHg. Left Atrium: Left atrial size was normal in size. Right Atrium: Right atrial size was normal in size. Pericardium: There is no evidence of pericardial effusion. Mitral Valve: The mitral valve is normal in structure. No evidence of mitral valve stenosis. Tricuspid Valve: The tricuspid valve is normal in structure. Tricuspid valve regurgitation is trivial. No evidence of tricuspid stenosis. Aortic Valve: The aortic valve is normal in structure. Aortic valve regurgitation is not visualized. No aortic stenosis is present. Pulmonic Valve: The pulmonic valve was normal in structure. Pulmonic valve regurgitation is not visualized. No evidence of pulmonic stenosis. Aorta: The aortic root is normal in size and structure. Venous: The inferior vena  cava is dilated in size with less than 50% respiratory variability, suggesting right atrial pressure of 15 mmHg. IAS/Shunts: No atrial level shunt detected by color flow Doppler. LEFT VENTRICLE PLAX 2D LVIDd:         4.25 cm Diastology LVIDs:         3.06 cm LV e' medial:    8.38 cm/s LV PW:         0.96 cm LV E/e' medial:  8.9 LV IVS:        0.93 cm LV e' lateral:   12.40 cm/s                        LV E/e' lateral: 6.0  MITRAL VALVE               TRICUSPID VALVE MV Area (PHT): 4.26 cm    TR Peak grad:   47.1 mmHg MV Decel Time: 178 msec    TR Vmax:        343.00 cm/s MV E velocity: 74.55 cm/s MV A velocity: 68.98 cm/s MV E/A ratio:  1.08 Mihai Croitoru MD Electronically signed by Sanda Klein MD Signature Date/Time: 09/01/2020/3:40:50 PM    Final (Updated)

## 2020-09-09 NOTE — Progress Notes (Signed)
   09/09/20 0935  Assess: MEWS Score  Temp 98.2 F (36.8 C)  BP 119/69  Pulse Rate (!) 116  ECG Heart Rate (!) 117  Resp (!) 37  Level of Consciousness Alert  SpO2 (!) 87 %  O2 Device HFNC;Non-rebreather Mask  O2 Flow Rate (L/min) 50 L/min  FiO2 (%) 100 %  Assess: MEWS Score  MEWS Temp 0  MEWS Systolic 0  MEWS Pulse 2  MEWS RR 3  MEWS LOC 0  MEWS Score 5  MEWS Score Color Red  Assess: if the MEWS score is Yellow or Red  Were vital signs taken at a resting state? Yes  Focused Assessment Change from prior assessment (see assessment flowsheet)  Early Detection of Sepsis Score *See Row Information* Low  MEWS guidelines implemented *See Row Information* Yes  Treat  MEWS Interventions Escalated (See documentation below)  Pain Scale 0-10  Pain Score 0  Breathing 1  Take Vital Signs  Increase Vital Sign Frequency  Red: Q 1hr X 4 then Q 4hr X 4, if remains red, continue Q 4hrs  Escalate  MEWS: Escalate Red: discuss with charge nurse/RN and provider, consider discussing with RRT  Notify: Charge Nurse/RN  Name of Charge Nurse/RN Notified Primitivo Gauze RN  Date Charge Nurse/RN Notified 09/09/20  Time Charge Nurse/RN Notified 1657  Notify: Provider  Provider Name/Title Dr. Candiss Norse  Date Provider Notified 09/09/20  Time Provider Notified [phone]  Notification Type Page  Document  Patient Outcome Not stable and remains on department  Progress note created (see row info) Yes

## 2020-09-09 NOTE — Progress Notes (Signed)
Sputum specimen container at pt's bedside table. Pt given instructions.

## 2020-09-09 NOTE — Evaluation (Signed)
Physical Therapy Evaluation Patient Details Name: Wendy Ruiz MRN: [id number] DOB: Jun 21, 1956 Today's Date: 09/09/2020   History of Present Illness  Pt is a 64 y.o. female admitted 08/29/20 not feeling well, apparently had syncopal episode and woke up to EMS present for hospital transport. CXR with bilateral infiltrates; pt hypoxic requiring NRB. PMH includes Bipolar 1, chronic pain, obesity.    Clinical Impression  Pt presents with an overall decrease in functional mobility secondary to above. PTA, pt independent, working and lives alone, has daughters nearby who can assist if needed. Initiated education re: current condition, O2 needs, activity recommendations, therex, energy conservation, and importance of mobility. Today, pt able to tolerate sitting and brief standing activity with min guard for balance; pt moving well, but limited by significant decreased activity tolerance. SpO2 down to 76% on 50L O2 HHFNC (100% FiO2) + NRB. Pt would benefit from continued acute PT services to maximize functional mobility and independence prior to d/c with HHPT services.    Follow Up Recommendations Home health PT;Supervision for mobility/OOB    Equipment Recommendations   (TBD)    Recommendations for Other Services       Precautions / Restrictions        Mobility  Bed Mobility Overal bed mobility: Modified Independent             General bed mobility comments: Moving well, prolonged sitting rest break at EOB to recover from activity to sit up  Transfers Overall transfer level: Needs assistance Equipment used: None Transfers: Sit to/from Stand Sit to Stand: Min guard         General transfer comment: Min guard for balance and safety with lines  Ambulation/Gait Ambulation/Gait assistance: Min guard Gait Distance (Feet): 1 Feet Assistive device: None Gait Pattern/deviations: Step-to pattern;Trunk flexed     General Gait Details: Steps to recliner with min guard for  balance, pt reaching to arm rest for added stability; further mobility limited by hypoxia and tachycardia with minimal movement; also limited by HHFNC/lines  Stairs            Wheelchair Mobility    Modified Rankin (Stroke Patients Only)       Balance Overall balance assessment: Needs assistance   Sitting balance-Leahy Scale: Good       Standing balance-Leahy Scale: Fair                               Pertinent Vitals/Pain Pain Assessment: No/denies pain    Home Living Family/patient expects to be discharged to:: Private residence Living Arrangements: Alone Available Help at Discharge: Family;Available 24 hours/day Type of Home: House Home Access: Level entry     Home Layout: One level Home Equipment: Grab bars - tub/shower Additional Comments: Reports she has 2 daughters nearby (one works at [employer]), one she would stay with has no stairs    Prior Function Level of Independence: Independent         Comments: 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     Hand Dominance        Extremity/Trunk Assessment   Upper Extremity Assessment Upper Extremity Assessment: Overall WFL for tasks assessed    Lower Extremity Assessment Lower Extremity Assessment: Generalized weakness       Communication   Communication: No difficulties  Cognition Arousal/Alertness: Awake/alert Behavior During Therapy: WFL for tasks assessed/performed Overall Cognitive Status: Within Functional Limits for  tasks assessed                                 General Comments: WFL for simple tasks and conversation, kept talking to minimal due to SOB on HHFNC + NRB      General Comments General comments (skin integrity, edema, etc.): SpO2 down to 76% on 50L O2 HHFNC at 100% FiO2 + 15L NRB with minimal standing activity, back up to 93% with prolonged seated rest; RR 30s, HR up to 120s    Exercises  Other Exercises Other Exercises: HEP handout provided and demonstrated (deferred performing therex this session secondary to vitals)   Assessment/Plan    PT Assessment Patient needs continued PT services  PT Problem List Decreased strength;Decreased activity tolerance;Decreased balance;Decreased mobility;Decreased knowledge of use of DME;Cardiopulmonary status limiting activity       PT Treatment Interventions DME instruction;Gait training;Stair training;Functional mobility training;Therapeutic activities;Therapeutic exercise;Balance training;Patient/family education    PT Goals (Current goals can be found in the Care Plan section)  Acute Rehab PT Goals Patient Stated Goal: "I know it's going to be a process, but I hope to get through this" PT Goal Formulation: With patient Time For Goal Achievement: 09/23/20 Potential to Achieve Goals: Good    Frequency Min 3X/week   Barriers to discharge        Co-evaluation               AM-PAC PT "6 Clicks" Mobility  Outcome Measure Help needed turning from your back to your side while in a flat bed without using bedrails?: None Help needed moving from lying on your back to sitting on the side of a flat bed without using bedrails?: None Help needed moving to and from a bed to a chair (including a wheelchair)?: A Little Help needed standing up from a chair using your arms (e.g., wheelchair or bedside chair)?: A Little Help needed to walk in hospital room?: A Little Help needed climbing 3-5 steps with a railing? : A Lot 6 Click Score: 19    End of Session Equipment Utilized During Treatment: Oxygen Activity Tolerance: Patient tolerated treatment well;Treatment limited secondary to medical complications (Comment) Patient left: in chair;with call bell/phone within reach Nurse Communication: Mobility status PT Visit Diagnosis: Other abnormalities of gait and mobility (R26.89)    Time: 4259-5638 PT Time Calculation (min) (ACUTE  ONLY): 24 min   Charges:   PT Evaluation $PT Eval Moderate Complexity: 1 Mod PT Treatments $Therapeutic Activity: 8-22 mins       Mabeline Caras, PT, DPT Acute Rehabilitation Services  Pager [phone] Office [place] 09/09/2020, 12:45 PM

## 2020-09-09 NOTE — Progress Notes (Signed)
FPTS Interim Progress Note  Went to see patient this afternoon to see how she was doing. She notes that she is doing well. She has hope and keeps working hard to feel better. She is happy to have some therapy exercises to work on.   Also inquired about desired medical team since transition out of ICU status. Explained she is currently under a third medical team. Discussed continuity of care and informed her that [facility] would be happy to continue her care for the remainder of her hospital stay if desired. She voiced desire to continue care with the current medical team ([facility]).   We appreciate the care that CCM and [facility] are providing for this patient. Given her request to continue her care with [facility], FPTS will officially sign off. We are available if needed.  Mina Marble [place], DO 09/09/2020, 4:57 PM PGY-3, [facility] pager [phone]

## 2020-09-10 DIAGNOSIS — J80 Acute respiratory distress syndrome: Secondary | ICD-10-CM | POA: Diagnosis not present

## 2020-09-10 DIAGNOSIS — U071 COVID-19: Secondary | ICD-10-CM | POA: Diagnosis not present

## 2020-09-10 DIAGNOSIS — J9601 Acute respiratory failure with hypoxia: Secondary | ICD-10-CM | POA: Diagnosis not present

## 2020-09-10 LAB — MAGNESIUM: Magnesium: 2.1 mg/dL (ref 1.7–2.4)

## 2020-09-10 LAB — CBC WITH DIFFERENTIAL/PLATELET
Abs Immature Granulocytes: 0.18 10*3/uL — ABNORMAL HIGH (ref 0.00–0.07)
Basophils Absolute: 0 10*3/uL (ref 0.0–0.1)
Basophils Relative: 0 %
Eosinophils Absolute: 0 10*3/uL (ref 0.0–0.5)
Eosinophils Relative: 0 %
HCT: 35.2 % — ABNORMAL LOW (ref 36.0–46.0)
Hemoglobin: 10.8 g/dL — ABNORMAL LOW (ref 12.0–15.0)
Immature Granulocytes: 1 %
Lymphocytes Relative: 2 %
Lymphs Abs: 0.5 10*3/uL — ABNORMAL LOW (ref 0.7–4.0)
MCH: 26.8 pg (ref 26.0–34.0)
MCHC: 30.7 g/dL (ref 30.0–36.0)
MCV: 87.3 fL (ref 80.0–100.0)
Monocytes Absolute: 0.7 10*3/uL (ref 0.1–1.0)
Monocytes Relative: 3 %
Neutro Abs: 20.3 10*3/uL — ABNORMAL HIGH (ref 1.7–7.7)
Neutrophils Relative %: 94 %
Platelets: 444 10*3/uL — ABNORMAL HIGH (ref 150–400)
RBC: 4.03 MIL/uL (ref 3.87–5.11)
RDW: 12.6 % (ref 11.5–15.5)
WBC: 21.7 10*3/uL — ABNORMAL HIGH (ref 4.0–10.5)
nRBC: 0 % (ref 0.0–0.2)

## 2020-09-10 LAB — COMPREHENSIVE METABOLIC PANEL
ALT: 100 U/L — ABNORMAL HIGH (ref 0–44)
AST: 61 U/L — ABNORMAL HIGH (ref 15–41)
Albumin: 2.3 g/dL — ABNORMAL LOW (ref 3.5–5.0)
Alkaline Phosphatase: 71 U/L (ref 38–126)
Anion gap: 14 (ref 5–15)
BUN: 22 mg/dL (ref 8–23)
CO2: 22 mmol/L (ref 22–32)
Calcium: 8.9 mg/dL (ref 8.9–10.3)
Chloride: 95 mmol/L — ABNORMAL LOW (ref 98–111)
Creatinine, Ser: 0.86 mg/dL (ref 0.44–1.00)
GFR calc Af Amer: >60 mL/min (ref >60)
GFR calc non Af Amer: >60 mL/min (ref >60)
Glucose, Bld: 286 mg/dL — ABNORMAL HIGH (ref 70–99)
Potassium: 3.8 mmol/L (ref 3.5–5.1)
Sodium: 131 mmol/L — ABNORMAL LOW (ref 135–145)
Total Bilirubin: 0.7 mg/dL (ref 0.3–1.2)
Total Protein: 6.6 g/dL (ref 6.5–8.1)

## 2020-09-10 LAB — GLUCOSE, CAPILLARY
Glucose-Capillary: 123 mg/dL — ABNORMAL HIGH (ref 70–99)
Glucose-Capillary: 201 mg/dL — ABNORMAL HIGH (ref 70–99)
Glucose-Capillary: 208 mg/dL — ABNORMAL HIGH (ref 70–99)
Glucose-Capillary: 213 mg/dL — ABNORMAL HIGH (ref 70–99)
Glucose-Capillary: 216 mg/dL — ABNORMAL HIGH (ref 70–99)
Glucose-Capillary: 74 mg/dL (ref 70–99)

## 2020-09-10 LAB — D-DIMER, QUANTITATIVE: D-Dimer, Quant: 7.65 ug/mL-FEU — ABNORMAL HIGH (ref 0.00–0.50)

## 2020-09-10 LAB — BRAIN NATRIURETIC PEPTIDE: B Natriuretic Peptide: 18.3 pg/mL (ref 0.0–100.0)

## 2020-09-10 LAB — C-REACTIVE PROTEIN: CRP: 6.3 mg/dL — ABNORMAL HIGH (ref <1.0)

## 2020-09-10 LAB — PROCALCITONIN: Procalcitonin: <0.1 ng/mL

## 2020-09-10 MED ORDER — METOPROLOL TARTRATE 12.5 MG HALF TABLET: 12.5000 mg | ORAL_TABLET | Freq: Two times a day (BID) | ORAL | Status: DC

## 2020-09-10 MED ORDER — SODIUM CHLORIDE 0.9 % IV SOLN: INTRAVENOUS | Status: AC

## 2020-09-10 MED ORDER — METHYLPREDNISOLONE SODIUM SUCC 125 MG IJ SOLR
60.0000 mg | Freq: Every day | INTRAMUSCULAR | Status: DC
  Administered 2020-09-11 – 2020-09-12 (×2): 60 mg via INTRAVENOUS
  Filled 2020-09-10 (×2): qty 2

## 2020-09-10 MED ORDER — INSULIN DETEMIR 100 UNIT/ML ~~LOC~~ SOLN
10.0000 [IU] | Freq: Two times a day (BID) | SUBCUTANEOUS | Status: DC
  Administered 2020-09-10 – 2020-09-17 (×13): 10 [IU] via SUBCUTANEOUS
  Filled 2020-09-10 (×17): qty 0.1

## 2020-09-10 NOTE — TOC Progression Note (Signed)
Transition of Care [facility]) - Progression Note    Patient Details  Name: Wendy Ruiz MRN: [id number] Date of Birth: [birth year]  [facility]) CM/SW Otho, RN Phone Number: 09/10/2020, 4:42 PM  Clinical Narrative:     Transferred back from ICU to Progressive care,still on 50L and nonrebreather. Making slow progress. , days away from discharge. CM will continue following for needs.   Expected Discharge Plan: Home/Self Care Barriers to Discharge: Continued Medical Work up  Expected Discharge Plan and Services Expected Discharge Plan: Home/Self Care   Discharge Planning Services: CM Consult                                           Social Determinants of Health (SDOH) Interventions    Readmission Risk Interventions No flowsheet data found.

## 2020-09-10 NOTE — Progress Notes (Signed)
PROGRESS NOTE                                                                                                                                                                                                             Patient Demographics:    Wendy Ruiz, is a 64 y.o. female, DOB - 26-Mar-1956, DEY:[id number]  Outpatient Primary MD for the patient is Lyndee Hensen, DO    LOS - 11  Admit date - 08/29/2020    Chief Complaint  Patient presents with  . Shortness of Breath       Brief Narrative  - 64 year old African-American female with past medical history of bipolar disorder, obesity, GERD who was admitted to the hospital on 08/29/2020 with severe acute hypoxic respiratory failure due to COVID-19 pneumonia, unfortunately patient is not vaccinated.  She was under the care of ICU and was BiPAP dependent for a while.  She has been transferred to hospitalist service on 09/09/2020.  Currently on 50 L of heated high flow at 100% FiO2 along with nonrebreather mask.   Subjective:   Patient in bed, appears comfortable, denies any headache, no fever, no chest pain or pressure, ++ shortness of breath , no abdominal pain. No focal weakness.    Assessment  & Plan :     1. Acute Hypoxic Resp. Failure due to Acute Covid 19 Viral Pneumonitis during the ongoing 2020 Covid 19 Pandemic - she is unfortunately not vaccinated and has incurred severe parenchymal lung injury, she is being treated with combination of remdesivir, IV steroids and Baricitinib.  Was under the care of ICU and transferred to hospitalist service on 09/09/2020, she is currently on 50 L heated high flow and 100% FiO2 plus nonrebreather.  If any worse we will move her to ICU for BiPAP or intubation.  Extremely guarded.  Encouraged the patient to sit up in chair in the daytime use I-S and flutter valve for pulmonary toiletry and then prone in bed when at night.  Will advance  activity and titrate down oxygen as possible.     SpO2: 91 % O2 Flow Rate (L/min): 50 L/min FiO2 (%): 100 %  Recent Labs  Lab 09/04/20 1206 09/05/20 0246 09/05/20 0643 09/06/20 0729 09/07/20 1758 09/09/20 0906  WBC 28.3* 20.2*  --  22.9*  --  27.1*  PLT 419* 457*  --  504*  --  497*  CRP 13.9*  --   --   --   --  8.0*  BNP  --   --   --   --   --  34.0  DDIMER  --   --  5.32* 3.46*  --  14.76*  PROCALCITON  --   --   --   --   --  <0.10  AST 43* 40  --  30 43*  --   ALT 46* 44  --  39 49*  --   ALKPHOS 90 76  --  69 78  --   BILITOT 0.7 0.9  --  0.9 0.7  --   ALBUMIN 2.8* 2.6*  --  2.5* 2.3*  --      2.  Extremely elevated D-dimer.  Full dose Lovenox.  Initial CTA and lower extremity leg ultrasound unremarkable but this was around the time of admission, echo shows increased RV systolic pressure, continue full dose Lovenox, repeat lower extremity venous duplex, once clinically stable repeat CTA.  For now continue full dose anticoagulation.   3.  Intermediate quanteferon checked in ICU.  Outpatient monitoring.  CTA does not show any signs of active tuberculosis.  4.  GERD.  PPI.    Condition - Extremely Guarded  Family Communication  :   Daughter [name] [phone] on 09/09/2020 updated, message left on 09/10/2020 at 11:19 AM.  Daughter Joi [phone] on 09/09/20 updated,    Code Status : Full  Consults  :  PCCM  Procedures  :    CTA 9/19 with no PE, near confluent dense groundglass opacities in all lobes of bilateral lungs 09/02/2019 one 2D echo essentially unremarkable  923 lower extremity Doppler studies-> neg 9/27 - repeat duplex  - pending   PUD Prophylaxis : PPI  Disposition Plan  :    Status is: Inpatient  Remains inpatient appropriate because:IV treatments appropriate due to intensity of illness or inability to take PO   Dispo: The patient is from: Home              Anticipated d/c is to: Home              Anticipated d/c date is: > 3 days               Patient currently is not medically stable to d/c.   DVT Prophylaxis  :  Lovenox   Lab Results  Component Value Date   PLT 497 (H) 09/09/2020    Diet :  Diet Order            Diet Carb Modified Fluid consistency: Thin; Room service appropriate? Yes  Diet effective now                  Inpatient Medications  Scheduled Meds: . baricitinib  4 mg Oral Daily  . Chlorhexidine Gluconate Cloth  6 each Topical Daily  . enoxaparin (LOVENOX) injection  110 mg Subcutaneous Q12H  . feeding supplement (ENSURE ENLIVE)  237 mL Oral TID  . insulin aspart  0-15 Units Subcutaneous Q4H  . insulin detemir  10 Units Subcutaneous BID  . levofloxacin  750 mg Oral Daily  . mouth rinse  15 mL Mouth Rinse BID  . [START ON 09/11/2020] methylPREDNISolone (SOLU-MEDROL) injection  60 mg Intravenous Daily  . [START ON 09/11/2020] metoprolol tartrate  12.5 mg Oral BID  . pantoprazole  40 mg Oral Q1200  . polyethylene glycol  17 g Oral  BID   Continuous Infusions: . sodium chloride 10 mL/hr at 09/07/20 2000   PRN Meds:.sodium chloride, acetaminophen, guaiFENesin-dextromethorphan, lip balm, naphazoline-glycerin, phenol, sodium chloride  Antibiotics  :    Anti-infectives (From admission, onward)   Start     Dose/Rate Route Frequency Ordered Stop   09/09/20 1415  levofloxacin (LEVAQUIN) tablet 750 mg        750 mg Oral Daily 09/09/20 1351 09/12/20 0959   09/03/20 1115  ceFEPIme (MAXIPIME) 2 g in sodium chloride 0.9 % 100 mL IVPB  Status:  Discontinued        2 g 200 mL/hr over 30 Minutes Intravenous Every 8 hours 09/03/20 1104 09/08/20 1140   09/03/20 1100  azithromycin (ZITHROMAX) 500 mg in sodium chloride 0.9 % 250 mL IVPB  Status:  Discontinued        500 mg 250 mL/hr over 60 Minutes Intravenous Every 24 hours 09/03/20 1052 09/04/20 0854   08/31/20 1000  remdesivir 100 mg in sodium chloride 0.9 % 100 mL IVPB       "Followed by" Linked Group Details   100 mg 200 mL/hr over 30 Minutes  Intravenous Daily 08/30/20 0306 09/03/20 1000   08/30/20 0400  remdesivir 200 mg in sodium chloride 0.9% 250 mL IVPB       "Followed by" Linked Group Details   200 mg 580 mL/hr over 30 Minutes Intravenous Once 08/30/20 0306 08/30/20 0647       Time Spent in minutes  30   Lala Lund M.D on 09/10/2020 at 11:16 AM  To page go to [email or web address] - password [facility]  [facility] -  Office  [phone]     See all Orders from today for further details    Objective:   Vitals:   09/10/20 0408 09/10/20 0510 09/10/20 0745 09/10/20 0845  BP:  96/70 97/64   Pulse: 96 96 (!) 105   Resp:  (!) 24 (!) 23   Temp:  (!) 97.1 F (36.2 C) 98.1 F (36.7 C)   TempSrc:  Oral Axillary   SpO2: 91% (!) 89% 92% 91%  Weight:      Height:        Wt Readings from Last 3 Encounters:  09/07/20 108.5 kg  05/14/13 102.5 kg  07/15/12 95.3 kg     Intake/Output Summary (Last 24 hours) at 09/10/2020 1116 Last data filed at 09/10/2020 0900 Gross per 24 hour  Intake 440 ml  Output 1900 ml  Net -1460 ml     Physical Exam  Awake Alert, No new F.N deficits, Normal affect [place].AT,PERRAL Supple Neck,No JVD, No cervical lymphadenopathy appriciated.  Symmetrical Chest wall movement, Good air movement bilaterally, CTAB RRR,No Gallops, Rubs or new Murmurs, No Parasternal Heave +ve B.Sounds, Abd Soft, No tenderness, No organomegaly appriciated, No rebound - guarding or rigidity. No Cyanosis, Clubbing or edema, No new Rash or bruise     Data Review:    CBC Recent Labs  Lab 09/03/20 1125 09/04/20 1206 09/05/20 0246 09/06/20 0729 09/09/20 0906  WBC  --  28.3* 20.2* 22.9* 27.1*  HGB 12.6 11.6* 11.6* 11.2* 10.9*  HCT 37.0 37.6 38.4 37.8 35.6*  PLT  --  419* 457* 504* 497*  MCV  --  84.7 85.7 87.5 86.8  MCH  --  26.1 25.9* 25.9* 26.6  MCHC  --  30.9 30.2 29.6* 30.6  RDW  --  12.5 12.9 12.6 12.7  LYMPHSABS  --  1.1  --   --   --  MONOABS  --  0.8  --   --   --   EOSABS  --  0.0  --    --   --   BASOSABS  --  0.0  --   --   --     Recent Labs  Lab 09/04/20 1206 09/05/20 0246 09/05/20 0643 09/06/20 0729 09/07/20 1758 09/09/20 0906  NA 136 138  --  138 134* 137  K 4.1 4.6  --  4.6 4.9 4.0  CL 97* 99  --  102 100 100  CO2 24 25  --  24 23 24   GLUCOSE 187* 130*  --  86 227* 145*  BUN 20 20  --  21 15 17   CREATININE 1.04* 0.96  --  1.00 0.94 0.88  CALCIUM 8.7* 8.7*  --  8.9 8.7* 9.0  AST 43* 40  --  30 43*  --   ALT 46* 44  --  39 49*  --   ALKPHOS 90 76  --  69 78  --   BILITOT 0.7 0.9  --  0.9 0.7  --   ALBUMIN 2.8* 2.6*  --  2.5* 2.3*  --   MG 2.3 2.6*  --   --  2.4 2.2  CRP 13.9*  --   --   --   --  8.0*  DDIMER  --   --  5.32* 3.46*  --  14.76*  PROCALCITON  --   --   --   --   --  <0.10  TSH  --   --   --   --   --  1.705  BNP  --   --   --   --   --  34.0    ------------------------------------------------------------------------------------------------------------------ No results for input(s): CHOL, HDL, LDLCALC, TRIG, CHOLHDL, LDLDIRECT in the last 72 hours.  Lab Results  Component Value Date   HGBA1C 5.7 (H) 08/31/2020   ------------------------------------------------------------------------------------------------------------------ Recent Labs    09/09/20 0906  TSH 1.705    Cardiac Enzymes No results for input(s): CKMB, TROPONINI, MYOGLOBIN in the last 168 hours.  Invalid input(s): CK ------------------------------------------------------------------------------------------------------------------    Component Value Date/Time   BNP 34.0 09/09/2020 0109    Micro Results Recent Results (from the past 240 hour(s))  Culture, blood (single)     Status: None   Collection Time: 09/03/20  5:06 PM   Specimen: BLOOD LEFT HAND  Result Value Ref Range Status   Specimen Description BLOOD LEFT HAND  Final   Special Requests   Final    BOTTLES DRAWN AEROBIC AND ANAEROBIC Blood Culture adequate volume   Culture   Final    NO GROWTH 5  DAYS Performed at [facility], [address], [place], [place] [address]    Report Status 09/08/2020 FINAL  Final    Radiology Reports CT ANGIO CHEST PE W OR WO CONTRAST  Result Date: 08/30/2020 CLINICAL DATA:  COVID-19 infection, now with cough, fever and shortness of breath for the past 2 days. Evaluate for pulmonary embolism. EXAM: CT ANGIOGRAPHY CHEST WITH CONTRAST TECHNIQUE: Multidetector CT imaging of the chest was performed using the standard protocol during bolus administration of intravenous contrast. Multiplanar CT image reconstructions and MIPs were obtained to evaluate the vascular anatomy. CONTRAST:  29mL OMNIPAQUE IOHEXOL 350 MG/ML SOLN COMPARISON:  Chest CT-08/29/2020; 01/12/2018; 05/24/2017 FINDINGS: Vascular Findings: There is suboptimal opacification of the pulmonary arterial system with the main pulmonary artery measuring only 195 Hounsfield units. Given this  limitation, there are no discrete filling defects within the pulmonary arterial tree to the level the bilateral subsegmental pulmonary arteries. Evaluation of the distal subsegmental pulmonary arteries is degraded secondary to a combination of suboptimal vessel opacification as well as patient respiratory artifact. Normal caliber of the main pulmonary artery. Cardiomegaly.  No pericardial effusion. Mild ectasia of the ascending thoracic aorta measuring 38 mm in diameter. Conventional configuration of the aortic arch. The descending thoracic aorta is of normal caliber and widely patent without hemodynamically significant narrowing. There is a minimal amount of atherosclerotic plaque involving the aortic arch and proximal descending thoracic aorta, not resulting in hemodynamically significant stenosis. Review of the MIP images confirms the above findings. ---------------------------------------------------------------------------------- Nonvascular Findings: Mediastinum/Lymph Nodes: Scattered prominent mediastinal and hilar  lymph nodes are not enlarged by size criteria with index AP window lymph node measuring 0.9 cm in greatest short axis diameter (image 33, series 5) and index left infrahilar lymph node measuring 0.8 cm (image 43, series 5), presumably reactive in etiology. No bulky mediastinal, hilar or axillary lymphadenopathy. Lungs/Pleura: Extensive bilateral heterogeneous and consolidative airspace opacities involving near the entirety of the lungs bilaterally with associated scattered air bronchograms. No pleural effusion or pneumothorax. The central pulmonary airways appear widely patent. Upper abdomen: Limited arterial phase of the upper abdomen demonstrates multiple cholesterol containing gallstones within an otherwise normal-appearing gallbladder. Musculoskeletal: No acute or aggressive osseous abnormalities. Stigmata of dish within the thoracic spine. Regional soft tissues appear normal. Normal appearance of the thyroid gland. IMPRESSION: 1. No evidence of pulmonary embolism to the level the bilateral subsegmental pulmonary arteries. 2. Extensive heterogeneous and consolidative airspace opacities involving near the entirety of the lungs bilaterally compatible with provided history of COVID 19 pneumonia. 3. Cardiomegaly. 4. Aortic Atherosclerosis (ICD10-I70.0). 5. Cholelithiasis. Electronically Signed   By: Sandi Mariscal M.D.   On: 08/30/2020 09:00   DG Chest Port 1 View  Result Date: 09/09/2020 CLINICAL DATA:  COVID positive, shortness of breath EXAM: PORTABLE CHEST 1 VIEW COMPARISON:  09/03/2020 FINDINGS: Severe diffuse bilateral airspace disease, worsening since prior study. Heart is borderline in size. No visible effusions or pneumothorax. IMPRESSION: Severe diffuse bilateral airspace disease, worsening since prior study. Electronically Signed   By: Rolm Baptise M.D.   On: 09/09/2020 09:21   DG CHEST PORT 1 VIEW  Result Date: 09/03/2020 CLINICAL DATA:  Adult respiratory distress syndrome. Reported COVID-19  positive EXAM: PORTABLE CHEST 1 VIEW COMPARISON:  August 29, 2020 chest radiograph and chest CT August 30, 2020 FINDINGS: There is increased airspace opacity throughout the lungs bilaterally with a more even distribution of airspace opacity compared to prior studies. No consolidation. Heart is mildly enlarged with pulmonary vascularity normal. No adenopathy. No bone lesions. IMPRESSION: Persistent widespread airspace opacity bilaterally without areas of consolidation. Appearance consistent with multifocal atypical organism pneumonia. A degree of underlying ARDS may be present. Stable cardiac prominence. No adenopathy evident by radiography. Electronically Signed   By: Lowella Grip III M.D.   On: 09/03/2020 07:59   DG Chest Port 1 View  Result Date: 08/29/2020 CLINICAL DATA:  COVID-19 positive, hypoxia, cough EXAM: PORTABLE CHEST 1 VIEW COMPARISON:  01/12/2018 FINDINGS: Single frontal view of the chest demonstrates unremarkable cardiac silhouette. There is widespread bilateral airspace disease, without effusion or pneumothorax. No acute bony abnormality. IMPRESSION: 1. Bilateral multifocal pneumonia compatible with COVID 19. Electronically Signed   By: Randa Ngo M.D.   On: 08/29/2020 22:54   VAS [place] LOWER EXTREMITY VENOUS (DVT)  Result Date: 09/03/2020  Lower Venous DVTStudy Indications: Edema.  Limitations: Patient positioning. Comparison Study: no prior Performing Technologist: Abram Sander RVS  Examination Guidelines: A complete evaluation includes B-mode imaging, spectral Doppler, color Doppler, and power Doppler as needed of all accessible portions of each vessel. Bilateral testing is considered an integral part of a complete examination. Limited examinations for reoccurring indications may be performed as noted. The reflux portion of the exam is performed with the patient in reverse Trendelenburg.  +---------+---------------+---------+-----------+----------+--------------+ RIGHT     CompressibilityPhasicitySpontaneityPropertiesThrombus Aging +---------+---------------+---------+-----------+----------+--------------+ CFV      Full           Yes      Yes                                 +---------+---------------+---------+-----------+----------+--------------+ SFJ      Full                                                        +---------+---------------+---------+-----------+----------+--------------+ FV Prox  Full                                                        +---------+---------------+---------+-----------+----------+--------------+ FV Mid   Full                                                        +---------+---------------+---------+-----------+----------+--------------+ FV DistalFull                                                        +---------+---------------+---------+-----------+----------+--------------+ PFV      Full                                                        +---------+---------------+---------+-----------+----------+--------------+ POP      Full           Yes      Yes                                 +---------+---------------+---------+-----------+----------+--------------+ PTV      Full                                                        +---------+---------------+---------+-----------+----------+--------------+ PERO     Full                                                        +---------+---------------+---------+-----------+----------+--------------+   +---------+---------------+---------+-----------+----------+--------------+  LEFT     CompressibilityPhasicitySpontaneityPropertiesThrombus Aging +---------+---------------+---------+-----------+----------+--------------+ CFV      Full           Yes      Yes                                 +---------+---------------+---------+-----------+----------+--------------+ SFJ      Full                                                         +---------+---------------+---------+-----------+----------+--------------+ FV Prox  Full                                                        +---------+---------------+---------+-----------+----------+--------------+ FV Mid   Full                                                        +---------+---------------+---------+-----------+----------+--------------+ FV DistalFull                                                        +---------+---------------+---------+-----------+----------+--------------+ PFV      Full                                                        +---------+---------------+---------+-----------+----------+--------------+ POP      Full           Yes      Yes                                 +---------+---------------+---------+-----------+----------+--------------+ PTV      Full                                                        +---------+---------------+---------+-----------+----------+--------------+ PERO                                                  Not visualized +---------+---------------+---------+-----------+----------+--------------+     Summary: BILATERAL: - No evidence of deep vein thrombosis seen in the lower extremities, bilaterally. - No evidence of superficial venous thrombosis in the lower extremities, bilaterally. -   *See table(s) above for measurements and observations. Electronically signed by Servando Snare MD on 09/03/2020 at 7:12:00 PM.    Final  ECHOCARDIOGRAM LIMITED  Result Date: 09/02/2020    ECHOCARDIOGRAM LIMITED REPORT   Patient Name:   AMIAYA MCNEELEY Date of Exam: 09/01/2020 Medical Rec #:  [id number]           Height:       64.0 in Accession #:    [id number]          Weight:       255.3 lb Date of Birth:  12-23-1955           BSA:          2.170 m Patient Age:    89 years            BP:           122/79 mmHg Patient Gender: F                   HR:           97 bpm. Exam Location:   Inpatient Procedure: 2D Echo                                 MODIFIED REPORT: This report was modified by Sanda Klein MD on 09/02/2020 due to Corrected IVC                                    statement.  Indications:     786.09 dyspnea  History:         Patient has no prior history of Echocardiogram examinations.                  Risk Factors:Former Smoker. Covid +.  Sonographer:     Jannett Celestine RDCS (AE) Referring Phys:  [id number] [place] Clam Diagnosing Phys: Sanda Klein MD IMPRESSIONS  1. Left ventricular ejection fraction, by estimation, is 60 to 65%. The left ventricle has normal function. The left ventricle has no regional wall motion abnormalities. Left ventricular diastolic parameters were normal.  2. Right ventricular systolic function is normal. The right ventricular size is normal. There is severely elevated pulmonary artery systolic pressure.  3. The mitral valve is normal in structure. No evidence of mitral valve regurgitation. No evidence of mitral stenosis.  4. The aortic valve is normal in structure. Aortic valve regurgitation is not visualized. No aortic stenosis is present.  5. The inferior vena cava is dilated in size with <50% respiratory variability, suggesting right atrial pressure of 15 mmHg. FINDINGS  Left Ventricle: Left ventricular ejection fraction, by estimation, is 60 to 65%. The left ventricle has normal function. The left ventricle has no regional wall motion abnormalities. The left ventricular internal cavity size was normal in size. There is  no left ventricular hypertrophy. Left ventricular diastolic parameters were normal. Right Ventricle: The right ventricular size is normal. No increase in right ventricular wall thickness. Right ventricular systolic function is normal. There is severely elevated pulmonary artery systolic pressure. The tricuspid regurgitant velocity is 3.43 m/s, and with an assumed right atrial pressure of 15 mmHg, the estimated right ventricular  systolic pressure is 13.2 mmHg. Left Atrium: Left atrial size was normal in size. Right Atrium: Right atrial size was normal in size. Pericardium: There is no evidence of pericardial effusion. Mitral Valve: The mitral valve is normal in structure. No evidence of mitral valve stenosis. Tricuspid Valve: The tricuspid valve is normal  in structure. Tricuspid valve regurgitation is trivial. No evidence of tricuspid stenosis. Aortic Valve: The aortic valve is normal in structure. Aortic valve regurgitation is not visualized. No aortic stenosis is present. Pulmonic Valve: The pulmonic valve was normal in structure. Pulmonic valve regurgitation is not visualized. No evidence of pulmonic stenosis. Aorta: The aortic root is normal in size and structure. Venous: The inferior vena cava is dilated in size with less than 50% respiratory variability, suggesting right atrial pressure of 15 mmHg. IAS/Shunts: No atrial level shunt detected by color flow Doppler. LEFT VENTRICLE PLAX 2D LVIDd:         4.25 cm Diastology LVIDs:         3.06 cm LV e' medial:    8.38 cm/s LV PW:         0.96 cm LV E/e' medial:  8.9 LV IVS:        0.93 cm LV e' lateral:   12.40 cm/s                        LV E/e' lateral: 6.0  MITRAL VALVE               TRICUSPID VALVE MV Area (PHT): 4.26 cm    TR Peak grad:   47.1 mmHg MV Decel Time: 178 msec    TR Vmax:        343.00 cm/s MV E velocity: 74.55 cm/s MV A velocity: 68.98 cm/s MV E/A ratio:  1.08 Mihai Croitoru MD Electronically signed by Sanda Klein MD Signature Date/Time: 09/01/2020/3:40:50 PM    Final (Updated)

## 2020-09-10 NOTE — Evaluation (Signed)
Occupational Therapy Evaluation Patient Details Name: Wendy Ruiz MRN: [id number] DOB: 12/19/1955 Today's Date: 09/10/2020    History of Present Illness Pt is a 64 y.o. female admitted 08/29/20 not feeling well, apparently had syncopal episode and woke up to EMS present for hospital transport. CXR with bilateral infiltrates; pt hypoxic requiring NRB. PMH includes Bipolar 1, chronic pain, obesity.   Clinical Impression   PTA, pt was living alone and was independent working full time. Pt currently requiring Min A for ADLs in standing due to fatigue and decreased balance. Pt very motivated to participate in therapy. Performing two sit<>stand and then 15 reps of marches in standing with UE support. Pt participating in BUE exercises with level one theraband; 10 reps each. Pt would benefit from further acute OT to facilitate safe dc. Recommend dc to home with HHOT for further OT to optimize safety, independence with ADLs, and return to PLOF.   SpO2 >86% on 50L heated HFNC with FIO2 100% + 15L NRB. RR 30-40s upon arrival; 20s with cues. HR 90-100s.     Follow Up Recommendations  Home health OT;Supervision/Assistance - 24 hour (Pending pt progress)    Equipment Recommendations  3 in 1 bedside commode (As shower seat)    Recommendations for Other Services PT consult     Precautions / Restrictions Precautions Precautions: Fall;Other (comment) Precaution Comments: Watch SpO2      Mobility Bed Mobility               General bed mobility comments: In recliner upon arrival  Transfers Overall transfer level: Needs assistance Equipment used: None Transfers: Sit to/from Stand Sit to Stand: Min guard         General transfer comment: Min GUard A for safety.     Balance Overall balance assessment: Needs assistance   Sitting balance-Leahy Scale: Good       Standing balance-Leahy Scale: Fair Standing balance comment: Requiring Min A for balance during dynamic movement                            ADL either performed or assessed with clinical judgement   ADL Overall ADL's : Needs assistance/impaired Eating/Feeding: Set up;Sitting Eating/Feeding Details (indicate cue type and reason): Pt requiring cues for taking small spits and sequence drinking while breathing; pt able to manage NRB mask Grooming: Set up;Supervision/safety;Sitting   Upper Body Bathing: Minimal assistance;Sitting   Lower Body Bathing: Minimal assistance;Sit to/from stand   Upper Body Dressing : Minimal assistance;Sitting   Lower Body Dressing: Minimal assistance;Sit to/from stand Lower Body Dressing Details (indicate cue type and reason): Donning socks with use of figure four method Toilet Transfer: Minimal assistance (simulated at recliner) Toilet Transfer Details (indicate cue type and reason): Min A for balance in standing         Functional mobility during ADLs: Minimal assistance General ADL Comments: Pt presenting with decreased strength, balance, and activity tolerance. Despite fatigue, pt very motivated to participate in therapy     Vision         Perception     Praxis      Pertinent Vitals/Pain Pain Assessment: Faces Faces Pain Scale: Hurts little more Pain Location: Grimacing with coughing Pain Descriptors / Indicators: Grimacing Pain Intervention(s): Monitored during session     Hand Dominance     Extremity/Trunk Assessment Upper Extremity Assessment Upper Extremity Assessment: Overall WFL for tasks assessed   Lower Extremity Assessment Lower Extremity Assessment: Defer to  PT evaluation   Cervical / Trunk Assessment Cervical / Trunk Assessment: Other exceptions Cervical / Trunk Exceptions: Increased body habitus   Communication Communication Communication: No difficulties   Cognition Arousal/Alertness: Awake/alert Behavior During Therapy: WFL for tasks assessed/performed Overall Cognitive Status: Within Functional Limits for tasks  assessed                                 General Comments: Pt very motivated to participate in therapy despite fatigue and SOB.    General Comments  SpO2 >86% on 50L heated HFNC with FIO2 100% + 15L NRB. RR 30-40s upon arrival; 20s with cues. HR 90-100s.     Exercises Exercises: General Upper Extremity General Exercises - Upper Extremity Shoulder Flexion: Strengthening;Both;15 reps;Seated;Theraband Theraband Level (Shoulder Flexion): Level 1 (Yellow) Shoulder Horizontal ABduction: Strengthening;5 reps;10 reps;Seated;Theraband Theraband Level (Shoulder Horizontal Abduction): Level 1 (Yellow) Elbow Flexion: Strengthening;Both;10 reps;Seated;Theraband Theraband Level (Elbow Flexion): Level 1 (Yellow) Elbow Extension: Strengthening;Both;10 reps;Seated;Theraband Theraband Level (Elbow Extension): Level 1 (Yellow) Other Exercises Other Exercises: Purse lip breathing   Shoulder Instructions      Home Living Family/patient expects to be discharged to:: Private residence Living Arrangements: Alone Available Help at Discharge: Family;Available 24 hours/day Type of Home: House Home Access: Level entry     Home Layout: One level     Bathroom Shower/Tub: Teacher, early years/pre: Standard     Home Equipment: Grab bars - tub/shower   Additional Comments: Reports she has 2 daughters nearby (one works at [employer]), one she would stay with has no stairs      Prior Functioning/Environment Level of Independence: Independent        Comments: 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        OT Problem List: Decreased strength;Decreased range of motion;Decreased activity tolerance;Impaired balance (sitting and/or standing);Decreased safety awareness;Decreased knowledge of use of DME or AE;Decreased knowledge of precautions;Cardiopulmonary status limiting activity      OT Treatment/Interventions:  Self-care/ADL training;Therapeutic exercise;Energy conservation;DME and/or AE instruction;Therapeutic activities;Patient/family education    OT Goals(Current goals can be found in the care plan section) Acute Rehab OT Goals Patient Stated Goal: "I know it's going to be a process, but I hope to get through this" OT Goal Formulation: With patient Time For Goal Achievement: 09/24/20 Potential to Achieve Goals: Good  OT Frequency: Min 3X/week   Barriers to D/C:            Co-evaluation              AM-PAC OT "6 Clicks" Daily Activity     Outcome Measure Help from another person eating meals?: A Little Help from another person taking care of personal grooming?: A Little Help from another person toileting, which includes using toliet, bedpan, or urinal?: A Little Help from another person bathing (including washing, rinsing, drying)?: A Little Help from another person to put on and taking off regular upper body clothing?: A Little Help from another person to put on and taking off regular lower body clothing?: A Little 6 Click Score: 18   End of Session Equipment Utilized During Treatment: Oxygen Nurse Communication: Mobility status  Activity Tolerance: Patient limited by fatigue Patient left: in chair;with call bell/phone within reach;with nursing/sitter in room  OT Visit Diagnosis: Unsteadiness on feet (R26.81);Other abnormalities of gait and mobility (R26.89);Muscle weakness (generalized) (M62.81);Pain Pain - part of body:  (  with coughing)                Time: 1330-1405 OT Time Calculation (min): 35 min Charges:  OT General Charges $OT Visit: 1 Visit OT Evaluation $OT Eval Moderate Complexity: 1 Mod OT Treatments $Self Care/Home Management : 8-22 mins  Jossue Rubenstein MSOT, OTR/L Acute Rehab Pager: [phone] Office: [place] 09/10/2020, 2:26 PM

## 2020-09-11 ENCOUNTER — Encounter (HOSPITAL_COMMUNITY): Payer: Self-pay

## 2020-09-11 DIAGNOSIS — J9601 Acute respiratory failure with hypoxia: Secondary | ICD-10-CM | POA: Diagnosis not present

## 2020-09-11 DIAGNOSIS — J80 Acute respiratory distress syndrome: Secondary | ICD-10-CM | POA: Diagnosis not present

## 2020-09-11 DIAGNOSIS — U071 COVID-19: Secondary | ICD-10-CM | POA: Diagnosis not present

## 2020-09-11 LAB — CBC WITH DIFFERENTIAL/PLATELET
Abs Immature Granulocytes: 0 10*3/uL (ref 0.00–0.07)
Basophils Absolute: 0 10*3/uL (ref 0.0–0.1)
Basophils Relative: 0 %
Eosinophils Absolute: 0 10*3/uL (ref 0.0–0.5)
Eosinophils Relative: 0 %
HCT: 35.5 % — ABNORMAL LOW (ref 36.0–46.0)
Hemoglobin: 10.8 g/dL — ABNORMAL LOW (ref 12.0–15.0)
Lymphocytes Relative: 1 %
Lymphs Abs: 0.3 10*3/uL — ABNORMAL LOW (ref 0.7–4.0)
MCH: 26.4 pg (ref 26.0–34.0)
MCHC: 30.4 g/dL (ref 30.0–36.0)
MCV: 86.8 fL (ref 80.0–100.0)
Monocytes Absolute: 0.8 10*3/uL (ref 0.1–1.0)
Monocytes Relative: 3 %
Neutro Abs: 24.6 10*3/uL — ABNORMAL HIGH (ref 1.7–7.7)
Neutrophils Relative %: 96 %
Platelets: 455 10*3/uL — ABNORMAL HIGH (ref 150–400)
RBC: 4.09 MIL/uL (ref 3.87–5.11)
RDW: 12.7 % (ref 11.5–15.5)
WBC: 25.6 10*3/uL — ABNORMAL HIGH (ref 4.0–10.5)
nRBC: 0 % (ref 0.0–0.2)
nRBC: 0 /100 WBC

## 2020-09-11 LAB — MAGNESIUM: Magnesium: 2.2 mg/dL (ref 1.7–2.4)

## 2020-09-11 LAB — C-REACTIVE PROTEIN: CRP: 3.9 mg/dL — ABNORMAL HIGH (ref <1.0)

## 2020-09-11 LAB — COMPREHENSIVE METABOLIC PANEL
ALT: 143 U/L — ABNORMAL HIGH (ref 0–44)
AST: 82 U/L — ABNORMAL HIGH (ref 15–41)
Albumin: 2.5 g/dL — ABNORMAL LOW (ref 3.5–5.0)
Alkaline Phosphatase: 68 U/L (ref 38–126)
Anion gap: 12 (ref 5–15)
BUN: 20 mg/dL (ref 8–23)
CO2: 26 mmol/L (ref 22–32)
Calcium: 9 mg/dL (ref 8.9–10.3)
Chloride: 98 mmol/L (ref 98–111)
Creatinine, Ser: 0.85 mg/dL (ref 0.44–1.00)
GFR calc Af Amer: >60 mL/min (ref >60)
GFR calc non Af Amer: >60 mL/min (ref >60)
Glucose, Bld: 174 mg/dL — ABNORMAL HIGH (ref 70–99)
Potassium: 4 mmol/L (ref 3.5–5.1)
Sodium: 136 mmol/L (ref 135–145)
Total Bilirubin: 0.6 mg/dL (ref 0.3–1.2)
Total Protein: 6.7 g/dL (ref 6.5–8.1)

## 2020-09-11 LAB — GLUCOSE, CAPILLARY
Glucose-Capillary: 126 mg/dL — ABNORMAL HIGH (ref 70–99)
Glucose-Capillary: 92 mg/dL (ref 70–99)
Glucose-Capillary: 119 mg/dL — ABNORMAL HIGH (ref 70–99)
Glucose-Capillary: 174 mg/dL — ABNORMAL HIGH (ref 70–99)
Glucose-Capillary: 201 mg/dL — ABNORMAL HIGH (ref 70–99)
Glucose-Capillary: 128 mg/dL — ABNORMAL HIGH (ref 70–99)

## 2020-09-11 LAB — D-DIMER, QUANTITATIVE: D-Dimer, Quant: 7.18 ug/mL-FEU — ABNORMAL HIGH (ref 0.00–0.50)

## 2020-09-11 LAB — EXPECTORATED SPUTUM ASSESSMENT W GRAM STAIN, RFLX TO RESP C

## 2020-09-11 LAB — PROCALCITONIN: Procalcitonin: <0.1 ng/mL

## 2020-09-11 LAB — BRAIN NATRIURETIC PEPTIDE: B Natriuretic Peptide: 51.9 pg/mL (ref 0.0–100.0)

## 2020-09-11 MED ORDER — LACTATED RINGERS IV SOLN: INTRAVENOUS | Status: AC

## 2020-09-11 MED ORDER — SODIUM CHLORIDE 0.9% FLUSH
10.0000 mL | Freq: Two times a day (BID) | INTRAVENOUS | Status: DC
  Administered 2020-09-11 – 2020-09-23 (×23): 10 mL

## 2020-09-11 MED ORDER — LEVOFLOXACIN 750 MG PO TABS
750.0000 mg | ORAL_TABLET | Freq: Every day | ORAL | Status: AC
  Administered 2020-09-11 – 2020-09-12 (×2): 750 mg via ORAL
  Filled 2020-09-11 (×2): qty 1

## 2020-09-11 MED ORDER — SODIUM CHLORIDE 0.9% FLUSH: 10.0000 mL | INTRAVENOUS | Status: DC | PRN

## 2020-09-11 MED ORDER — METOPROLOL TARTRATE 25 MG PO TABS
25.0000 mg | ORAL_TABLET | Freq: Two times a day (BID) | ORAL | Status: DC
  Administered 2020-09-11 – 2020-09-12 (×2): 25 mg via ORAL
  Filled 2020-09-11 (×2): qty 1

## 2020-09-11 NOTE — Progress Notes (Signed)
VAST consulted to obtain IV access. Attempted x1 unsuccessfully using  Ultrasound guidance. Pt verbalized she has had trouble with IV's and with blood draws since she has been here. Discussed midline placement with her and she agreed.

## 2020-09-11 NOTE — Progress Notes (Signed)
PROGRESS NOTE                                                                                                                                                                                                             Patient Demographics:    Wendy Ruiz, is a 64 y.o. female, DOB - July 27, 1956, RCV:[id number]  Outpatient Primary MD for the patient is Lyndee Hensen, DO    LOS - 12  Admit date - 08/29/2020    Chief Complaint  Patient presents with  . Shortness of Breath       Brief Narrative  - 64 year old African-American female with past medical history of bipolar disorder, obesity, GERD who was admitted to the hospital on 08/29/2020 with severe acute hypoxic respiratory failure due to COVID-19 pneumonia, unfortunately patient is not vaccinated.  She was under the care of ICU and was BiPAP dependent for a while.  She has been transferred to hospitalist service on 09/09/2020.  Currently on 50 L of heated high flow at 100% FiO2 along with nonrebreather mask.   Subjective:   Patient in bed, appears comfortable, denies any headache, no fever, no chest pain or pressure, improved shortness of breath , no abdominal pain. No focal weakness.   Assessment  & Plan :     1. Acute Hypoxic Resp. Failure due to Acute Covid 19 Viral Pneumonitis during the ongoing 2020 Covid 19 Pandemic - she is unfortunately not vaccinated and has incurred severe parenchymal lung injury, she is being treated with combination of remdesivir, IV steroids and Baricitinib.  Was under the care of ICU and transferred to hospitalist service on 09/09/2020, she is currently on 35 L heated high flow and 100% FiO2 plus nonrebreather.  Due to worsening leukocytosis and oxygen demands she has been placed on 5 days of oral Levaquin starting on 10/07/2020 with some improvement, continue to monitor, remains extremely guarded.  Encouraged the patient to sit up in chair in  the daytime use I-S and flutter valve for pulmonary toiletry and then prone in bed when at night.  Will advance activity and titrate down oxygen as possible.     SpO2: 92 % O2 Flow Rate (L/min): 35 L/min FiO2 (%): 60 %  Recent Labs  Lab 09/04/20 1206 09/05/20 0246 09/05/20 0643 09/06/20 0729 09/07/20 1758 09/09/20 0906 09/10/20 1056  WBC 28.3*  20.2*  --  22.9*  --  27.1* 21.7*  PLT 419* 457*  --  504*  --  497* 444*  CRP 13.9*  --   --   --   --  8.0* 6.3*  BNP  --   --   --   --   --  34.0 18.3  DDIMER  --   --  5.32* 3.46*  --  14.76* 7.65*  PROCALCITON  --   --   --   --   --  <0.10 <0.10  AST 43* 40  --  30 43*  --  61*  ALT 46* 44  --  39 49*  --  100*  ALKPHOS 90 76  --  69 78  --  71  BILITOT 0.7 0.9  --  0.9 0.7  --  0.7  ALBUMIN 2.8* 2.6*  --  2.5* 2.3*  --  2.3*     2.  Extremely elevated D-dimer.  Full dose Lovenox.  Initial CTA and lower extremity leg ultrasound unremarkable but this was around the time of admission, echo shows increased RV systolic pressure, continue full dose Lovenox, repeat lower extremity venous duplex, once clinically stable repeat CTA.  For now continue full dose anticoagulation with improving D dimer.   3.  Intermediate quanteferon checked in ICU.  Outpatient monitoring.  CTA does not show any signs of active tuberculosis, patient follow-up with ID post discharge.  4.  GERD.  PPI.  6.  Mild tachycardia.  Stable TSH, CTA negative few weeks ago, no pleuritic chest pain and hypoxia is actually improving, repeat leg ultrasound pending, hydrate, monitor closely.     Condition - Extremely Guarded  Family Communication  :   Daughter [name] [phone] on 09/09/2020 updated, message left on 09/10/2020 at 11:19 AM, message left 09/11/2020 at 8:05 AM  Daughter [name] [phone] on 09/09/20 updated,    Code Status : Full  Consults  :  PCCM  Procedures  :    CTA 9/19 with no PE, near confluent dense groundglass opacities in all lobes of  bilateral lungs 09/02/2019 one 2D echo essentially unremarkable  923 lower extremity Doppler studies-> neg 9/27 - repeat duplex  - pending   PUD Prophylaxis : PPI  Disposition Plan  :    Status is: Inpatient  Remains inpatient appropriate because:IV treatments appropriate due to intensity of illness or inability to take PO   Dispo: The patient is from: Home              Anticipated d/c is to: Home              Anticipated d/c date is: > 3 days              Patient currently is not medically stable to d/c.   DVT Prophylaxis  :  Lovenox   Lab Results  Component Value Date   PLT 444 (H) 09/10/2020    Diet :  Diet Order            Diet Carb Modified Fluid consistency: Thin; Room service appropriate? Yes  Diet effective now                  Inpatient Medications  Scheduled Meds: . baricitinib  4 mg Oral Daily  . Chlorhexidine Gluconate Cloth  6 each Topical Daily  . enoxaparin (LOVENOX) injection  110 mg Subcutaneous Q12H  . feeding supplement (ENSURE ENLIVE)  237 mL Oral TID  . insulin aspart  0-15 Units Subcutaneous Q4H  . insulin detemir  10 Units Subcutaneous BID  . mouth rinse  15 mL Mouth Rinse BID  . methylPREDNISolone (SOLU-MEDROL) injection  60 mg Intravenous Daily  . metoprolol tartrate  25 mg Oral BID  . pantoprazole  40 mg Oral Q1200  . polyethylene glycol  17 g Oral BID   Continuous Infusions: . sodium chloride 10 mL/hr at 09/07/20 2000   PRN Meds:.sodium chloride, acetaminophen, guaiFENesin-dextromethorphan, lip balm, naphazoline-glycerin, phenol, sodium chloride  Antibiotics  :    Anti-infectives (From admission, onward)   Start     Dose/Rate Route Frequency Ordered Stop   09/09/20 1415  levofloxacin (LEVAQUIN) tablet 750 mg        750 mg Oral Daily 09/09/20 1351 09/11/20 0907   09/03/20 1115  ceFEPIme (MAXIPIME) 2 g in sodium chloride 0.9 % 100 mL IVPB  Status:  Discontinued        2 g 200 mL/hr over 30 Minutes Intravenous Every 8 hours  09/03/20 1104 09/08/20 1140   09/03/20 1100  azithromycin (ZITHROMAX) 500 mg in sodium chloride 0.9 % 250 mL IVPB  Status:  Discontinued        500 mg 250 mL/hr over 60 Minutes Intravenous Every 24 hours 09/03/20 1052 09/04/20 0854   08/31/20 1000  remdesivir 100 mg in sodium chloride 0.9 % 100 mL IVPB       "Followed by" Linked Group Details   100 mg 200 mL/hr over 30 Minutes Intravenous Daily 08/30/20 0306 09/03/20 1000   08/30/20 0400  remdesivir 200 mg in sodium chloride 0.9% 250 mL IVPB       "Followed by" Linked Group Details   200 mg 580 mL/hr over 30 Minutes Intravenous Once 08/30/20 0306 08/30/20 0647       Time Spent in minutes  30   Lala Lund M.D on 09/11/2020 at 10:17 AM  To page go to [email or web address] - password [facility]  [facility] -  Office  [phone]     See all Orders from today for further details    Objective:   Vitals:   09/11/20 0145 09/11/20 0420 09/11/20 0755 09/11/20 0812  BP:  101/71  120/66  Pulse: 95 95 (!) 114 (!) 116  Resp: (!) 24 (!) 24 (!) 24 20  Temp:  98.1 F (36.7 C)  98.8 F (37.1 C)  TempSrc:  Axillary  Axillary  SpO2: 95% 100% 94% 92%  Weight:      Height:        Wt Readings from Last 3 Encounters:  09/07/20 108.5 kg  05/14/13 102.5 kg  07/15/12 95.3 kg     Intake/Output Summary (Last 24 hours) at 09/11/2020 1017 Last data filed at 09/10/2020 1800 Gross per 24 hour  Intake 432.76 ml  Output 300 ml  Net 132.76 ml     Physical Exam  Awake Alert, No new F.N deficits, Normal affect [place].AT,PERRAL Supple Neck,No JVD, No cervical lymphadenopathy appriciated.  Symmetrical Chest wall movement, Good air movement bilaterally, CTAB RRR,No Gallops, Rubs or new Murmurs, No Parasternal Heave +ve B.Sounds, Abd Soft, No tenderness, No organomegaly appriciated, No rebound - guarding or rigidity. No Cyanosis, Clubbing or edema, No new Rash or bruise      Data Review:    CBC Recent Labs  Lab 09/04/20 1206  09/05/20 0246 09/06/20 0729 09/09/20 0906 09/10/20 1056  WBC 28.3* 20.2* 22.9* 27.1* 21.7*  HGB 11.6* 11.6* 11.2* 10.9* 10.8*  HCT 37.6 38.4 37.8 35.6* 35.2*  PLT  419* 457* 504* 497* 444*  MCV 84.7 85.7 87.5 86.8 87.3  MCH 26.1 25.9* 25.9* 26.6 26.8  MCHC 30.9 30.2 29.6* 30.6 30.7  RDW 12.5 12.9 12.6 12.7 12.6  LYMPHSABS 1.1  --   --   --  0.5*  MONOABS 0.8  --   --   --  0.7  EOSABS 0.0  --   --   --  0.0  BASOSABS 0.0  --   --   --  0.0    Recent Labs  Lab 09/04/20 1206 09/04/20 1206 09/05/20 0246 09/05/20 0643 09/06/20 0729 09/07/20 1758 09/09/20 0906 09/10/20 1056  NA 136   < > 138  --  138 134* 137 131*  K 4.1   < > 4.6  --  4.6 4.9 4.0 3.8  CL 97*   < > 99  --  102 100 100 95*  CO2 24   < > 25  --  24 23 24 22   GLUCOSE 187*   < > 130*  --  86 227* 145* 286*  BUN 20   < > 20  --  21 15 17 22   CREATININE 1.04*   < > 0.96  --  1.00 0.94 0.88 0.86  CALCIUM 8.7*   < > 8.7*  --  8.9 8.7* 9.0 8.9  AST 43*  --  40  --  30 43*  --  61*  ALT 46*  --  44  --  39 49*  --  100*  ALKPHOS 90  --  76  --  69 78  --  71  BILITOT 0.7  --  0.9  --  0.9 0.7  --  0.7  ALBUMIN 2.8*  --  2.6*  --  2.5* 2.3*  --  2.3*  MG 2.3  --  2.6*  --   --  2.4 2.2 2.1  CRP 13.9*  --   --   --   --   --  8.0* 6.3*  DDIMER  --   --   --  5.32* 3.46*  --  14.76* 7.65*  PROCALCITON  --   --   --   --   --   --  <0.10 <0.10  TSH  --   --   --   --   --   --  1.705  --   BNP  --   --   --   --   --   --  34.0 18.3   < > = values in this interval not displayed.    ------------------------------------------------------------------------------------------------------------------ No results for input(s): CHOL, HDL, LDLCALC, TRIG, CHOLHDL, LDLDIRECT in the last 72 hours.  Lab Results  Component Value Date   HGBA1C 5.7 (H) 08/31/2020   ------------------------------------------------------------------------------------------------------------------ Recent Labs    09/09/20 0906  TSH 1.705     Cardiac Enzymes No results for input(s): CKMB, TROPONINI, MYOGLOBIN in the last 168 hours.  Invalid input(s): CK ------------------------------------------------------------------------------------------------------------------    Component Value Date/Time   BNP 18.3 09/10/2020 1056    Micro Results Recent Results (from the past 240 hour(s))  Culture, blood (single)     Status: None   Collection Time: 09/03/20  5:06 PM   Specimen: BLOOD LEFT HAND  Result Value Ref Range Status   Specimen Description BLOOD LEFT HAND  Final   Special Requests   Final    BOTTLES DRAWN AEROBIC AND ANAEROBIC Blood Culture adequate volume   Culture   Final  NO GROWTH 5 DAYS Performed at [facility], [place] [address] [place], [place] [address]    Report Status 09/08/2020 FINAL  Final  Expectorated sputum assessment w rflx to resp cult     Status: None   Collection Time: 09/09/20  1:52 PM   Specimen: Expectorated Sputum  Result Value Ref Range Status   Specimen Description EXPECTORATED SPUTUM  Final   Special Requests NONE  Final   Sputum evaluation   Final    Sputum specimen not acceptable for testing.  Please recollect.   Gram Stain Report Called to,Read Back By and Verified With: J. SCOTT RN, AT [phone] 09/11/20 BY Rush Landmark Performed at [facility], [place] [address], [place], [place] [address]    Report Status 09/11/2020 FINAL  Final    Radiology Reports CT ANGIO CHEST PE W OR WO CONTRAST  Result Date: 08/30/2020 CLINICAL DATA:  COVID-19 infection, now with cough, fever and shortness of breath for the past 2 days. Evaluate for pulmonary embolism. EXAM: CT ANGIOGRAPHY CHEST WITH CONTRAST TECHNIQUE: Multidetector CT imaging of the chest was performed using the standard protocol during bolus administration of intravenous contrast. Multiplanar CT image reconstructions and MIPs were obtained to evaluate the vascular anatomy. CONTRAST:  38mL OMNIPAQUE IOHEXOL 350 MG/ML SOLN COMPARISON:   Chest CT-08/29/2020; 01/12/2018; 05/24/2017 FINDINGS: Vascular Findings: There is suboptimal opacification of the pulmonary arterial system with the main pulmonary artery measuring only 195 Hounsfield units. Given this limitation, there are no discrete filling defects within the pulmonary arterial tree to the level the bilateral subsegmental pulmonary arteries. Evaluation of the distal subsegmental pulmonary arteries is degraded secondary to a combination of suboptimal vessel opacification as well as patient respiratory artifact. Normal caliber of the main pulmonary artery. Cardiomegaly.  No pericardial effusion. Mild ectasia of the ascending thoracic aorta measuring 38 mm in diameter. Conventional configuration of the aortic arch. The descending thoracic aorta is of normal caliber and widely patent without hemodynamically significant narrowing. There is a minimal amount of atherosclerotic plaque involving the aortic arch and proximal descending thoracic aorta, not resulting in hemodynamically significant stenosis. Review of the MIP images confirms the above findings. ---------------------------------------------------------------------------------- Nonvascular Findings: Mediastinum/Lymph Nodes: Scattered prominent mediastinal and hilar lymph nodes are not enlarged by size criteria with index AP window lymph node measuring 0.9 cm in greatest short axis diameter (image 33, series 5) and index left infrahilar lymph node measuring 0.8 cm (image 43, series 5), presumably reactive in etiology. No bulky mediastinal, hilar or axillary lymphadenopathy. Lungs/Pleura: Extensive bilateral heterogeneous and consolidative airspace opacities involving near the entirety of the lungs bilaterally with associated scattered air bronchograms. No pleural effusion or pneumothorax. The central pulmonary airways appear widely patent. Upper abdomen: Limited arterial phase of the upper abdomen demonstrates multiple cholesterol containing  gallstones within an otherwise normal-appearing gallbladder. Musculoskeletal: No acute or aggressive osseous abnormalities. Stigmata of dish within the thoracic spine. Regional soft tissues appear normal. Normal appearance of the thyroid gland. IMPRESSION: 1. No evidence of pulmonary embolism to the level the bilateral subsegmental pulmonary arteries. 2. Extensive heterogeneous and consolidative airspace opacities involving near the entirety of the lungs bilaterally compatible with provided history of COVID 19 pneumonia. 3. Cardiomegaly. 4. Aortic Atherosclerosis (ICD10-I70.0). 5. Cholelithiasis. Electronically Signed   By: Sandi Mariscal M.D.   On: 08/30/2020 09:00   DG Chest Port 1 View  Result Date: 09/09/2020 CLINICAL DATA:  COVID positive, shortness of breath EXAM: PORTABLE CHEST 1 VIEW COMPARISON:  09/03/2020 FINDINGS: Severe  diffuse bilateral airspace disease, worsening since prior study. Heart is borderline in size. No visible effusions or pneumothorax. IMPRESSION: Severe diffuse bilateral airspace disease, worsening since prior study. Electronically Signed   By: Rolm Baptise M.D.   On: 09/09/2020 09:21   DG CHEST PORT 1 VIEW  Result Date: 09/03/2020 CLINICAL DATA:  Adult respiratory distress syndrome. Reported COVID-19 positive EXAM: PORTABLE CHEST 1 VIEW COMPARISON:  August 29, 2020 chest radiograph and chest CT August 30, 2020 FINDINGS: There is increased airspace opacity throughout the lungs bilaterally with a more even distribution of airspace opacity compared to prior studies. No consolidation. Heart is mildly enlarged with pulmonary vascularity normal. No adenopathy. No bone lesions. IMPRESSION: Persistent widespread airspace opacity bilaterally without areas of consolidation. Appearance consistent with multifocal atypical organism pneumonia. A degree of underlying ARDS may be present. Stable cardiac prominence. No adenopathy evident by radiography. Electronically Signed   By: Lowella Grip III M.D.   On: 09/03/2020 07:59   DG Chest Port 1 View  Result Date: 08/29/2020 CLINICAL DATA:  COVID-19 positive, hypoxia, cough EXAM: PORTABLE CHEST 1 VIEW COMPARISON:  01/12/2018 FINDINGS: Single frontal view of the chest demonstrates unremarkable cardiac silhouette. There is widespread bilateral airspace disease, without effusion or pneumothorax. No acute bony abnormality. IMPRESSION: 1. Bilateral multifocal pneumonia compatible with COVID 19. Electronically Signed   By: Randa Ngo M.D.   On: 08/29/2020 22:54   VAS [place] LOWER EXTREMITY VENOUS (DVT)  Result Date: 09/03/2020  Lower Venous DVTStudy Indications: Edema.  Limitations: Patient positioning. Comparison Study: no prior Performing Technologist: Abram Sander RVS  Examination Guidelines: A complete evaluation includes B-mode imaging, spectral Doppler, color Doppler, and power Doppler as needed of all accessible portions of each vessel. Bilateral testing is considered an integral part of a complete examination. Limited examinations for reoccurring indications may be performed as noted. The reflux portion of the exam is performed with the patient in reverse Trendelenburg.  +---------+---------------+---------+-----------+----------+--------------+ RIGHT    CompressibilityPhasicitySpontaneityPropertiesThrombus Aging +---------+---------------+---------+-----------+----------+--------------+ CFV      Full           Yes      Yes                                 +---------+---------------+---------+-----------+----------+--------------+ SFJ      Full                                                        +---------+---------------+---------+-----------+----------+--------------+ FV Prox  Full                                                        +---------+---------------+---------+-----------+----------+--------------+ FV Mid   Full                                                         +---------+---------------+---------+-----------+----------+--------------+ FV DistalFull                                                        +---------+---------------+---------+-----------+----------+--------------+  PFV      Full                                                        +---------+---------------+---------+-----------+----------+--------------+ POP      Full           Yes      Yes                                 +---------+---------------+---------+-----------+----------+--------------+ PTV      Full                                                        +---------+---------------+---------+-----------+----------+--------------+ PERO     Full                                                        +---------+---------------+---------+-----------+----------+--------------+   +---------+---------------+---------+-----------+----------+--------------+ LEFT     CompressibilityPhasicitySpontaneityPropertiesThrombus Aging +---------+---------------+---------+-----------+----------+--------------+ CFV      Full           Yes      Yes                                 +---------+---------------+---------+-----------+----------+--------------+ SFJ      Full                                                        +---------+---------------+---------+-----------+----------+--------------+ FV Prox  Full                                                        +---------+---------------+---------+-----------+----------+--------------+ FV Mid   Full                                                        +---------+---------------+---------+-----------+----------+--------------+ FV DistalFull                                                        +---------+---------------+---------+-----------+----------+--------------+ PFV      Full                                                         +---------+---------------+---------+-----------+----------+--------------+  POP      Full           Yes      Yes                                 +---------+---------------+---------+-----------+----------+--------------+ PTV      Full                                                        +---------+---------------+---------+-----------+----------+--------------+ PERO                                                  Not visualized +---------+---------------+---------+-----------+----------+--------------+     Summary: BILATERAL: - No evidence of deep vein thrombosis seen in the lower extremities, bilaterally. - No evidence of superficial venous thrombosis in the lower extremities, bilaterally. -   *See table(s) above for measurements and observations. Electronically signed by Servando Snare MD on 09/03/2020 at 7:12:00 PM.    Final    ECHOCARDIOGRAM LIMITED  Result Date: 09/02/2020    ECHOCARDIOGRAM LIMITED REPORT   Patient Name:   MARKELA WEE Date of Exam: 09/01/2020 Medical Rec #:  [id number]           Height:       64.0 in Accession #:    [id number]          Weight:       255.3 lb Date of Birth:  [birth year]           BSA:          2.170 m Patient Age:    64 years            BP:           122/79 mmHg Patient Gender: F                   HR:           97 bpm. Exam Location:  Inpatient Procedure: 2D Echo                                 MODIFIED REPORT: This report was modified by Sanda Klein MD on 09/02/2020 due to Corrected IVC                                    statement.  Indications:     786.09 dyspnea  History:         Patient has no prior history of Echocardiogram examinations.                  Risk Factors:Former Smoker. Covid +.  Sonographer:     Jannett Celestine RDCS (AE) Referring Phys:  [id number] Lan[place] Clam Diagnosing Phys: Sanda Klein MD IMPRESSIONS  1. Left ventricular ejection fraction, by estimation, is 60 to 65%. The left ventricle has normal function. The left  ventricle has no regional wall motion abnormalities. Left ventricular diastolic parameters were normal.  2. Right ventricular systolic function is  normal. The right ventricular size is normal. There is severely elevated pulmonary artery systolic pressure.  3. The mitral valve is normal in structure. No evidence of mitral valve regurgitation. No evidence of mitral stenosis.  4. The aortic valve is normal in structure. Aortic valve regurgitation is not visualized. No aortic stenosis is present.  5. The inferior vena cava is dilated in size with <50% respiratory variability, suggesting right atrial pressure of 15 mmHg. FINDINGS  Left Ventricle: Left ventricular ejection fraction, by estimation, is 60 to 65%. The left ventricle has normal function. The left ventricle has no regional wall motion abnormalities. The left ventricular internal cavity size was normal in size. There is  no left ventricular hypertrophy. Left ventricular diastolic parameters were normal. Right Ventricle: The right ventricular size is normal. No increase in right ventricular wall thickness. Right ventricular systolic function is normal. There is severely elevated pulmonary artery systolic pressure. The tricuspid regurgitant velocity is 3.43 m/s, and with an assumed right atrial pressure of 15 mmHg, the estimated right ventricular systolic pressure is 65.0 mmHg. Left Atrium: Left atrial size was normal in size. Right Atrium: Right atrial size was normal in size. Pericardium: There is no evidence of pericardial effusion. Mitral Valve: The mitral valve is normal in structure. No evidence of mitral valve stenosis. Tricuspid Valve: The tricuspid valve is normal in structure. Tricuspid valve regurgitation is trivial. No evidence of tricuspid stenosis. Aortic Valve: The aortic valve is normal in structure. Aortic valve regurgitation is not visualized. No aortic stenosis is present. Pulmonic Valve: The pulmonic valve was normal in structure. Pulmonic  valve regurgitation is not visualized. No evidence of pulmonic stenosis. Aorta: The aortic root is normal in size and structure. Venous: The inferior vena cava is dilated in size with less than 50% respiratory variability, suggesting right atrial pressure of 15 mmHg. IAS/Shunts: No atrial level shunt detected by color flow Doppler. LEFT VENTRICLE PLAX 2D LVIDd:         4.25 cm Diastology LVIDs:         3.06 cm LV e' medial:    8.38 cm/s LV PW:         0.96 cm LV E/e' medial:  8.9 LV IVS:        0.93 cm LV e' lateral:   12.40 cm/s                        LV E/e' lateral: 6.0  MITRAL VALVE               TRICUSPID VALVE MV Area (PHT): 4.26 cm    TR Peak grad:   47.1 mmHg MV Decel Time: 178 msec    TR Vmax:        343.00 cm/s MV E velocity: 74.55 cm/s MV A velocity: 68.98 cm/s MV E/A ratio:  1.08 Mihai Croitoru MD Electronically signed by Sanda Klein MD Signature Date/Time: 09/01/2020/3:40:50 PM    Final (Updated)

## 2020-09-11 NOTE — Progress Notes (Signed)
PT Cancellation Note  Patient Details Name: Wendy Ruiz MRN: [id number] DOB: 04-23-1956   Cancelled Treatment:    Reason Eval/Treat Not Completed: Patient declined, no reason specifiedadamantly refuses PT today despite education on benefits of mobility being provided; reports she just can't do anything today because it was a busy morning so far. Unable to convince her to participate. Will attempt to return if time/schedule allow.    Windell Norfolk, DPT, PN1   Supplemental Physical Therapist [facility]    Pager [phone] Acute Rehab Office [phone]

## 2020-09-12 ENCOUNTER — Inpatient Hospital Stay (HOSPITAL_COMMUNITY): Payer: HRSA Program

## 2020-09-12 DIAGNOSIS — R7989 Other specified abnormal findings of blood chemistry: Secondary | ICD-10-CM

## 2020-09-12 DIAGNOSIS — U071 COVID-19: Secondary | ICD-10-CM | POA: Diagnosis not present

## 2020-09-12 DIAGNOSIS — J9601 Acute respiratory failure with hypoxia: Secondary | ICD-10-CM | POA: Diagnosis not present

## 2020-09-12 DIAGNOSIS — J80 Acute respiratory distress syndrome: Secondary | ICD-10-CM | POA: Diagnosis not present

## 2020-09-12 LAB — CBC WITH DIFFERENTIAL/PLATELET
Abs Immature Granulocytes: 0.14 10*3/uL — ABNORMAL HIGH (ref 0.00–0.07)
Basophils Absolute: 0 10*3/uL (ref 0.0–0.1)
Basophils Relative: 0 %
Eosinophils Absolute: 0 10*3/uL (ref 0.0–0.5)
Eosinophils Relative: 0 %
HCT: 32.1 % — ABNORMAL LOW (ref 36.0–46.0)
Hemoglobin: 9.8 g/dL — ABNORMAL LOW (ref 12.0–15.0)
Immature Granulocytes: 1 %
Lymphocytes Relative: 10 %
Lymphs Abs: 1.7 10*3/uL (ref 0.7–4.0)
MCH: 26.7 pg (ref 26.0–34.0)
MCHC: 30.5 g/dL (ref 30.0–36.0)
MCV: 87.5 fL (ref 80.0–100.0)
Monocytes Absolute: 0.9 10*3/uL (ref 0.1–1.0)
Monocytes Relative: 5 %
Neutro Abs: 13.9 10*3/uL — ABNORMAL HIGH (ref 1.7–7.7)
Neutrophils Relative %: 84 %
Platelets: 388 10*3/uL (ref 150–400)
RBC: 3.67 MIL/uL — ABNORMAL LOW (ref 3.87–5.11)
RDW: 12.8 % (ref 11.5–15.5)
WBC: 16.6 10*3/uL — ABNORMAL HIGH (ref 4.0–10.5)
nRBC: 0 % (ref 0.0–0.2)

## 2020-09-12 LAB — COMPREHENSIVE METABOLIC PANEL
ALT: 157 U/L — ABNORMAL HIGH (ref 0–44)
AST: 82 U/L — ABNORMAL HIGH (ref 15–41)
Albumin: 2.2 g/dL — ABNORMAL LOW (ref 3.5–5.0)
Alkaline Phosphatase: 61 U/L (ref 38–126)
Anion gap: 11 (ref 5–15)
BUN: 16 mg/dL (ref 8–23)
CO2: 25 mmol/L (ref 22–32)
Calcium: 8.8 mg/dL — ABNORMAL LOW (ref 8.9–10.3)
Chloride: 102 mmol/L (ref 98–111)
Creatinine, Ser: 0.75 mg/dL (ref 0.44–1.00)
GFR calc Af Amer: >60 mL/min (ref >60)
GFR calc non Af Amer: >60 mL/min (ref >60)
Glucose, Bld: 78 mg/dL (ref 70–99)
Potassium: 3.8 mmol/L (ref 3.5–5.1)
Sodium: 138 mmol/L (ref 135–145)
Total Bilirubin: 0.6 mg/dL (ref 0.3–1.2)
Total Protein: 6.1 g/dL — ABNORMAL LOW (ref 6.5–8.1)

## 2020-09-12 LAB — GLUCOSE, CAPILLARY
Glucose-Capillary: 132 mg/dL — ABNORMAL HIGH (ref 70–99)
Glucose-Capillary: 208 mg/dL — ABNORMAL HIGH (ref 70–99)
Glucose-Capillary: 87 mg/dL (ref 70–99)
Glucose-Capillary: 90 mg/dL (ref 70–99)
Glucose-Capillary: 99 mg/dL (ref 70–99)

## 2020-09-12 LAB — PROCALCITONIN: Procalcitonin: <0.1 ng/mL

## 2020-09-12 LAB — C-REACTIVE PROTEIN: CRP: 3.8 mg/dL — ABNORMAL HIGH (ref <1.0)

## 2020-09-12 LAB — MAGNESIUM: Magnesium: 2.2 mg/dL (ref 1.7–2.4)

## 2020-09-12 LAB — D-DIMER, QUANTITATIVE: D-Dimer, Quant: 4.55 ug/mL-FEU — ABNORMAL HIGH (ref 0.00–0.50)

## 2020-09-12 LAB — BRAIN NATRIURETIC PEPTIDE: B Natriuretic Peptide: 20.5 pg/mL (ref 0.0–100.0)

## 2020-09-12 MED ORDER — LACTATED RINGERS IV SOLN: INTRAVENOUS | Status: AC

## 2020-09-12 NOTE — Progress Notes (Signed)
VASCULAR LAB    Bilateral lower extremity venous duplex has been performed.  See CV proc for preliminary results.   Carlyann Placide, RVT 09/12/2020, 1:47 PM

## 2020-09-12 NOTE — Progress Notes (Signed)
   09/12/20 0919  Assess: MEWS Score  Temp 100.2 F (37.9 C)  BP (!) 104/44  Pulse Rate (!) 113  ECG Heart Rate (!) 119  Resp (!) 29  SpO2 90 %  Assess: MEWS Score  MEWS Temp 0  MEWS Systolic 0  MEWS Pulse 2  MEWS RR 2  MEWS LOC 0  MEWS Score 4  MEWS Score Color Red  Assess: if the MEWS score is Yellow or Red  Were vital signs taken at a resting state? Yes  Focused Assessment No change from prior assessment  Early Detection of Sepsis Score *See Row Information* Medium  MEWS guidelines implemented *See Row Information* Yes  Treat  MEWS Interventions Administered scheduled meds/treatments;Administered prn meds/treatments  Pain Scale 0-10  Pain Score 0  Take Vital Signs  Increase Vital Sign Frequency  Red: Q 1hr X 4 then Q 4hr X 4, if remains red, continue Q 4hrs  Escalate  MEWS: Escalate Red: discuss with charge nurse/RN and provider, consider discussing with RRT  Notify: Charge Nurse/RN  Name of Charge Nurse/RN Notified Elisa F  Date Charge Nurse/RN Notified 09/12/20  Time Charge Nurse/RN Notified 0930  Notify: Provider  Provider Name/Title Dr. Candiss Norse  Date Provider Notified 09/12/20  Time Provider Notified [phone]  Notification Type Page  Notification Reason Other (Comment) (MEWS score red, per protocol)  Response No new orders  Date of Provider Response 09/12/20  Time of Provider Response [phone]

## 2020-09-12 NOTE — Progress Notes (Signed)
PROGRESS NOTE                                                                                                                                                                                                             Patient Demographics:    Wendy Ruiz, is a 64 y.o. female, DOB - 1956-11-18, NKN:[id number]  Outpatient Primary MD for the patient is Lyndee Hensen, DO    LOS - 77  Admit date - 08/29/2020    Chief Complaint  Patient presents with   Shortness of Breath       Brief Narrative  - 64 year old African-American female with past medical history of bipolar disorder, obesity, GERD who was admitted to the hospital on 08/29/2020 with severe acute hypoxic respiratory failure due to COVID-19 pneumonia, unfortunately patient is not vaccinated.  She was under the care of ICU and was BiPAP dependent for a while.  She has been transferred to hospitalist service on 09/09/2020.  Currently on 50 L of heated high flow at 100% FiO2 along with nonrebreather mask.   Subjective:   Patient in bed, appears comfortable, denies any headache, no fever, no chest pain or pressure, improved shortness of breath , no abdominal pain. No focal weakness.    Assessment  & Plan :     1. Acute Hypoxic Resp. Failure due to Acute Covid 19 Viral Pneumonitis during the ongoing 2020 Covid 19 Pandemic - she is unfortunately not vaccinated and has incurred severe parenchymal lung injury, she is being treated with combination of remdesivir, IV steroids and Baricitinib.  Was under the care of ICU and transferred to hospitalist service on 09/09/2020, she is currently on 25 L heated high flow and 100% FiO2 plus nonrebreather (down from 50+NRB).  Due to worsening leukocytosis and oxygen demands she has been placed on 5 days of oral Levaquin starting on 10/07/2020 with some improvement, continue to monitor, remains extremely guarded.  Encouraged the patient  to sit up in chair in the daytime use I-S and flutter valve for pulmonary toiletry and then prone in bed when at night.  Will advance activity and titrate down oxygen as possible.   SpO2: 98 % O2 Flow Rate (L/min): 25 L/min FiO2 (%): 80 %  Recent Labs  Lab 09/06/20 0729 09/07/20 1758 09/09/20 0906 09/10/20 1056 09/11/20 1116 09/12/20 0351  WBC 22.9*  --  27.1* 21.7* 25.6* 16.6*  PLT 504*  --  497* 444* 455* 388  CRP  --   --  8.0* 6.3* 3.9* 3.8*  BNP  --   --  34.0 18.3 51.9 20.5  DDIMER 3.46*  --  14.76* 7.65* 7.18* 4.55*  PROCALCITON  --   --  <0.10 <0.10 <0.10 <0.10  AST 30 43*  --  61* 82* 82*  ALT 39 49*  --  100* 143* 157*  ALKPHOS 69 78  --  71 68 61  BILITOT 0.9 0.7  --  0.7 0.6 0.6  ALBUMIN 2.5* 2.3*  --  2.3* 2.5* 2.2*     2.  Extremely elevated D-dimer.  Full dose Lovenox.  Initial CTA and lower extremity leg ultrasound unremarkable but this was around the time of admission, echo shows increased RV systolic pressure, continue full dose Lovenox, repeat lower extremity venous duplex, once clinically stable repeat CTA.  For now continue full dose anticoagulation with improving D dimer.   3.  Intermediate quanteferon checked in ICU.  Outpatient monitoring.  CTA does not show any signs of active tuberculosis, patient follow-up with ID post discharge.  4.  GERD.  PPI.  6.  Mild tachycardia.  Stable TSH, CTA negative few weeks ago, no pleuritic chest pain and hypoxia is actually improving, repeat leg ultrasound pending, hydrate, monitor closely.      Condition - Extremely Guarded  Family Communication  :   Daughter [name] [phone] on 09/09/2020 updated, message left on 09/10/2020 at 11:19 AM, message left 09/11/2020 at 8:05 AM  Daughter [name] [phone] on 09/09/20 updated,    Code Status : Full  Consults  :  PCCM  Procedures  :    CTA 9/19 with no PE, near confluent dense groundglass opacities in all lobes of bilateral lungs 09/02/2019 one 2D echo  essentially unremarkable  923 lower extremity Doppler studies-> neg 9/30 - repeat duplex  - pending    PUD Prophylaxis : PPI  Disposition Plan  :    Status is: Inpatient  Remains inpatient appropriate because:IV treatments appropriate due to intensity of illness or inability to take PO   Dispo: The patient is from: Home              Anticipated d/c is to: Home              Anticipated d/c date is: > 3 days              Patient currently is not medically stable to d/c.   DVT Prophylaxis  :  Lovenox   Lab Results  Component Value Date   PLT 388 09/12/2020    Diet :  Diet Order            Diet Carb Modified Fluid consistency: Thin; Room service appropriate? Yes  Diet effective now                  Inpatient Medications  Scheduled Meds:  Chlorhexidine Gluconate Cloth  6 each Topical Daily   enoxaparin (LOVENOX) injection  110 mg Subcutaneous Q12H   feeding supplement (ENSURE ENLIVE)  237 mL Oral TID   insulin aspart  0-15 Units Subcutaneous Q4H   insulin detemir  10 Units Subcutaneous BID   mouth rinse  15 mL Mouth Rinse BID   methylPREDNISolone (SOLU-MEDROL) injection  60 mg Intravenous Daily   metoprolol tartrate  25 mg Oral BID   pantoprazole  40 mg Oral Q1200   polyethylene glycol  17 g Oral BID   sodium chloride flush  10-40 mL Intracatheter Q12H   Continuous Infusions:  sodium chloride 10 mL/hr at 09/07/20 2000   PRN Meds:.sodium chloride, acetaminophen, guaiFENesin-dextromethorphan, lip balm, naphazoline-glycerin, phenol, sodium chloride, sodium chloride flush  Antibiotics  :    Anti-infectives (From admission, onward)   Start     Dose/Rate Route Frequency Ordered Stop   09/11/20 1100  levofloxacin (LEVAQUIN) tablet 750 mg        750 mg Oral Daily 09/11/20 1024 09/12/20 0941   09/09/20 1415  levofloxacin (LEVAQUIN) tablet 750 mg        750 mg Oral Daily 09/09/20 1351 09/11/20 0907   09/03/20 1115  ceFEPIme (MAXIPIME) 2 g in sodium  chloride 0.9 % 100 mL IVPB  Status:  Discontinued        2 g 200 mL/hr over 30 Minutes Intravenous Every 8 hours 09/03/20 1104 09/08/20 1140   09/03/20 1100  azithromycin (ZITHROMAX) 500 mg in sodium chloride 0.9 % 250 mL IVPB  Status:  Discontinued        500 mg 250 mL/hr over 60 Minutes Intravenous Every 24 hours 09/03/20 1052 09/04/20 0854   08/31/20 1000  remdesivir 100 mg in sodium chloride 0.9 % 100 mL IVPB       "Followed by" Linked Group Details   100 mg 200 mL/hr over 30 Minutes Intravenous Daily 08/30/20 0306 09/03/20 1000   08/30/20 0400  remdesivir 200 mg in sodium chloride 0.9% 250 mL IVPB       "Followed by" Linked Group Details   200 mg 580 mL/hr over 30 Minutes Intravenous Once 08/30/20 0306 08/30/20 0647       Time Spent in minutes  30   Lala Lund M.D on 09/12/2020 at 11:36 AM  To page go to [email or web address] - password [facility]  [facility] -  Office  [phone]     See all Orders from today for further details    Objective:   Vitals:   09/12/20 0907 09/12/20 0919 09/12/20 1052 09/12/20 1129  BP: 115/70 (!) 104/44 101/60 104/67  Pulse: (!) 115 (!) 113 100 100  Resp: (!) 34 (!) 29 (!) 27 (!) 27  Temp:  100.2 F (37.9 C) 100.2 F (37.9 C) 99.6 F (37.6 C)  TempSrc:  Axillary Axillary Axillary  SpO2: 94% 90% 98% 98%  Weight:      Height:        Wt Readings from Last 3 Encounters:  09/07/20 108.5 kg  05/14/13 102.5 kg  07/15/12 95.3 kg     Intake/Output Summary (Last 24 hours) at 09/12/2020 1136 Last data filed at 09/12/2020 0900 Gross per 24 hour  Intake 281.25 ml  Output 800 ml  Net -518.75 ml     Physical Exam  Awake Alert, No new F.N deficits, Normal affect [place].AT,PERRAL Supple Neck,No JVD, No cervical lymphadenopathy appriciated.  Symmetrical Chest wall movement, Good air movement bilaterally, few rales RRR,No Gallops, Rubs or new Murmurs, No Parasternal Heave +ve B.Sounds, Abd Soft, No tenderness, No organomegaly  appriciated, No rebound - guarding or rigidity. No Cyanosis, Clubbing or edema, No new Rash or bruise    Data Review:    CBC Recent Labs  Lab 09/06/20 0729 09/09/20 0906 09/10/20 1056 09/11/20 1116 09/12/20 0351  WBC 22.9* 27.1* 21.7* 25.6* 16.6*  HGB 11.2* 10.9* 10.8* 10.8* 9.8*  HCT 37.8 35.6* 35.2* 35.5* 32.1*  PLT 504* 497* 444* 455* 388  MCV 87.5 86.8 87.3 86.8 87.5  MCH 25.9* 26.6 26.8 26.4 26.7  MCHC 29.6* 30.6 30.7 30.4 30.5  RDW 12.6 12.7 12.6 12.7 12.8  LYMPHSABS  --   --  0.5* 0.3* 1.7  MONOABS  --   --  0.7 0.8 0.9  EOSABS  --   --  0.0 0.0 0.0  BASOSABS  --   --  0.0 0.0 0.0    Recent Labs  Lab 09/06/20 0729 09/06/20 0729 09/07/20 1758 09/09/20 0906 09/10/20 1056 09/11/20 1116 09/12/20 0351  NA 138   < > 134* 137 131* 136 138  K 4.6   < > 4.9 4.0 3.8 4.0 3.8  CL 102   < > 100 100 95* 98 102  CO2 24   < > 23 24 22 26 25   GLUCOSE 86   < > 227* 145* 286* 174* 78  BUN 21   < > 15 17 22 20 16   CREATININE 1.00   < > 0.94 0.88 0.86 0.85 0.75  CALCIUM 8.9   < > 8.7* 9.0 8.9 9.0 8.8*  AST 30  --  43*  --  61* 82* 82*  ALT 39  --  49*  --  100* 143* 157*  ALKPHOS 69  --  78  --  71 68 61  BILITOT 0.9  --  0.7  --  0.7 0.6 0.6  ALBUMIN 2.5*  --  2.3*  --  2.3* 2.5* 2.2*  MG  --   --  2.4 2.2 2.1 2.2 2.2  CRP  --   --   --  8.0* 6.3* 3.9* 3.8*  DDIMER 3.46*  --   --  14.76* 7.65* 7.18* 4.55*  PROCALCITON  --   --   --  <0.10 <0.10 <0.10 <0.10  TSH  --   --   --  1.705  --   --   --   BNP  --   --   --  34.0 18.3 51.9 20.5   < > = values in this interval not displayed.    ------------------------------------------------------------------------------------------------------------------ No results for input(s): CHOL, HDL, LDLCALC, TRIG, CHOLHDL, LDLDIRECT in the last 72 hours.  Lab Results  Component Value Date   HGBA1C 5.7 (H) 08/31/2020    ------------------------------------------------------------------------------------------------------------------ No results for input(s): TSH, T4TOTAL, T3FREE, THYROIDAB in the last 72 hours.  Invalid input(s): FREET3  Cardiac Enzymes No results for input(s): CKMB, TROPONINI, MYOGLOBIN in the last 168 hours.  Invalid input(s): CK ------------------------------------------------------------------------------------------------------------------    Component Value Date/Time   BNP 20.5 09/12/2020 0351    Micro Results Recent Results (from the past 240 hour(s))  Culture, blood (single)     Status: None   Collection Time: 09/03/20  5:06 PM   Specimen: BLOOD LEFT HAND  Result Value Ref Range Status   Specimen Description BLOOD LEFT HAND  Final   Special Requests   Final    BOTTLES DRAWN AEROBIC AND ANAEROBIC Blood Culture adequate volume   Culture   Final    NO GROWTH 5 DAYS Performed at [facility], [address] [place], [place] [address]    Report Status 09/08/2020 FINAL  Final  Expectorated sputum assessment w rflx to resp cult     Status: None   Collection Time: 09/09/20  1:52 PM   Specimen: Expectorated Sputum  Result Value Ref Range Status   Specimen Description EXPECTORATED SPUTUM  Final   Special Requests NONE  Final   Sputum evaluation   Final    Sputum specimen  not acceptable for testing.  Please recollect.   Gram Stain Report Called to,Read Back By and Verified With: J. SCOTT RN, AT [phone] 09/11/20 BY Rush Landmark Performed at [facility], [place] [address], [place], [place] [address]    Report Status 09/11/2020 FINAL  Final    Radiology Reports CT ANGIO CHEST PE W OR WO CONTRAST  Result Date: 08/30/2020 CLINICAL DATA:  COVID-19 infection, now with cough, fever and shortness of breath for the past 2 days. Evaluate for pulmonary embolism. EXAM: CT ANGIOGRAPHY CHEST WITH CONTRAST TECHNIQUE: Multidetector CT imaging of the chest was performed using the  standard protocol during bolus administration of intravenous contrast. Multiplanar CT image reconstructions and MIPs were obtained to evaluate the vascular anatomy. CONTRAST:  33mL OMNIPAQUE IOHEXOL 350 MG/ML SOLN COMPARISON:  Chest CT-08/29/2020; 01/12/2018; 05/24/2017 FINDINGS: Vascular Findings: There is suboptimal opacification of the pulmonary arterial system with the main pulmonary artery measuring only 195 Hounsfield units. Given this limitation, there are no discrete filling defects within the pulmonary arterial tree to the level the bilateral subsegmental pulmonary arteries. Evaluation of the distal subsegmental pulmonary arteries is degraded secondary to a combination of suboptimal vessel opacification as well as patient respiratory artifact. Normal caliber of the main pulmonary artery. Cardiomegaly.  No pericardial effusion. Mild ectasia of the ascending thoracic aorta measuring 38 mm in diameter. Conventional configuration of the aortic arch. The descending thoracic aorta is of normal caliber and widely patent without hemodynamically significant narrowing. There is a minimal amount of atherosclerotic plaque involving the aortic arch and proximal descending thoracic aorta, not resulting in hemodynamically significant stenosis. Review of the MIP images confirms the above findings. ---------------------------------------------------------------------------------- Nonvascular Findings: Mediastinum/Lymph Nodes: Scattered prominent mediastinal and hilar lymph nodes are not enlarged by size criteria with index AP window lymph node measuring 0.9 cm in greatest short axis diameter (image 33, series 5) and index left infrahilar lymph node measuring 0.8 cm (image 43, series 5), presumably reactive in etiology. No bulky mediastinal, hilar or axillary lymphadenopathy. Lungs/Pleura: Extensive bilateral heterogeneous and consolidative airspace opacities involving near the entirety of the lungs bilaterally with  associated scattered air bronchograms. No pleural effusion or pneumothorax. The central pulmonary airways appear widely patent. Upper abdomen: Limited arterial phase of the upper abdomen demonstrates multiple cholesterol containing gallstones within an otherwise normal-appearing gallbladder. Musculoskeletal: No acute or aggressive osseous abnormalities. Stigmata of dish within the thoracic spine. Regional soft tissues appear normal. Normal appearance of the thyroid gland. IMPRESSION: 1. No evidence of pulmonary embolism to the level the bilateral subsegmental pulmonary arteries. 2. Extensive heterogeneous and consolidative airspace opacities involving near the entirety of the lungs bilaterally compatible with provided history of COVID 19 pneumonia. 3. Cardiomegaly. 4. Aortic Atherosclerosis (ICD10-I70.0). 5. Cholelithiasis. Electronically Signed   By: Sandi Mariscal M.D.   On: 08/30/2020 09:00   DG Chest Port 1 View  Result Date: 09/09/2020 CLINICAL DATA:  COVID positive, shortness of breath EXAM: PORTABLE CHEST 1 VIEW COMPARISON:  09/03/2020 FINDINGS: Severe diffuse bilateral airspace disease, worsening since prior study. Heart is borderline in size. No visible effusions or pneumothorax. IMPRESSION: Severe diffuse bilateral airspace disease, worsening since prior study. Electronically Signed   By: Rolm Baptise M.D.   On: 09/09/2020 09:21   DG CHEST PORT 1 VIEW  Result Date: 09/03/2020 CLINICAL DATA:  Adult respiratory distress syndrome. Reported COVID-19 positive EXAM: PORTABLE CHEST 1 VIEW COMPARISON:  August 29, 2020 chest radiograph and chest CT August 30, 2020 FINDINGS: There is increased airspace opacity  throughout the lungs bilaterally with a more even distribution of airspace opacity compared to prior studies. No consolidation. Heart is mildly enlarged with pulmonary vascularity normal. No adenopathy. No bone lesions. IMPRESSION: Persistent widespread airspace opacity bilaterally without areas of  consolidation. Appearance consistent with multifocal atypical organism pneumonia. A degree of underlying ARDS may be present. Stable cardiac prominence. No adenopathy evident by radiography. Electronically Signed   By: Lowella Grip III M.D.   On: 09/03/2020 07:59   DG Chest Port 1 View  Result Date: 08/29/2020 CLINICAL DATA:  COVID-19 positive, hypoxia, cough EXAM: PORTABLE CHEST 1 VIEW COMPARISON:  01/12/2018 FINDINGS: Single frontal view of the chest demonstrates unremarkable cardiac silhouette. There is widespread bilateral airspace disease, without effusion or pneumothorax. No acute bony abnormality. IMPRESSION: 1. Bilateral multifocal pneumonia compatible with COVID 19. Electronically Signed   By: Randa Ngo M.D.   On: 08/29/2020 22:54   VAS [place] LOWER EXTREMITY VENOUS (DVT)  Result Date: 09/03/2020  Lower Venous DVTStudy Indications: Edema.  Limitations: Patient positioning. Comparison Study: no prior Performing Technologist: Abram Sander RVS  Examination Guidelines: A complete evaluation includes B-mode imaging, spectral Doppler, color Doppler, and power Doppler as needed of all accessible portions of each vessel. Bilateral testing is considered an integral part of a complete examination. Limited examinations for reoccurring indications may be performed as noted. The reflux portion of the exam is performed with the patient in reverse Trendelenburg.  +---------+---------------+---------+-----------+----------+--------------+  RIGHT     Compressibility Phasicity Spontaneity Properties Thrombus Aging  +---------+---------------+---------+-----------+----------+--------------+  CFV       Full            Yes       Yes                                    +---------+---------------+---------+-----------+----------+--------------+  SFJ       Full                                                             +---------+---------------+---------+-----------+----------+--------------+  FV Prox   Full                                                              +---------+---------------+---------+-----------+----------+--------------+  FV Mid    Full                                                             +---------+---------------+---------+-----------+----------+--------------+  FV Distal Full                                                             +---------+---------------+---------+-----------+----------+--------------+  PFV  Full                                                             +---------+---------------+---------+-----------+----------+--------------+  POP       Full            Yes       Yes                                    +---------+---------------+---------+-----------+----------+--------------+  PTV       Full                                                             +---------+---------------+---------+-----------+----------+--------------+  PERO      Full                                                             +---------+---------------+---------+-----------+----------+--------------+   +---------+---------------+---------+-----------+----------+--------------+  LEFT      Compressibility Phasicity Spontaneity Properties Thrombus Aging  +---------+---------------+---------+-----------+----------+--------------+  CFV       Full            Yes       Yes                                    +---------+---------------+---------+-----------+----------+--------------+  SFJ       Full                                                             +---------+---------------+---------+-----------+----------+--------------+  FV Prox   Full                                                             +---------+---------------+---------+-----------+----------+--------------+  FV Mid    Full                                                             +---------+---------------+---------+-----------+----------+--------------+  FV Distal Full                                                              +---------+---------------+---------+-----------+----------+--------------+  PFV       Full                                                             +---------+---------------+---------+-----------+----------+--------------+  POP       Full            Yes       Yes                                    +---------+---------------+---------+-----------+----------+--------------+  PTV       Full                                                             +---------+---------------+---------+-----------+----------+--------------+  PERO                                                       Not visualized  +---------+---------------+---------+-----------+----------+--------------+     Summary: BILATERAL: - No evidence of deep vein thrombosis seen in the lower extremities, bilaterally. - No evidence of superficial venous thrombosis in the lower extremities, bilaterally. -   *See table(s) above for measurements and observations. Electronically signed by Servando Snare MD on 09/03/2020 at 7:12:00 PM.    Final    ECHOCARDIOGRAM LIMITED  Result Date: 09/02/2020    ECHOCARDIOGRAM LIMITED REPORT   Patient Name:   BRITT THEARD Date of Exam: 09/01/2020 Medical Rec #:  [id number]           Height:       64.0 in Accession #:    [id number]          Weight:       255.3 lb Date of Birth:  Apr 29, 1956           BSA:          2.170 m Patient Age:    87 years            BP:           122/79 mmHg Patient Gender: F                   HR:           97 bpm. Exam Location:  Inpatient Procedure: 2D Echo                                 MODIFIED REPORT: This report was modified by Sanda Klein MD on 09/02/2020 due to Corrected IVC                                    statement.  Indications:     786.09 dyspnea  History:         Patient has no prior  history of Echocardiogram examinations.                  Risk Factors:Former Smoker. Covid +.  Sonographer:     Jannett Celestine RDCS (AE) Referring Phys:  [id number] [place] Clam Diagnosing  Phys: Sanda Klein MD IMPRESSIONS  1. Left ventricular ejection fraction, by estimation, is 60 to 65%. The left ventricle has normal function. The left ventricle has no regional wall motion abnormalities. Left ventricular diastolic parameters were normal.  2. Right ventricular systolic function is normal. The right ventricular size is normal. There is severely elevated pulmonary artery systolic pressure.  3. The mitral valve is normal in structure. No evidence of mitral valve regurgitation. No evidence of mitral stenosis.  4. The aortic valve is normal in structure. Aortic valve regurgitation is not visualized. No aortic stenosis is present.  5. The inferior vena cava is dilated in size with <50% respiratory variability, suggesting right atrial pressure of 15 mmHg. FINDINGS  Left Ventricle: Left ventricular ejection fraction, by estimation, is 60 to 65%. The left ventricle has normal function. The left ventricle has no regional wall motion abnormalities. The left ventricular internal cavity size was normal in size. There is  no left ventricular hypertrophy. Left ventricular diastolic parameters were normal. Right Ventricle: The right ventricular size is normal. No increase in right ventricular wall thickness. Right ventricular systolic function is normal. There is severely elevated pulmonary artery systolic pressure. The tricuspid regurgitant velocity is 3.43 m/s, and with an assumed right atrial pressure of 15 mmHg, the estimated right ventricular systolic pressure is 52.8 mmHg. Left Atrium: Left atrial size was normal in size. Right Atrium: Right atrial size was normal in size. Pericardium: There is no evidence of pericardial effusion. Mitral Valve: The mitral valve is normal in structure. No evidence of mitral valve stenosis. Tricuspid Valve: The tricuspid valve is normal in structure. Tricuspid valve regurgitation is trivial. No evidence of tricuspid stenosis. Aortic Valve: The aortic valve is normal in  structure. Aortic valve regurgitation is not visualized. No aortic stenosis is present. Pulmonic Valve: The pulmonic valve was normal in structure. Pulmonic valve regurgitation is not visualized. No evidence of pulmonic stenosis. Aorta: The aortic root is normal in size and structure. Venous: The inferior vena cava is dilated in size with less than 50% respiratory variability, suggesting right atrial pressure of 15 mmHg. IAS/Shunts: No atrial level shunt detected by color flow Doppler. LEFT VENTRICLE PLAX 2D LVIDd:         4.25 cm Diastology LVIDs:         3.06 cm LV e' medial:    8.38 cm/s LV PW:         0.96 cm LV E/e' medial:  8.9 LV IVS:        0.93 cm LV e' lateral:   12.40 cm/s                        LV E/e' lateral: 6.0  MITRAL VALVE               TRICUSPID VALVE MV Area (PHT): 4.26 cm    TR Peak grad:   47.1 mmHg MV Decel Time: 178 msec    TR Vmax:        343.00 cm/s MV E velocity: 74.55 cm/s MV A velocity: 68.98 cm/s MV E/A ratio:  1.08 Mihai Croitoru MD Electronically signed by Sanda Klein MD Signature Date/Time: 09/01/2020/3:40:50 PM    Final (Updated)

## 2020-09-12 NOTE — Progress Notes (Signed)
Physical Therapy Treatment Patient Details Name: Wendy Ruiz MRN: [id number] DOB: 1956/10/17 Today's Date: 09/12/2020    History of Present Illness Pt is a 64 y.o. female admitted 08/29/20 not feeling well, apparently had syncopal episode and woke up to EMS present for hospital transport. CXR with bilateral infiltrates; pt hypoxic requiring NRB. PMH includes Bipolar 1, chronic pain, obesity.    PT Comments    Patient with dyspnea (RR 38) and sats 87% on HHFNC+NRB on arrival--after transfer from [facility] to bed/supine with nurse tech. Overall pt remains limited by need for high levels of O2 (25L, FiO2 80%+NRB 15L), easily fatigues, and anxiety. She did respond well to education (best positioning--upright during the day; use of incentive spirometer, pursed lip breathing). She transferred to EOB and to recliner with min-guard assist for lines and cues for pursed lip breathing with sats down to 90%. In standing, her sats incr to 97% however she fatigues and could only tolerate 45 seconds. Patient lives alone and may need to consider CIR if she is hospitalized 21 days.     Follow Up Recommendations  Home health PT;Supervision for mobility/OOB (may need to consider CIR; 21st day =10/9)     Equipment Recommendations   (TBD)    Recommendations for Other Services       Precautions / Restrictions Precautions Precautions: Fall;Other (comment) Precaution Comments: Watch SpO2, RR    Mobility  Bed Mobility Overal bed mobility: Modified Independent             General bed mobility comments: supine to sit with HOB 20 and rail  Transfers Overall transfer level: Needs assistance Equipment used: None Transfers: Sit to/from Stand Sit to Stand: Min guard         General transfer comment: Min GUard A for safety/multiple lines/HHFNC+NRB  Ambulation/Gait Ambulation/Gait assistance: Min guard Gait Distance (Feet): 2 Feet Assistive device: None Gait Pattern/deviations: Step-to  pattern;Trunk flexed     General Gait Details: Steps to recliner with min guard for balance, pt reaching to arm rest for added stability; after seated rest, sit to stand again   [employer] Mobility    Modified Rankin (Stroke Patients Only)       Balance Overall balance assessment: Needs assistance   Sitting balance-Leahy Scale: Good       Standing balance-Leahy Scale: Fair Standing balance comment: Requiring Min A for balance during dynamic movement                            Cognition Arousal/Alertness: Awake/alert Behavior During Therapy: Anxious Overall Cognitive Status: Within Functional Limits for tasks assessed                                 General Comments: initially very anxious (lying supine in bed, HOB 20, RR 38) after transfer [facility] to bed with NT. With cues, able to calm down, but took time      Exercises Other Exercises Other Exercises: Purse lip breathing; IS x 3 reps (max 750 when correct speed of breath)    General Comments General comments (skin integrity, edema, etc.): Pt on HHFNC 25L/FiO2 80% plus NRB. Sats on arrival 87% with pt supine after return to bed from [facility] with NT. Able to help pt slow RR from 38 to 28 with pursed lip breathing and guidance for  rate. She likes to talk and could not get RR lower. Once seated upright, sats 91%, standing incr to 97%; end of session 93% in recliner      Pertinent Vitals/Pain Pain Assessment: Faces Faces Pain Scale: No hurt    Home Living                      Prior Function            PT Goals (current goals can now be found in the care plan section) Acute Rehab PT Goals Patient Stated Goal: "I know it's going to be a process, but I hope to get through this" Time For Goal Achievement: 09/23/20 Potential to Achieve Goals: Good Progress towards PT goals: Not progressing toward goals - comment (continues to be very limited by dyspnea )     Frequency    Min 3X/week      PT Plan Current plan remains appropriate (however watching closely, may need CIR)    Co-evaluation              AM-PAC PT "6 Clicks" Mobility   Outcome Measure  Help needed turning from your back to your side while in a flat bed without using bedrails?: None Help needed moving from lying on your back to sitting on the side of a flat bed without using bedrails?: None Help needed moving to and from a bed to a chair (including a wheelchair)?: A Little Help needed standing up from a chair using your arms (e.g., wheelchair or bedside chair)?: A Little Help needed to walk in hospital room?: A Little Help needed climbing 3-5 steps with a railing? : A Lot 6 Click Score: 19    End of Session Equipment Utilized During Treatment: Oxygen Activity Tolerance: Patient limited by fatigue Patient left: in chair;with call bell/phone within reach Nurse Communication: Mobility status PT Visit Diagnosis: Other abnormalities of gait and mobility (R26.89)     Time: 1610-9604 PT Time Calculation (min) (ACUTE ONLY): 27 min  Charges:  $Therapeutic Activity: 8-22 mins $Self Care/Home Management: 8-22                      Arby Barrette, PT Pager [phone]    Rexanne Mano 09/12/2020, 4:57 PM

## 2020-09-12 NOTE — Progress Notes (Signed)
[place] for lovenox Indication: r/o VTE  Labs: Recent Labs    09/10/20 1056 09/10/20 1056 09/11/20 1116 09/12/20 0351  HGB 10.8*   < > 10.8* 9.8*  HCT 35.2*  --  35.5* 32.1*  PLT 444*  --  455* 388  CREATININE 0.86  --  0.85 0.75   < > = values in this interval not displayed.    Assessment: 66 yof presenting COVID-19 positive. Pharmacy consulted for treatment dose Lovenox for r/o VTE until d-dimer drops <2 per MD. D-dimer trended up to 14.76. Patient previously on Lovenox 0.5mg /kg q24h but increased to 1mg /kg on 9/29. Patient was not on anticoagulation PTA.  Hgb down slightly to 9.8, Plt 388. D-dimer down to 4.55 but still above MD goal of <2. Scr <1 stable. 10/1 dopplers not done, appears to be scheduled for later this afternoon. No active bleed issues reported by RN.  Goal of Therapy:  Anti-Xa level 0.6-1 units/ml 4hrs after LMWH dose given Monitor platelets by anticoagulation protocol: Yes   Plan:  Continue Lovenox 110mg  (1mg /kg) [place] q24h Monitor CBC, s/sx bleeding F/u VTE r/o, d-dimer trend  Claudina Lick, PharmD PGY1 Acute Care Pharmacy Resident 09/12/2020 9:08 AM  Please check [email or web address] for unit-specific pharmacy phone numbers.

## 2020-09-13 ENCOUNTER — Inpatient Hospital Stay (HOSPITAL_COMMUNITY): Payer: HRSA Program

## 2020-09-13 DIAGNOSIS — J9601 Acute respiratory failure with hypoxia: Secondary | ICD-10-CM | POA: Diagnosis not present

## 2020-09-13 DIAGNOSIS — J80 Acute respiratory distress syndrome: Secondary | ICD-10-CM | POA: Diagnosis not present

## 2020-09-13 DIAGNOSIS — U071 COVID-19: Secondary | ICD-10-CM | POA: Diagnosis not present

## 2020-09-13 LAB — CBC WITH DIFFERENTIAL/PLATELET
Abs Immature Granulocytes: 0.2 10*3/uL — ABNORMAL HIGH (ref 0.00–0.07)
Basophils Absolute: 0 10*3/uL (ref 0.0–0.1)
Basophils Relative: 0 %
Eosinophils Absolute: 0.1 10*3/uL (ref 0.0–0.5)
Eosinophils Relative: 0 %
HCT: 34.3 % — ABNORMAL LOW (ref 36.0–46.0)
Hemoglobin: 10.2 g/dL — ABNORMAL LOW (ref 12.0–15.0)
Immature Granulocytes: 1 %
Lymphocytes Relative: 8 %
Lymphs Abs: 1.6 10*3/uL (ref 0.7–4.0)
MCH: 26.2 pg (ref 26.0–34.0)
MCHC: 29.7 g/dL — ABNORMAL LOW (ref 30.0–36.0)
MCV: 88.2 fL (ref 80.0–100.0)
Monocytes Absolute: 0.9 10*3/uL (ref 0.1–1.0)
Monocytes Relative: 4 %
Neutro Abs: 18.3 10*3/uL — ABNORMAL HIGH (ref 1.7–7.7)
Neutrophils Relative %: 87 %
Platelets: 359 10*3/uL (ref 150–400)
RBC: 3.89 MIL/uL (ref 3.87–5.11)
RDW: 13 % (ref 11.5–15.5)
WBC: 21.2 10*3/uL — ABNORMAL HIGH (ref 4.0–10.5)
nRBC: 0 % (ref 0.0–0.2)

## 2020-09-13 LAB — COMPREHENSIVE METABOLIC PANEL
ALT: 233 U/L — ABNORMAL HIGH (ref 0–44)
AST: 110 U/L — ABNORMAL HIGH (ref 15–41)
Albumin: 2.4 g/dL — ABNORMAL LOW (ref 3.5–5.0)
Alkaline Phosphatase: 70 U/L (ref 38–126)
Anion gap: 8 (ref 5–15)
BUN: 13 mg/dL (ref 8–23)
CO2: 27 mmol/L (ref 22–32)
Calcium: 8.9 mg/dL (ref 8.9–10.3)
Chloride: 101 mmol/L (ref 98–111)
Creatinine, Ser: 0.77 mg/dL (ref 0.44–1.00)
GFR calc Af Amer: >60 mL/min (ref >60)
GFR calc non Af Amer: >60 mL/min (ref >60)
Glucose, Bld: 121 mg/dL — ABNORMAL HIGH (ref 70–99)
Potassium: 3.8 mmol/L (ref 3.5–5.1)
Sodium: 136 mmol/L (ref 135–145)
Total Bilirubin: 0.8 mg/dL (ref 0.3–1.2)
Total Protein: 6.1 g/dL — ABNORMAL LOW (ref 6.5–8.1)

## 2020-09-13 LAB — GLUCOSE, CAPILLARY
Glucose-Capillary: 128 mg/dL — ABNORMAL HIGH (ref 70–99)
Glucose-Capillary: 109 mg/dL — ABNORMAL HIGH (ref 70–99)
Glucose-Capillary: 81 mg/dL (ref 70–99)
Glucose-Capillary: 108 mg/dL — ABNORMAL HIGH (ref 70–99)
Glucose-Capillary: 196 mg/dL — ABNORMAL HIGH (ref 70–99)
Glucose-Capillary: 198 mg/dL — ABNORMAL HIGH (ref 70–99)
Glucose-Capillary: 138 mg/dL — ABNORMAL HIGH (ref 70–99)

## 2020-09-13 LAB — BRAIN NATRIURETIC PEPTIDE: B Natriuretic Peptide: 32.6 pg/mL (ref 0.0–100.0)

## 2020-09-13 LAB — D-DIMER, QUANTITATIVE: D-Dimer, Quant: 5.15 ug/mL-FEU — ABNORMAL HIGH (ref 0.00–0.50)

## 2020-09-13 LAB — PROCALCITONIN: Procalcitonin: <0.1 ng/mL

## 2020-09-13 LAB — C-REACTIVE PROTEIN: CRP: 6.3 mg/dL — ABNORMAL HIGH (ref <1.0)

## 2020-09-13 LAB — MAGNESIUM: Magnesium: 2.2 mg/dL (ref 1.7–2.4)

## 2020-09-13 MED ORDER — METHYLPREDNISOLONE SODIUM SUCC 40 MG IJ SOLR
40.0000 mg | Freq: Every day | INTRAMUSCULAR | Status: AC
  Administered 2020-09-13 – 2020-09-16 (×4): 40 mg via INTRAVENOUS
  Filled 2020-09-13 (×4): qty 1

## 2020-09-13 MED ORDER — IOHEXOL 350 MG/ML SOLN
55.0000 mL | Freq: Once | INTRAVENOUS | Status: AC | PRN
  Administered 2020-09-13: 55 mL via INTRAVENOUS

## 2020-09-13 MED ORDER — METOPROLOL TARTRATE 12.5 MG HALF TABLET
12.5000 mg | ORAL_TABLET | Freq: Two times a day (BID) | ORAL | Status: DC
  Administered 2020-09-13 – 2020-09-27 (×20): 12.5 mg via ORAL
  Filled 2020-09-13 (×31): qty 1

## 2020-09-13 NOTE — Progress Notes (Signed)
PROGRESS NOTE                                                                                                                                                                                                             Patient Demographics:    Wendy Ruiz, is a 64 y.o. female, DOB - 1956-10-03, BSJ:[id number]  Outpatient Primary MD for the patient is Lyndee Hensen, DO    LOS - 14  Admit date - 08/29/2020    Chief Complaint  Patient presents with  . Shortness of Breath       Brief Narrative  - 64 year old African-American female with past medical history of bipolar disorder, obesity, GERD who was admitted to the hospital on 08/29/2020 with severe acute hypoxic respiratory failure due to COVID-19 pneumonia, unfortunately patient is not vaccinated.  She was under the care of ICU and was BiPAP dependent for a while.  She has been transferred to hospitalist service on 09/09/2020.  Currently on 50 L of heated high flow at 100% FiO2 along with nonrebreather mask.   Subjective:   Patient sitting in recliner denies any headache chest or abdominal pain, shortness of breath is improved.  No focal weakness.   Assessment  & Plan :     1. Acute Hypoxic Resp. Failure due to Acute Covid 19 Viral Pneumonitis during the ongoing 2020 Covid 19 Pandemic - she is unfortunately not vaccinated and has incurred severe parenchymal lung injury, she is being treated with combination of remdesivir, IV steroids and Baricitinib.  Was under the care of ICU and transferred to hospitalist service on 09/09/2020, she is currently on 20 L heated high flow and 100% FiO2 plus nonrebreather (down from 50+NRB).  Due to worsening leukocytosis and oxygen demands she has been placed on 5 days of oral Levaquin starting on 10/07/2020 with some improvement, continue to monitor, remains extremely guarded.  Encouraged the patient to sit up in chair in the daytime use I-S  and flutter valve for pulmonary toiletry and then prone in bed when at night.  Will advance activity and titrate down oxygen as possible.   SpO2: 95 % O2 Flow Rate (L/min): 20 L/min FiO2 (%): 80 %  Recent Labs  Lab 09/07/20 1758 09/09/20 0906 09/10/20 1056 09/11/20 1116 09/12/20 0351 09/13/20 0515  WBC  --  27.1* 21.7* 25.6* 16.6* 21.2*  PLT  --  497* 444* 455* 388 359  CRP  --  8.0* 6.3* 3.9* 3.8* 6.3*  BNP  --  34.0 18.3 51.9 20.5 32.6  DDIMER  --  14.76* 7.65* 7.18* 4.55* 5.15*  PROCALCITON  --  <0.10 <0.10 <0.10 <0.10 <0.10  AST 43*  --  61* 82* 82* 110*  ALT 49*  --  100* 143* 157* 233*  ALKPHOS 78  --  71 68 61 70  BILITOT 0.7  --  0.7 0.6 0.6 0.8  ALBUMIN 2.3*  --  2.3* 2.5* 2.2* 2.4*     2.  Extremely elevated D-dimer.  Full dose Lovenox.  Initial CTA and lower extremity leg ultrasound unremarkable, Pete leg ultrasound on 09/12/2020, CTA ordered.   3.  Intermediate quanteferon checked in ICU.  Outpatient monitoring.  CTA does not show any signs of active tuberculosis, patient follow-up with ID post discharge.  4.  GERD.  PPI.  6.  Mild tachycardia.  Stable TSH, CTA ordered and pending.      Condition - Extremely Guarded  Family Communication  :   Daughter [name] [phone] on 09/09/2020 updated, message left on 09/10/2020 at 11:19 AM, message left 09/11/2020 at 8:05 AM, updated 09/13/2020  Daughter Joi [phone] on 09/09/20 updated,    Code Status : Full  Consults  :  PCCM  Procedures  :    CTA 9/19 with no PE, near confluent dense groundglass opacities in all lobes of bilateral lungs 09/02/2019 one 2D echo essentially unremarkable  923 lower extremity Doppler studies-> neg 10/2 - repeat duplex  -negative 10/3 - CTA   PUD Prophylaxis : PPI  Disposition Plan  :    Status is: Inpatient  Remains inpatient appropriate because:IV treatments appropriate due to intensity of illness or inability to take PO   Dispo: The patient is from: Home               Anticipated d/c is to: Home              Anticipated d/c date is: > 3 days              Patient currently is not medically stable to d/c.   DVT Prophylaxis  :  Lovenox   Lab Results  Component Value Date   PLT 359 09/13/2020    Diet :  Diet Order            Diet Carb Modified Fluid consistency: Thin; Room service appropriate? Yes  Diet effective now                  Inpatient Medications  Scheduled Meds: . Chlorhexidine Gluconate Cloth  6 each Topical Daily  . enoxaparin (LOVENOX) injection  110 mg Subcutaneous Q12H  . feeding supplement (ENSURE ENLIVE)  237 mL Oral TID  . insulin aspart  0-15 Units Subcutaneous Q4H  . insulin detemir  10 Units Subcutaneous BID  . mouth rinse  15 mL Mouth Rinse BID  . methylPREDNISolone (SOLU-MEDROL) injection  40 mg Intravenous Daily  . metoprolol tartrate  12.5 mg Oral BID  . pantoprazole  40 mg Oral Q1200  . polyethylene glycol  17 g Oral BID  . sodium chloride flush  10-40 mL Intracatheter Q12H   Continuous Infusions: . sodium chloride 10 mL/hr at 09/07/20 2000   PRN Meds:.sodium chloride, acetaminophen, guaiFENesin-dextromethorphan, lip balm, naphazoline-glycerin, phenol, sodium chloride  Antibiotics  :    Anti-infectives (From admission, onward)   Start  Dose/Rate Route Frequency Ordered Stop   09/11/20 1100  levofloxacin (LEVAQUIN) tablet 750 mg        750 mg Oral Daily 09/11/20 1024 09/12/20 0941   09/09/20 1415  levofloxacin (LEVAQUIN) tablet 750 mg        750 mg Oral Daily 09/09/20 1351 09/11/20 0907   09/03/20 1115  ceFEPIme (MAXIPIME) 2 g in sodium chloride 0.9 % 100 mL IVPB  Status:  Discontinued        2 g 200 mL/hr over 30 Minutes Intravenous Every 8 hours 09/03/20 1104 09/08/20 1140   09/03/20 1100  azithromycin (ZITHROMAX) 500 mg in sodium chloride 0.9 % 250 mL IVPB  Status:  Discontinued        500 mg 250 mL/hr over 60 Minutes Intravenous Every 24 hours 09/03/20 1052 09/04/20 0854   08/31/20 1000   remdesivir 100 mg in sodium chloride 0.9 % 100 mL IVPB       "Followed by" Linked Group Details   100 mg 200 mL/hr over 30 Minutes Intravenous Daily 08/30/20 0306 09/03/20 1000   08/30/20 0400  remdesivir 200 mg in sodium chloride 0.9% 250 mL IVPB       "Followed by" Linked Group Details   200 mg 580 mL/hr over 30 Minutes Intravenous Once 08/30/20 0306 08/30/20 0647       Time Spent in minutes  30   Lala Lund M.D on 09/13/2020 at 9:53 AM  To page go to [email or web address] - password [facility]  [facility] -  Office  [phone]     See all Orders from today for further details    Objective:   Vitals:   09/12/20 2346 09/13/20 0025 09/13/20 0407 09/13/20 0735  BP: 104/83  101/60 (!) 110/56  Pulse: 80  91 (!) 105  Resp: 20  20   Temp: 98.6 F (37 C)  98.4 F (36.9 C) (!) 97.4 F (36.3 C)  TempSrc: Axillary  Axillary Axillary  SpO2: 100% 94% 95%   Weight:      Height:        Wt Readings from Last 3 Encounters:  09/07/20 108.5 kg  05/14/13 102.5 kg  07/15/12 95.3 kg     Intake/Output Summary (Last 24 hours) at 09/13/2020 0953 Last data filed at 09/12/2020 1500 Gross per 24 hour  Intake 133.75 ml  Output 150 ml  Net -16.25 ml     Physical Exam  Awake Alert, No new F.N deficits, Normal affect [place].AT,PERRAL Supple Neck,No JVD, No cervical lymphadenopathy appriciated.  Symmetrical Chest wall movement, Good air movement bilaterally, CTAB RRR,No Gallops, Rubs or new Murmurs, No Parasternal Heave +ve B.Sounds, Abd Soft, No tenderness, No organomegaly appriciated, No rebound - guarding or rigidity. No Cyanosis, Clubbing or edema, No new Rash or bruise    Data Review:    CBC Recent Labs  Lab 09/09/20 0906 09/10/20 1056 09/11/20 1116 09/12/20 0351 09/13/20 0515  WBC 27.1* 21.7* 25.6* 16.6* 21.2*  HGB 10.9* 10.8* 10.8* 9.8* 10.2*  HCT 35.6* 35.2* 35.5* 32.1* 34.3*  PLT 497* 444* 455* 388 359  MCV 86.8 87.3 86.8 87.5 88.2  MCH 26.6 26.8 26.4 26.7 26.2   MCHC 30.6 30.7 30.4 30.5 29.7*  RDW 12.7 12.6 12.7 12.8 13.0  LYMPHSABS  --  0.5* 0.3* 1.7 1.6  MONOABS  --  0.7 0.8 0.9 0.9  EOSABS  --  0.0 0.0 0.0 0.1  BASOSABS  --  0.0 0.0 0.0 0.0    Recent Labs  Lab 09/07/20 1758  09/07/20 1758 09/09/20 0906 09/10/20 1056 09/11/20 1116 09/12/20 0351 09/13/20 0515  NA 134*   < > 137 131* 136 138 136  K 4.9   < > 4.0 3.8 4.0 3.8 3.8  CL 100   < > 100 95* 98 102 101  CO2 23   < > 24 22 26 25 27   GLUCOSE 227*   < > 145* 286* 174* 78 121*  BUN 15   < > 17 22 20 16 13   CREATININE 0.94   < > 0.88 0.86 0.85 0.75 0.77  CALCIUM 8.7*   < > 9.0 8.9 9.0 8.8* 8.9  AST 43*  --   --  61* 82* 82* 110*  ALT 49*  --   --  100* 143* 157* 233*  ALKPHOS 78  --   --  71 68 61 70  BILITOT 0.7  --   --  0.7 0.6 0.6 0.8  ALBUMIN 2.3*  --   --  2.3* 2.5* 2.2* 2.4*  MG 2.4   < > 2.2 2.1 2.2 2.2 2.2  CRP  --   --  8.0* 6.3* 3.9* 3.8* 6.3*  DDIMER  --   --  14.76* 7.65* 7.18* 4.55* 5.15*  PROCALCITON  --   --  <0.10 <0.10 <0.10 <0.10 <0.10  TSH  --   --  1.705  --   --   --   --   BNP  --   --  34.0 18.3 51.9 20.5 32.6   < > = values in this interval not displayed.    ------------------------------------------------------------------------------------------------------------------ No results for input(s): CHOL, HDL, LDLCALC, TRIG, CHOLHDL, LDLDIRECT in the last 72 hours.  Lab Results  Component Value Date   HGBA1C 5.7 (H) 08/31/2020   ------------------------------------------------------------------------------------------------------------------ No results for input(s): TSH, T4TOTAL, T3FREE, THYROIDAB in the last 72 hours.  Invalid input(s): FREET3  Cardiac Enzymes No results for input(s): CKMB, TROPONINI, MYOGLOBIN in the last 168 hours.  Invalid input(s): CK ------------------------------------------------------------------------------------------------------------------    Component Value Date/Time   BNP 32.6 09/13/2020 0515    Micro  Results Recent Results (from the past 240 hour(s))  Culture, blood (single)     Status: None   Collection Time: 09/03/20  5:06 PM   Specimen: BLOOD LEFT HAND  Result Value Ref Range Status   Specimen Description BLOOD LEFT HAND  Final   Special Requests   Final    BOTTLES DRAWN AEROBIC AND ANAEROBIC Blood Culture adequate volume   Culture   Final    NO GROWTH 5 DAYS Performed at [facility], [address] [place], [place] [address]    Report Status 09/08/2020 FINAL  Final  Expectorated sputum assessment w rflx to resp cult     Status: None   Collection Time: 09/09/20  1:52 PM   Specimen: Expectorated Sputum  Result Value Ref Range Status   Specimen Description EXPECTORATED SPUTUM  Final   Special Requests NONE  Final   Sputum evaluation   Final    Sputum specimen not acceptable for testing.  Please recollect.   Gram Stain Report Called to,Read Back By and Verified With: J. SCOTT RN, AT [phone] 09/11/20 BY Rush Landmark Performed at [facility], [place] [address], [place], [place] [address]    Report Status 09/11/2020 FINAL  Final    Radiology Reports CT ANGIO CHEST PE W OR WO CONTRAST  Result Date: 08/30/2020 CLINICAL DATA:  COVID-19 infection, now with cough, fever and shortness of breath  for the past 2 days. Evaluate for pulmonary embolism. EXAM: CT ANGIOGRAPHY CHEST WITH CONTRAST TECHNIQUE: Multidetector CT imaging of the chest was performed using the standard protocol during bolus administration of intravenous contrast. Multiplanar CT image reconstructions and MIPs were obtained to evaluate the vascular anatomy. CONTRAST:  34mL OMNIPAQUE IOHEXOL 350 MG/ML SOLN COMPARISON:  Chest CT-08/29/2020; 01/12/2018; 05/24/2017 FINDINGS: Vascular Findings: There is suboptimal opacification of the pulmonary arterial system with the main pulmonary artery measuring only 195 Hounsfield units. Given this limitation, there are no discrete filling defects within the pulmonary arterial tree to  the level the bilateral subsegmental pulmonary arteries. Evaluation of the distal subsegmental pulmonary arteries is degraded secondary to a combination of suboptimal vessel opacification as well as patient respiratory artifact. Normal caliber of the main pulmonary artery. Cardiomegaly.  No pericardial effusion. Mild ectasia of the ascending thoracic aorta measuring 38 mm in diameter. Conventional configuration of the aortic arch. The descending thoracic aorta is of normal caliber and widely patent without hemodynamically significant narrowing. There is a minimal amount of atherosclerotic plaque involving the aortic arch and proximal descending thoracic aorta, not resulting in hemodynamically significant stenosis. Review of the MIP images confirms the above findings. ---------------------------------------------------------------------------------- Nonvascular Findings: Mediastinum/Lymph Nodes: Scattered prominent mediastinal and hilar lymph nodes are not enlarged by size criteria with index AP window lymph node measuring 0.9 cm in greatest short axis diameter (image 33, series 5) and index left infrahilar lymph node measuring 0.8 cm (image 43, series 5), presumably reactive in etiology. No bulky mediastinal, hilar or axillary lymphadenopathy. Lungs/Pleura: Extensive bilateral heterogeneous and consolidative airspace opacities involving near the entirety of the lungs bilaterally with associated scattered air bronchograms. No pleural effusion or pneumothorax. The central pulmonary airways appear widely patent. Upper abdomen: Limited arterial phase of the upper abdomen demonstrates multiple cholesterol containing gallstones within an otherwise normal-appearing gallbladder. Musculoskeletal: No acute or aggressive osseous abnormalities. Stigmata of dish within the thoracic spine. Regional soft tissues appear normal. Normal appearance of the thyroid gland. IMPRESSION: 1. No evidence of pulmonary embolism to the level the  bilateral subsegmental pulmonary arteries. 2. Extensive heterogeneous and consolidative airspace opacities involving near the entirety of the lungs bilaterally compatible with provided history of COVID 19 pneumonia. 3. Cardiomegaly. 4. Aortic Atherosclerosis (ICD10-I70.0). 5. Cholelithiasis. Electronically Signed   By: Sandi Mariscal M.D.   On: 08/30/2020 09:00   DG Chest Port 1 View  Result Date: 09/09/2020 CLINICAL DATA:  COVID positive, shortness of breath EXAM: PORTABLE CHEST 1 VIEW COMPARISON:  09/03/2020 FINDINGS: Severe diffuse bilateral airspace disease, worsening since prior study. Heart is borderline in size. No visible effusions or pneumothorax. IMPRESSION: Severe diffuse bilateral airspace disease, worsening since prior study. Electronically Signed   By: Rolm Baptise M.D.   On: 09/09/2020 09:21   DG CHEST PORT 1 VIEW  Result Date: 09/03/2020 CLINICAL DATA:  Adult respiratory distress syndrome. Reported COVID-19 positive EXAM: PORTABLE CHEST 1 VIEW COMPARISON:  August 29, 2020 chest radiograph and chest CT August 30, 2020 FINDINGS: There is increased airspace opacity throughout the lungs bilaterally with a more even distribution of airspace opacity compared to prior studies. No consolidation. Heart is mildly enlarged with pulmonary vascularity normal. No adenopathy. No bone lesions. IMPRESSION: Persistent widespread airspace opacity bilaterally without areas of consolidation. Appearance consistent with multifocal atypical organism pneumonia. A degree of underlying ARDS may be present. Stable cardiac prominence. No adenopathy evident by radiography. Electronically Signed   By: Lowella Grip III M.D.   On: 09/03/2020 07:59  DG Chest Port 1 View  Result Date: 08/29/2020 CLINICAL DATA:  COVID-19 positive, hypoxia, cough EXAM: PORTABLE CHEST 1 VIEW COMPARISON:  01/12/2018 FINDINGS: Single frontal view of the chest demonstrates unremarkable cardiac silhouette. There is widespread bilateral  airspace disease, without effusion or pneumothorax. No acute bony abnormality. IMPRESSION: 1. Bilateral multifocal pneumonia compatible with COVID 19. Electronically Signed   By: Randa Ngo M.D.   On: 08/29/2020 22:54   VAS [place] LOWER EXTREMITY VENOUS (DVT)  Result Date: 09/12/2020  Lower Venous DVT Study Indications: Covid-19, D-Dimer.  Comparison Study: Prior negative study from 09/03/20 is available for comparison Performing Technologist: Sharion Dove RVS  Examination Guidelines: A complete evaluation includes B-mode imaging, spectral Doppler, color Doppler, and power Doppler as needed of all accessible portions of each vessel. Bilateral testing is considered an integral part of a complete examination. Limited examinations for reoccurring indications may be performed as noted. The reflux portion of the exam is performed with the patient in reverse Trendelenburg.  +---------+---------------+---------+-----------+----------+--------------+ RIGHT    CompressibilityPhasicitySpontaneityPropertiesThrombus Aging +---------+---------------+---------+-----------+----------+--------------+ CFV      Full           Yes      Yes                                 +---------+---------------+---------+-----------+----------+--------------+ SFJ      Full                                                        +---------+---------------+---------+-----------+----------+--------------+ FV Prox  Full                                                        +---------+---------------+---------+-----------+----------+--------------+ FV Mid   Full                                                        +---------+---------------+---------+-----------+----------+--------------+ FV DistalFull                                                        +---------+---------------+---------+-----------+----------+--------------+ PFV      Full                                                         +---------+---------------+---------+-----------+----------+--------------+ POP      Full           Yes      Yes                                 +---------+---------------+---------+-----------+----------+--------------+ PTV  Full                                                        +---------+---------------+---------+-----------+----------+--------------+ PERO     Full                                                        +---------+---------------+---------+-----------+----------+--------------+   +---------+---------------+---------+-----------+----------+--------------+ LEFT     CompressibilityPhasicitySpontaneityPropertiesThrombus Aging +---------+---------------+---------+-----------+----------+--------------+ CFV      Full           Yes      Yes                                 +---------+---------------+---------+-----------+----------+--------------+ SFJ      Full                                                        +---------+---------------+---------+-----------+----------+--------------+ FV Prox  Full                                                        +---------+---------------+---------+-----------+----------+--------------+ FV Mid   Full                                                        +---------+---------------+---------+-----------+----------+--------------+ FV DistalFull                                                        +---------+---------------+---------+-----------+----------+--------------+ PFV      Full                                                        +---------+---------------+---------+-----------+----------+--------------+ POP      Full           Yes      Yes                                 +---------+---------------+---------+-----------+----------+--------------+ PTV      Full                                                         +---------+---------------+---------+-----------+----------+--------------+  PERO     Full                                                        +---------+---------------+---------+-----------+----------+--------------+     Summary: BILATERAL: - No evidence of deep vein thrombosis seen in the lower extremities, bilaterally. - RIGHT: - Findings appear essentially unchanged compared to previous examination.  LEFT: - Findings appear essentially unchanged compared to previous examination.  *See table(s) above for measurements and observations.    Preliminary    VAS [place] LOWER EXTREMITY VENOUS (DVT)  Result Date: 09/03/2020  Lower Venous DVTStudy Indications: Edema.  Limitations: Patient positioning. Comparison Study: no prior Performing Technologist: Abram Sander RVS  Examination Guidelines: A complete evaluation includes B-mode imaging, spectral Doppler, color Doppler, and power Doppler as needed of all accessible portions of each vessel. Bilateral testing is considered an integral part of a complete examination. Limited examinations for reoccurring indications may be performed as noted. The reflux portion of the exam is performed with the patient in reverse Trendelenburg.  +---------+---------------+---------+-----------+----------+--------------+ RIGHT    CompressibilityPhasicitySpontaneityPropertiesThrombus Aging +---------+---------------+---------+-----------+----------+--------------+ CFV      Full           Yes      Yes                                 +---------+---------------+---------+-----------+----------+--------------+ SFJ      Full                                                        +---------+---------------+---------+-----------+----------+--------------+ FV Prox  Full                                                        +---------+---------------+---------+-----------+----------+--------------+ FV Mid   Full                                                         +---------+---------------+---------+-----------+----------+--------------+ FV DistalFull                                                        +---------+---------------+---------+-----------+----------+--------------+ PFV      Full                                                        +---------+---------------+---------+-----------+----------+--------------+ POP      Full           Yes  Yes                                 +---------+---------------+---------+-----------+----------+--------------+ PTV      Full                                                        +---------+---------------+---------+-----------+----------+--------------+ PERO     Full                                                        +---------+---------------+---------+-----------+----------+--------------+   +---------+---------------+---------+-----------+----------+--------------+ LEFT     CompressibilityPhasicitySpontaneityPropertiesThrombus Aging +---------+---------------+---------+-----------+----------+--------------+ CFV      Full           Yes      Yes                                 +---------+---------------+---------+-----------+----------+--------------+ SFJ      Full                                                        +---------+---------------+---------+-----------+----------+--------------+ FV Prox  Full                                                        +---------+---------------+---------+-----------+----------+--------------+ FV Mid   Full                                                        +---------+---------------+---------+-----------+----------+--------------+ FV DistalFull                                                        +---------+---------------+---------+-----------+----------+--------------+ PFV      Full                                                         +---------+---------------+---------+-----------+----------+--------------+ POP      Full           Yes      Yes                                 +---------+---------------+---------+-----------+----------+--------------+ PTV      Full                                                        +---------+---------------+---------+-----------+----------+--------------+  PERO                                                  Not visualized +---------+---------------+---------+-----------+----------+--------------+     Summary: BILATERAL: - No evidence of deep vein thrombosis seen in the lower extremities, bilaterally. - No evidence of superficial venous thrombosis in the lower extremities, bilaterally. -   *See table(s) above for measurements and observations. Electronically signed by Servando Snare MD on 09/03/2020 at 7:12:00 PM.    Final    ECHOCARDIOGRAM LIMITED  Result Date: 09/02/2020    ECHOCARDIOGRAM LIMITED REPORT   Patient Name:   KRISTYANA NOTTE Date of Exam: 09/01/2020 Medical Rec #:  [id number]           Height:       64.0 in Accession #:    [id number]          Weight:       255.3 lb Date of Birth:  1956-08-11           BSA:          2.170 m Patient Age:    37 years            BP:           122/79 mmHg Patient Gender: F                   HR:           97 bpm. Exam Location:  Inpatient Procedure: 2D Echo                                 MODIFIED REPORT: This report was modified by Sanda Klein MD on 09/02/2020 due to Corrected IVC                                    statement.  Indications:     786.09 dyspnea  History:         Patient has no prior history of Echocardiogram examinations.                  Risk Factors:Former Smoker. Covid +.  Sonographer:     Jannett Celestine RDCS (AE) Referring Phys:  [id number] [place] Clam Diagnosing Phys: Sanda Klein MD IMPRESSIONS  1. Left ventricular ejection fraction, by estimation, is 60 to 65%. The left ventricle has normal function. The left  ventricle has no regional wall motion abnormalities. Left ventricular diastolic parameters were normal.  2. Right ventricular systolic function is normal. The right ventricular size is normal. There is severely elevated pulmonary artery systolic pressure.  3. The mitral valve is normal in structure. No evidence of mitral valve regurgitation. No evidence of mitral stenosis.  4. The aortic valve is normal in structure. Aortic valve regurgitation is not visualized. No aortic stenosis is present.  5. The inferior vena cava is dilated in size with <50% respiratory variability, suggesting right atrial pressure of 15 mmHg. FINDINGS  Left Ventricle: Left ventricular ejection fraction, by estimation, is 60 to 65%. The left ventricle has normal function. The left ventricle has no regional wall motion abnormalities. The left ventricular internal cavity size was normal in size. There is  no left  ventricular hypertrophy. Left ventricular diastolic parameters were normal. Right Ventricle: The right ventricular size is normal. No increase in right ventricular wall thickness. Right ventricular systolic function is normal. There is severely elevated pulmonary artery systolic pressure. The tricuspid regurgitant velocity is 3.43 m/s, and with an assumed right atrial pressure of 15 mmHg, the estimated right ventricular systolic pressure is 41.6 mmHg. Left Atrium: Left atrial size was normal in size. Right Atrium: Right atrial size was normal in size. Pericardium: There is no evidence of pericardial effusion. Mitral Valve: The mitral valve is normal in structure. No evidence of mitral valve stenosis. Tricuspid Valve: The tricuspid valve is normal in structure. Tricuspid valve regurgitation is trivial. No evidence of tricuspid stenosis. Aortic Valve: The aortic valve is normal in structure. Aortic valve regurgitation is not visualized. No aortic stenosis is present. Pulmonic Valve: The pulmonic valve was normal in structure. Pulmonic  valve regurgitation is not visualized. No evidence of pulmonic stenosis. Aorta: The aortic root is normal in size and structure. Venous: The inferior vena cava is dilated in size with less than 50% respiratory variability, suggesting right atrial pressure of 15 mmHg. IAS/Shunts: No atrial level shunt detected by color flow Doppler. LEFT VENTRICLE PLAX 2D LVIDd:         4.25 cm Diastology LVIDs:         3.06 cm LV e' medial:    8.38 cm/s LV PW:         0.96 cm LV E/e' medial:  8.9 LV IVS:        0.93 cm LV e' lateral:   12.40 cm/s                        LV E/e' lateral: 6.0  MITRAL VALVE               TRICUSPID VALVE MV Area (PHT): 4.26 cm    TR Peak grad:   47.1 mmHg MV Decel Time: 178 msec    TR Vmax:        343.00 cm/s MV E velocity: 74.55 cm/s MV A velocity: 68.98 cm/s MV E/A ratio:  1.08 Mihai Croitoru MD Electronically signed by Sanda Klein MD Signature Date/Time: 09/01/2020/3:40:50 PM    Final (Updated)

## 2020-09-14 DIAGNOSIS — U071 COVID-19: Secondary | ICD-10-CM | POA: Diagnosis not present

## 2020-09-14 DIAGNOSIS — J80 Acute respiratory distress syndrome: Secondary | ICD-10-CM

## 2020-09-14 DIAGNOSIS — J9601 Acute respiratory failure with hypoxia: Secondary | ICD-10-CM | POA: Diagnosis not present

## 2020-09-14 LAB — CBC WITH DIFFERENTIAL/PLATELET
Abs Immature Granulocytes: 0.16 10*3/uL — ABNORMAL HIGH (ref 0.00–0.07)
Basophils Absolute: 0 10*3/uL (ref 0.0–0.1)
Basophils Relative: 0 %
Eosinophils Absolute: 0 10*3/uL (ref 0.0–0.5)
Eosinophils Relative: 0 %
HCT: 34 % — ABNORMAL LOW (ref 36.0–46.0)
Hemoglobin: 10.3 g/dL — ABNORMAL LOW (ref 12.0–15.0)
Immature Granulocytes: 1 %
Lymphocytes Relative: 7 %
Lymphs Abs: 1.3 10*3/uL (ref 0.7–4.0)
MCH: 26.8 pg (ref 26.0–34.0)
MCHC: 30.3 g/dL (ref 30.0–36.0)
MCV: 88.3 fL (ref 80.0–100.0)
Monocytes Absolute: 0.7 10*3/uL (ref 0.1–1.0)
Monocytes Relative: 4 %
Neutro Abs: 16.3 10*3/uL — ABNORMAL HIGH (ref 1.7–7.7)
Neutrophils Relative %: 88 %
Platelets: 335 10*3/uL (ref 150–400)
RBC: 3.85 MIL/uL — ABNORMAL LOW (ref 3.87–5.11)
RDW: 13.1 % (ref 11.5–15.5)
WBC: 18.6 10*3/uL — ABNORMAL HIGH (ref 4.0–10.5)
nRBC: 0 % (ref 0.0–0.2)

## 2020-09-14 LAB — COMPREHENSIVE METABOLIC PANEL
ALT: 277 U/L — ABNORMAL HIGH (ref 0–44)
AST: 115 U/L — ABNORMAL HIGH (ref 15–41)
Albumin: 2.4 g/dL — ABNORMAL LOW (ref 3.5–5.0)
Alkaline Phosphatase: 69 U/L (ref 38–126)
Anion gap: 11 (ref 5–15)
BUN: 14 mg/dL (ref 8–23)
CO2: 26 mmol/L (ref 22–32)
Calcium: 8.9 mg/dL (ref 8.9–10.3)
Chloride: 100 mmol/L (ref 98–111)
Creatinine, Ser: 0.79 mg/dL (ref 0.44–1.00)
GFR calc Af Amer: >60 mL/min (ref >60)
GFR calc non Af Amer: >60 mL/min (ref >60)
Glucose, Bld: 136 mg/dL — ABNORMAL HIGH (ref 70–99)
Potassium: 4.1 mmol/L (ref 3.5–5.1)
Sodium: 137 mmol/L (ref 135–145)
Total Bilirubin: 0.7 mg/dL (ref 0.3–1.2)
Total Protein: 6.1 g/dL — ABNORMAL LOW (ref 6.5–8.1)

## 2020-09-14 LAB — MAGNESIUM: Magnesium: 2.3 mg/dL (ref 1.7–2.4)

## 2020-09-14 LAB — GLUCOSE, CAPILLARY
Glucose-Capillary: 119 mg/dL — ABNORMAL HIGH (ref 70–99)
Glucose-Capillary: 96 mg/dL (ref 70–99)
Glucose-Capillary: 112 mg/dL — ABNORMAL HIGH (ref 70–99)
Glucose-Capillary: 261 mg/dL — ABNORMAL HIGH (ref 70–99)
Glucose-Capillary: 184 mg/dL — ABNORMAL HIGH (ref 70–99)
Glucose-Capillary: 224 mg/dL — ABNORMAL HIGH (ref 70–99)

## 2020-09-14 LAB — C-REACTIVE PROTEIN: CRP: 8.2 mg/dL — ABNORMAL HIGH (ref <1.0)

## 2020-09-14 LAB — D-DIMER, QUANTITATIVE: D-Dimer, Quant: 3.58 ug/mL-FEU — ABNORMAL HIGH (ref 0.00–0.50)

## 2020-09-14 LAB — BRAIN NATRIURETIC PEPTIDE: B Natriuretic Peptide: 32.8 pg/mL (ref 0.0–100.0)

## 2020-09-14 LAB — PROCALCITONIN: Procalcitonin: <0.1 ng/mL

## 2020-09-14 MED ORDER — FUROSEMIDE 40 MG PO TABS
40.0000 mg | ORAL_TABLET | Freq: Once | ORAL | Status: AC
  Administered 2020-09-14: 40 mg via ORAL
  Filled 2020-09-14: qty 1

## 2020-09-14 NOTE — Progress Notes (Signed)
Increased pt's HHFNC to 35 L/90% to trial pt without the NRB mask. Pt was untolerate and desat quickly to upper 70s. Increased HHFNC to 40 L with no improvement. Replaced NRB and pt's SpO2 increased back into the upper 90s. Slowly decreased pt's HHFNC back down to 20 L/90%. Pt's SpO2 maintaining in the mid 90s.

## 2020-09-14 NOTE — Progress Notes (Signed)
PROGRESS NOTE                                                                                                                                                                                                             Patient Demographics:    Wendy Ruiz, is a 64 y.o. female, DOB - March 31, 1956, QTM:[id number]  Outpatient Primary MD for the patient is Lyndee Hensen, DO    LOS - 15  Admit date - 08/29/2020    Chief Complaint  Patient presents with  . Shortness of Breath       Brief Narrative  - 64 year old African-American female with past medical history of bipolar disorder, obesity, GERD who was admitted to the hospital on 08/29/2020 with severe acute hypoxic respiratory failure due to COVID-19 pneumonia, unfortunately patient is not vaccinated.  She was under the care of ICU and was BiPAP dependent for a while.  She has been transferred to hospitalist service on 09/09/2020.  Currently on 50 L of heated high flow at 100% FiO2 along with nonrebreather mask.   Subjective:   Patient in the recliner, appears comfortable, denies any headache, no fever, no chest pain or pressure, improved shortness of breath , no abdominal pain. No focal weakness.    Assessment  & Plan :     1. Acute Hypoxic Resp. Failure due to Acute Covid 19 Viral Pneumonitis during the ongoing 2020 Covid 19 Pandemic - she is unfortunately not vaccinated and has incurred severe parenchymal lung injury, she is being treated with combination of remdesivir, IV steroids and Baricitinib.  Was under the care of ICU and transferred to hospitalist service on 09/09/2020, she is currently on 20 L heated high flow and 100% FiO2 plus nonrebreather (down from 50+NRB).  Due to worsening leukocytosis and oxygen demands she has been placed on 5 days of oral Levaquin starting on 10/07/2020 with some improvement, continue to monitor, remains extremely guarded.  Encouraged the  patient to sit up in chair in the daytime use I-S and flutter valve for pulmonary toiletry and then prone in bed when at night.  Will advance activity and titrate down oxygen as possible.   SpO2: 92 % O2 Flow Rate (L/min): 20 L/min FiO2 (%): (S) 90 %  Recent Labs  Lab 09/10/20 1056 09/11/20 1116 09/12/20 0351 09/13/20 0515 09/14/20 0145  WBC 21.7*  25.6* 16.6* 21.2* 18.6*  PLT 444* 455* 388 359 335  CRP 6.3* 3.9* 3.8* 6.3* 8.2*  BNP 18.3 51.9 20.5 32.6 32.8  DDIMER 7.65* 7.18* 4.55* 5.15* 3.58*  PROCALCITON <0.10 <0.10 <0.10 <0.10 <0.10  AST 61* 82* 82* 110* 115*  ALT 100* 143* 157* 233* 277*  ALKPHOS 71 68 61 70 69  BILITOT 0.7 0.6 0.6 0.8 0.7  ALBUMIN 2.3* 2.5* 2.2* 2.4* 2.4*     2.  Extremely elevated D-dimer.  Full dose Lovenox.  Initial CTA and lower extremity leg ultrasound unremarkable, Pete leg ultrasound on 09/12/2020, CTA -ve.   3.  Intermediate quanteferon checked in ICU.  Outpatient monitoring.  CTA does not show any signs of active tuberculosis, patient follow-up with ID post discharge.  4.  GERD.  PPI.  6.  Mild tachycardia.  Stable TSH, CTA -ve.      Condition - Extremely Guarded  Family Communication  :   Daughter [name] [phone] on 09/09/2020 updated, message left on 09/10/2020 at 11:19 AM, message left 09/11/2020 at 8:05 AM, updated 09/13/2020  Daughter Joi [phone] on 09/09/20 updated,    Code Status : Full  Consults  :  PCCM  Procedures  :    CTA 9/19 with no PE, near confluent dense groundglass opacities in all lobes of bilateral lungs 09/02/2019 one 2D echo essentially unremarkable  923 lower extremity Doppler studies-> neg 10/2 - repeat duplex  -negative 10/3 - CTA - no PE   PUD Prophylaxis : PPI  Disposition Plan  :    Status is: Inpatient  Remains inpatient appropriate because:IV treatments appropriate due to intensity of illness or inability to take PO   Dispo: The patient is from: Home              Anticipated d/c is  to: Home              Anticipated d/c date is: > 3 days              Patient currently is not medically stable to d/c.   DVT Prophylaxis  :  Lovenox   Lab Results  Component Value Date   PLT 335 09/14/2020    Diet :  Diet Order            Diet Carb Modified Fluid consistency: Thin; Room service appropriate? Yes  Diet effective now                  Inpatient Medications  Scheduled Meds: . Chlorhexidine Gluconate Cloth  6 each Topical Daily  . enoxaparin (LOVENOX) injection  110 mg Subcutaneous Q12H  . feeding supplement (ENSURE ENLIVE)  237 mL Oral TID  . insulin aspart  0-15 Units Subcutaneous Q4H  . insulin detemir  10 Units Subcutaneous BID  . mouth rinse  15 mL Mouth Rinse BID  . methylPREDNISolone (SOLU-MEDROL) injection  40 mg Intravenous Daily  . metoprolol tartrate  12.5 mg Oral BID  . pantoprazole  40 mg Oral Q1200  . polyethylene glycol  17 g Oral BID  . sodium chloride flush  10-40 mL Intracatheter Q12H   Continuous Infusions: . sodium chloride 10 mL/hr at 09/07/20 2000   PRN Meds:.sodium chloride, acetaminophen, guaiFENesin-dextromethorphan, lip balm, naphazoline-glycerin, phenol, sodium chloride  Antibiotics  :    Anti-infectives (From admission, onward)   Start     Dose/Rate Route Frequency Ordered Stop   09/11/20 1100  levofloxacin (LEVAQUIN) tablet 750 mg        750  mg Oral Daily 09/11/20 1024 09/12/20 0941   09/09/20 1415  levofloxacin (LEVAQUIN) tablet 750 mg        750 mg Oral Daily 09/09/20 1351 09/11/20 0907   09/03/20 1115  ceFEPIme (MAXIPIME) 2 g in sodium chloride 0.9 % 100 mL IVPB  Status:  Discontinued        2 g 200 mL/hr over 30 Minutes Intravenous Every 8 hours 09/03/20 1104 09/08/20 1140   09/03/20 1100  azithromycin (ZITHROMAX) 500 mg in sodium chloride 0.9 % 250 mL IVPB  Status:  Discontinued        500 mg 250 mL/hr over 60 Minutes Intravenous Every 24 hours 09/03/20 1052 09/04/20 0854   08/31/20 1000  remdesivir 100 mg in sodium  chloride 0.9 % 100 mL IVPB       "Followed by" Linked Group Details   100 mg 200 mL/hr over 30 Minutes Intravenous Daily 08/30/20 0306 09/03/20 1000   08/30/20 0400  remdesivir 200 mg in sodium chloride 0.9% 250 mL IVPB       "Followed by" Linked Group Details   200 mg 580 mL/hr over 30 Minutes Intravenous Once 08/30/20 0306 08/30/20 0647       Time Spent in minutes  30   Lala Lund M.D on 09/14/2020 at 10:51 AM  To page go to [email or web address] - password [facility]  [facility] -  Office  [phone]     See all Orders from today for further details    Objective:   Vitals:   09/14/20 0055 09/14/20 0435 09/14/20 0750 09/14/20 0759  BP: 118/71 101/62 124/72   Pulse: 91 96 100 (!) 106  Resp: (!) 22 (!) 21 (!) 22 (!) 25  Temp: 97.8 F (36.6 C) 97.8 F (36.6 C) 98.8 F (37.1 C)   TempSrc: Axillary Axillary Oral   SpO2: (!) 88% 93% 92% 92%  Weight:      Height:        Wt Readings from Last 3 Encounters:  09/07/20 108.5 kg  05/14/13 102.5 kg  07/15/12 95.3 kg     Intake/Output Summary (Last 24 hours) at 09/14/2020 1051 Last data filed at 09/14/2020 0900 Gross per 24 hour  Intake 150 ml  Output --  Net 150 ml     Physical Exam  Awake Alert, No new F.N deficits, Normal affect [place].AT,PERRAL Supple Neck,No JVD, No cervical lymphadenopathy appriciated.  Symmetrical Chest wall movement, Good air movement bilaterally, few rales RRR,No Gallops, Rubs or new Murmurs, No Parasternal Heave +ve B.Sounds, Abd Soft, No tenderness, No organomegaly appriciated, No rebound - guarding or rigidity. No Cyanosis, Clubbing or edema, No new Rash or bruise     Data Review:    CBC Recent Labs  Lab 09/10/20 1056 09/11/20 1116 09/12/20 0351 09/13/20 0515 09/14/20 0145  WBC 21.7* 25.6* 16.6* 21.2* 18.6*  HGB 10.8* 10.8* 9.8* 10.2* 10.3*  HCT 35.2* 35.5* 32.1* 34.3* 34.0*  PLT 444* 455* 388 359 335  MCV 87.3 86.8 87.5 88.2 88.3  MCH 26.8 26.4 26.7 26.2 26.8  MCHC 30.7  30.4 30.5 29.7* 30.3  RDW 12.6 12.7 12.8 13.0 13.1  LYMPHSABS 0.5* 0.3* 1.7 1.6 1.3  MONOABS 0.7 0.8 0.9 0.9 0.7  EOSABS 0.0 0.0 0.0 0.1 0.0  BASOSABS 0.0 0.0 0.0 0.0 0.0    Recent Labs  Lab 09/09/20 0906 09/09/20 0906 09/10/20 1056 09/11/20 1116 09/12/20 0351 09/13/20 0515 09/14/20 0145  NA 137   < > 131* 136 138 136 137  K 4.0   < >  3.8 4.0 3.8 3.8 4.1  CL 100   < > 95* 98 102 101 100  CO2 24   < > 22 26 25 27 26   GLUCOSE 145*   < > 286* 174* 78 121* 136*  BUN 17   < > 22 20 16 13 14   CREATININE 0.88   < > 0.86 0.85 0.75 0.77 0.79  CALCIUM 9.0   < > 8.9 9.0 8.8* 8.9 8.9  AST  --   --  61* 82* 82* 110* 115*  ALT  --   --  100* 143* 157* 233* 277*  ALKPHOS  --   --  71 68 61 70 69  BILITOT  --   --  0.7 0.6 0.6 0.8 0.7  ALBUMIN  --   --  2.3* 2.5* 2.2* 2.4* 2.4*  MG 2.2   < > 2.1 2.2 2.2 2.2 2.3  CRP 8.0*   < > 6.3* 3.9* 3.8* 6.3* 8.2*  DDIMER 14.76*   < > 7.65* 7.18* 4.55* 5.15* 3.58*  PROCALCITON <0.10   < > <0.10 <0.10 <0.10 <0.10 <0.10  TSH 1.705  --   --   --   --   --   --   BNP 34.0   < > 18.3 51.9 20.5 32.6 32.8   < > = values in this interval not displayed.    ------------------------------------------------------------------------------------------------------------------ No results for input(s): CHOL, HDL, LDLCALC, TRIG, CHOLHDL, LDLDIRECT in the last 72 hours.  Lab Results  Component Value Date   HGBA1C 5.7 (H) 08/31/2020   ------------------------------------------------------------------------------------------------------------------ No results for input(s): TSH, T4TOTAL, T3FREE, THYROIDAB in the last 72 hours.  Invalid input(s): FREET3  Cardiac Enzymes No results for input(s): CKMB, TROPONINI, MYOGLOBIN in the last 168 hours.  Invalid input(s): CK ------------------------------------------------------------------------------------------------------------------    Component Value Date/Time   BNP 32.8 09/14/2020 0145    Micro Results Recent  Results (from the past 240 hour(s))  Expectorated sputum assessment w rflx to resp cult     Status: None   Collection Time: 09/09/20  1:52 PM   Specimen: Expectorated Sputum  Result Value Ref Range Status   Specimen Description EXPECTORATED SPUTUM  Final   Special Requests NONE  Final   Sputum evaluation   Final    Sputum specimen not acceptable for testing.  Please recollect.   Gram Stain Report Called to,Read Back By and Verified With: J. SCOTT RN, AT [phone] 09/11/20 BY Rush Landmark Performed at [facility], [place] [address], [place], [place] [address]    Report Status 09/11/2020 FINAL  Final    Radiology Reports CT ANGIO CHEST PE W OR WO CONTRAST  Result Date: 09/13/2020 CLINICAL DATA:  PE suspected COVID+ pt very SOB, rapid breathing during the scan EXAM: CT ANGIOGRAPHY CHEST WITH CONTRAST TECHNIQUE: Multidetector CT imaging of the chest was performed using the standard protocol during bolus administration of intravenous contrast. Multiplanar CT image reconstructions and MIPs were obtained to evaluate the vascular anatomy. CONTRAST:  69mL OMNIPAQUE IOHEXOL 350 MG/ML SOLN COMPARISON:  CT chest 08/30/2020 FINDINGS: Cardiovascular: Suboptimal opacification of the pulmonary arterial system. There are also extensive bilateral pulmonary opacities limiting evaluation beyond the segmental level. Within these limitations there are no filling defects. Normal caliber of the main pulmonary artery and thoracic aorta. Atherosclerotic calcification in the aortic arch. Mildly enlarged heart size. No pericardial effusion. Mediastinum/Nodes: There are prominent mediastinal and hilar lymph nodes similar to prior, likely reactive. Representative lymph node at the AP window measures 0.8  cm in short axis (series 5, image 31). No axillary adenopathy. Lungs/Pleura: Central airways are patent. There are extensive bilateral ground-glass and consolidative opacities involving the entirety of both lungs which are  increased compared to the prior study. There is a pneumatocele in the right lung measuring 2.0 cm (series 5, image 34). No pneumothorax or pleural effusion. Upper Abdomen: Multiple gallstones.  Otherwise unremarkable Musculoskeletal: No chest wall abnormality. No acute or significant osseous findings. Review of the MIP images confirms the above findings. IMPRESSION: 1. Suboptimal opacification of the pulmonary arterial system and extensive bilateral pulmonary opacities limiting evaluation. No evidence of thrombus to the level of the subsegmental pulmonary arteries. 2. Extensive bilateral ground-glass and consolidative opacities involving the entirety of both lungs which are increased compared to the prior study, consistent with history of COVID pneumonia. 3. Prominent mediastinal and hilar lymph nodes, similar to prior, likely reactive. 4. Cholelithiasis. 5. Aortic atherosclerosis. Aortic Atherosclerosis (ICD10-I70.0). Electronically Signed   By: Audie Pinto M.D.   On: 09/13/2020 13:59   CT ANGIO CHEST PE W OR WO CONTRAST  Result Date: 08/30/2020 CLINICAL DATA:  COVID-19 infection, now with cough, fever and shortness of breath for the past 2 days. Evaluate for pulmonary embolism. EXAM: CT ANGIOGRAPHY CHEST WITH CONTRAST TECHNIQUE: Multidetector CT imaging of the chest was performed using the standard protocol during bolus administration of intravenous contrast. Multiplanar CT image reconstructions and MIPs were obtained to evaluate the vascular anatomy. CONTRAST:  37mL OMNIPAQUE IOHEXOL 350 MG/ML SOLN COMPARISON:  Chest CT-08/29/2020; 01/12/2018; 05/24/2017 FINDINGS: Vascular Findings: There is suboptimal opacification of the pulmonary arterial system with the main pulmonary artery measuring only 195 Hounsfield units. Given this limitation, there are no discrete filling defects within the pulmonary arterial tree to the level the bilateral subsegmental pulmonary arteries. Evaluation of the distal  subsegmental pulmonary arteries is degraded secondary to a combination of suboptimal vessel opacification as well as patient respiratory artifact. Normal caliber of the main pulmonary artery. Cardiomegaly.  No pericardial effusion. Mild ectasia of the ascending thoracic aorta measuring 38 mm in diameter. Conventional configuration of the aortic arch. The descending thoracic aorta is of normal caliber and widely patent without hemodynamically significant narrowing. There is a minimal amount of atherosclerotic plaque involving the aortic arch and proximal descending thoracic aorta, not resulting in hemodynamically significant stenosis. Review of the MIP images confirms the above findings. ---------------------------------------------------------------------------------- Nonvascular Findings: Mediastinum/Lymph Nodes: Scattered prominent mediastinal and hilar lymph nodes are not enlarged by size criteria with index AP window lymph node measuring 0.9 cm in greatest short axis diameter (image 33, series 5) and index left infrahilar lymph node measuring 0.8 cm (image 43, series 5), presumably reactive in etiology. No bulky mediastinal, hilar or axillary lymphadenopathy. Lungs/Pleura: Extensive bilateral heterogeneous and consolidative airspace opacities involving near the entirety of the lungs bilaterally with associated scattered air bronchograms. No pleural effusion or pneumothorax. The central pulmonary airways appear widely patent. Upper abdomen: Limited arterial phase of the upper abdomen demonstrates multiple cholesterol containing gallstones within an otherwise normal-appearing gallbladder. Musculoskeletal: No acute or aggressive osseous abnormalities. Stigmata of dish within the thoracic spine. Regional soft tissues appear normal. Normal appearance of the thyroid gland. IMPRESSION: 1. No evidence of pulmonary embolism to the level the bilateral subsegmental pulmonary arteries. 2. Extensive heterogeneous and  consolidative airspace opacities involving near the entirety of the lungs bilaterally compatible with provided history of COVID 19 pneumonia. 3. Cardiomegaly. 4. Aortic Atherosclerosis (ICD10-I70.0). 5. Cholelithiasis. Electronically Signed   By: Sandi Mariscal  M.D.   On: 08/30/2020 09:00   DG Chest Port 1 View  Result Date: 09/09/2020 CLINICAL DATA:  COVID positive, shortness of breath EXAM: PORTABLE CHEST 1 VIEW COMPARISON:  09/03/2020 FINDINGS: Severe diffuse bilateral airspace disease, worsening since prior study. Heart is borderline in size. No visible effusions or pneumothorax. IMPRESSION: Severe diffuse bilateral airspace disease, worsening since prior study. Electronically Signed   By: Rolm Baptise M.D.   On: 09/09/2020 09:21   DG CHEST PORT 1 VIEW  Result Date: 09/03/2020 CLINICAL DATA:  Adult respiratory distress syndrome. Reported COVID-19 positive EXAM: PORTABLE CHEST 1 VIEW COMPARISON:  August 29, 2020 chest radiograph and chest CT August 30, 2020 FINDINGS: There is increased airspace opacity throughout the lungs bilaterally with a more even distribution of airspace opacity compared to prior studies. No consolidation. Heart is mildly enlarged with pulmonary vascularity normal. No adenopathy. No bone lesions. IMPRESSION: Persistent widespread airspace opacity bilaterally without areas of consolidation. Appearance consistent with multifocal atypical organism pneumonia. A degree of underlying ARDS may be present. Stable cardiac prominence. No adenopathy evident by radiography. Electronically Signed   By: Lowella Grip III M.D.   On: 09/03/2020 07:59   DG Chest Port 1 View  Result Date: 08/29/2020 CLINICAL DATA:  COVID-19 positive, hypoxia, cough EXAM: PORTABLE CHEST 1 VIEW COMPARISON:  01/12/2018 FINDINGS: Single frontal view of the chest demonstrates unremarkable cardiac silhouette. There is widespread bilateral airspace disease, without effusion or pneumothorax. No acute bony  abnormality. IMPRESSION: 1. Bilateral multifocal pneumonia compatible with COVID 19. Electronically Signed   By: Randa Ngo M.D.   On: 08/29/2020 22:54   VAS [place] LOWER EXTREMITY VENOUS (DVT)  Result Date: 09/13/2020  Lower Venous DVT Study Indications: Covid-19, D-Dimer.  Comparison Study: Prior negative study from 09/03/20 is available for comparison Performing Technologist: Sharion Dove RVS  Examination Guidelines: A complete evaluation includes B-mode imaging, spectral Doppler, color Doppler, and power Doppler as needed of all accessible portions of each vessel. Bilateral testing is considered an integral part of a complete examination. Limited examinations for reoccurring indications may be performed as noted. The reflux portion of the exam is performed with the patient in reverse Trendelenburg.  +---------+---------------+---------+-----------+----------+--------------+ RIGHT    CompressibilityPhasicitySpontaneityPropertiesThrombus Aging +---------+---------------+---------+-----------+----------+--------------+ CFV      Full           Yes      Yes                                 +---------+---------------+---------+-----------+----------+--------------+ SFJ      Full                                                        +---------+---------------+---------+-----------+----------+--------------+ FV Prox  Full                                                        +---------+---------------+---------+-----------+----------+--------------+ FV Mid   Full                                                        +---------+---------------+---------+-----------+----------+--------------+  FV DistalFull                                                        +---------+---------------+---------+-----------+----------+--------------+ PFV      Full                                                         +---------+---------------+---------+-----------+----------+--------------+ POP      Full           Yes      Yes                                 +---------+---------------+---------+-----------+----------+--------------+ PTV      Full                                                        +---------+---------------+---------+-----------+----------+--------------+ PERO     Full                                                        +---------+---------------+---------+-----------+----------+--------------+   +---------+---------------+---------+-----------+----------+--------------+ LEFT     CompressibilityPhasicitySpontaneityPropertiesThrombus Aging +---------+---------------+---------+-----------+----------+--------------+ CFV      Full           Yes      Yes                                 +---------+---------------+---------+-----------+----------+--------------+ SFJ      Full                                                        +---------+---------------+---------+-----------+----------+--------------+ FV Prox  Full                                                        +---------+---------------+---------+-----------+----------+--------------+ FV Mid   Full                                                        +---------+---------------+---------+-----------+----------+--------------+ FV DistalFull                                                        +---------+---------------+---------+-----------+----------+--------------+  PFV      Full                                                        +---------+---------------+---------+-----------+----------+--------------+ POP      Full           Yes      Yes                                 +---------+---------------+---------+-----------+----------+--------------+ PTV      Full                                                         +---------+---------------+---------+-----------+----------+--------------+ PERO     Full                                                        +---------+---------------+---------+-----------+----------+--------------+     Summary: BILATERAL: - No evidence of deep vein thrombosis seen in the lower extremities, bilaterally. - RIGHT: - Findings appear essentially unchanged compared to previous examination.  LEFT: - Findings appear essentially unchanged compared to previous examination.  *See table(s) above for measurements and observations. Electronically signed by Harold Barban MD on 09/13/2020 at 7:43:20 PM.    Final    VAS [place] LOWER EXTREMITY VENOUS (DVT)  Result Date: 09/03/2020  Lower Venous DVTStudy Indications: Edema.  Limitations: Patient positioning. Comparison Study: no prior Performing Technologist: Abram Sander RVS  Examination Guidelines: A complete evaluation includes B-mode imaging, spectral Doppler, color Doppler, and power Doppler as needed of all accessible portions of each vessel. Bilateral testing is considered an integral part of a complete examination. Limited examinations for reoccurring indications may be performed as noted. The reflux portion of the exam is performed with the patient in reverse Trendelenburg.  +---------+---------------+---------+-----------+----------+--------------+ RIGHT    CompressibilityPhasicitySpontaneityPropertiesThrombus Aging +---------+---------------+---------+-----------+----------+--------------+ CFV      Full           Yes      Yes                                 +---------+---------------+---------+-----------+----------+--------------+ SFJ      Full                                                        +---------+---------------+---------+-----------+----------+--------------+ FV Prox  Full                                                        +---------+---------------+---------+-----------+----------+--------------+  FV Mid   Full                                                        +---------+---------------+---------+-----------+----------+--------------+  FV DistalFull                                                        +---------+---------------+---------+-----------+----------+--------------+ PFV      Full                                                        +---------+---------------+---------+-----------+----------+--------------+ POP      Full           Yes      Yes                                 +---------+---------------+---------+-----------+----------+--------------+ PTV      Full                                                        +---------+---------------+---------+-----------+----------+--------------+ PERO     Full                                                        +---------+---------------+---------+-----------+----------+--------------+   +---------+---------------+---------+-----------+----------+--------------+ LEFT     CompressibilityPhasicitySpontaneityPropertiesThrombus Aging +---------+---------------+---------+-----------+----------+--------------+ CFV      Full           Yes      Yes                                 +---------+---------------+---------+-----------+----------+--------------+ SFJ      Full                                                        +---------+---------------+---------+-----------+----------+--------------+ FV Prox  Full                                                        +---------+---------------+---------+-----------+----------+--------------+ FV Mid   Full                                                        +---------+---------------+---------+-----------+----------+--------------+ FV DistalFull                                                        +---------+---------------+---------+-----------+----------+--------------+   PFV      Full                                                         +---------+---------------+---------+-----------+----------+--------------+ POP      Full           Yes      Yes                                 +---------+---------------+---------+-----------+----------+--------------+ PTV      Full                                                        +---------+---------------+---------+-----------+----------+--------------+ PERO                                                  Not visualized +---------+---------------+---------+-----------+----------+--------------+     Summary: BILATERAL: - No evidence of deep vein thrombosis seen in the lower extremities, bilaterally. - No evidence of superficial venous thrombosis in the lower extremities, bilaterally. -   *See table(s) above for measurements and observations. Electronically signed by Servando Snare MD on 09/03/2020 at 7:12:00 PM.    Final    ECHOCARDIOGRAM LIMITED  Result Date: 09/02/2020    ECHOCARDIOGRAM LIMITED REPORT   Patient Name:   Wendy Ruiz Date of Exam: 09/01/2020 Medical Rec #:  [id number]           Height:       64.0 in Accession #:    [id number]          Weight:       255.3 lb Date of Birth:  Jul 20, 1956           BSA:          2.170 m Patient Age:    34 years            BP:           122/79 mmHg Patient Gender: F                   HR:           97 bpm. Exam Location:  Inpatient Procedure: 2D Echo                                 MODIFIED REPORT: This report was modified by Sanda Klein MD on 09/02/2020 due to Corrected IVC                                    statement.  Indications:     786.09 dyspnea  History:         Patient has no prior history of Echocardiogram examinations.                  Risk Factors:Former Smoker. Covid +.  Sonographer:     Jannett Celestine RDCS (AE) Referring Phys:  [id number] [place] Clam Diagnosing Phys: Sanda Klein MD IMPRESSIONS  1. Left ventricular ejection fraction, by estimation, is 60 to 65%. The left ventricle has  normal function. The left ventricle has no regional wall motion abnormalities. Left ventricular diastolic parameters were normal.  2. Right ventricular systolic function is normal. The right ventricular size is normal. There is severely elevated pulmonary artery systolic pressure.  3. The mitral valve is normal in structure. No evidence of mitral valve regurgitation. No evidence of mitral stenosis.  4. The aortic valve is normal in structure. Aortic valve regurgitation is not visualized. No aortic stenosis is present.  5. The inferior vena cava is dilated in size with <50% respiratory variability, suggesting right atrial pressure of 15 mmHg. FINDINGS  Left Ventricle: Left ventricular ejection fraction, by estimation, is 60 to 65%. The left ventricle has normal function. The left ventricle has no regional wall motion abnormalities. The left ventricular internal cavity size was normal in size. There is  no left ventricular hypertrophy. Left ventricular diastolic parameters were normal. Right Ventricle: The right ventricular size is normal. No increase in right ventricular wall thickness. Right ventricular systolic function is normal. There is severely elevated pulmonary artery systolic pressure. The tricuspid regurgitant velocity is 3.43 m/s, and with an assumed right atrial pressure of 15 mmHg, the estimated right ventricular systolic pressure is 01.0 mmHg. Left Atrium: Left atrial size was normal in size. Right Atrium: Right atrial size was normal in size. Pericardium: There is no evidence of pericardial effusion. Mitral Valve: The mitral valve is normal in structure. No evidence of mitral valve stenosis. Tricuspid Valve: The tricuspid valve is normal in structure. Tricuspid valve regurgitation is trivial. No evidence of tricuspid stenosis. Aortic Valve: The aortic valve is normal in structure. Aortic valve regurgitation is not visualized. No aortic stenosis is present. Pulmonic Valve: The pulmonic valve was normal  in structure. Pulmonic valve regurgitation is not visualized. No evidence of pulmonic stenosis. Aorta: The aortic root is normal in size and structure. Venous: The inferior vena cava is dilated in size with less than 50% respiratory variability, suggesting right atrial pressure of 15 mmHg. IAS/Shunts: No atrial level shunt detected by color flow Doppler. LEFT VENTRICLE PLAX 2D LVIDd:         4.25 cm Diastology LVIDs:         3.06 cm LV e' medial:    8.38 cm/s LV PW:         0.96 cm LV E/e' medial:  8.9 LV IVS:        0.93 cm LV e' lateral:   12.40 cm/s                        LV E/e' lateral: 6.0  MITRAL VALVE               TRICUSPID VALVE MV Area (PHT): 4.26 cm    TR Peak grad:   47.1 mmHg MV Decel Time: 178 msec    TR Vmax:        343.00 cm/s MV E velocity: 74.55 cm/s MV A velocity: 68.98 cm/s MV E/A ratio:  1.08 Mihai Croitoru MD Electronically signed by Sanda Klein MD Signature Date/Time: 09/01/2020/3:40:50 PM    Final (Updated)

## 2020-09-14 NOTE — Progress Notes (Signed)
Physical Therapy Treatment Patient Details Name: Wendy Ruiz MRN: [id number] DOB: [birth year] Today's Date: 09/14/2020    History of Present Illness Pt is a 64 y.o. female admitted 08/29/20 not feeling well, apparently had syncopal episode and woke up to EMS present for hospital transport. CXR with bilateral infiltrates; pt hypoxic requiring NRB. PMH includes Bipolar 1, chronic pain, obesity.    PT Comments    Continuing work on functional mobility and activity tolerance;   Session focused on functional transfers, including multiple sit<>stands and transfer to/from [facility] Continues to have a hefty supplemental O2 requirement, and desats with activity; Still much less anxious today, and able to participate with adequate breaks to monitor her recovery; She was independent prior to this hospitalization, and it might be worth considering a CIR stay post-acutely to maximize independence and safety with mobility prior to dc home  Session conducted on supplemental O2 via NRB and HHFNC 10L; Desatted quickly with standing/transfer activity; would desat as low as 81-84%; and recover to 90s with at least 5 min of seated rest; HR up to 120s with activity  Follow Up Recommendations  Home health PT;Supervision for mobility/OOB (may need to consider CIR; 21st day =10/9)     Equipment Recommendations  Other (comment);Rolling walker with 5" wheels (further discernment TBD)    Recommendations for Other Services       Precautions / Restrictions Precautions Precautions: Fall;Other (comment) Precaution Comments: Watch SpO2, RR    Mobility  Bed Mobility               General bed mobility comments: Recieved pt OOB in recliner  Transfers Overall transfer level: Needs assistance Equipment used: None Transfers: Sit to/from [facility] Sit to Stand: Min guard         General transfer comment: Minguard A for safety/multiple lines/HHFNC+NRB; Cues to self-monitor for activity  tolerance; Transferred to [facility] with minguard A  Ambulation/Gait                 Stairs             Wheelchair Mobility    Modified Rankin (Stroke Patients Only)       Balance     Sitting balance-Leahy Scale: Good       Standing balance-Leahy Scale: Fair                              Cognition Arousal/Alertness: Awake/alert Behavior During Therapy: WFL for tasks assessed/performed Overall Cognitive Status: Within Functional Limits for tasks assessed                                        Exercises      General Comments General comments (skin integrity, edema, etc.): Session conducted on supplemental O2 via NRB and HHFNC 10L; Desatted quickly with standing/transfer activity; would desat as low as 81-84%; and recover to 90s with at least 5 min of seated rest; HR up to 120s with activity      Pertinent Vitals/Pain Pain Assessment: Faces Faces Pain Scale: Hurts a little bit Pain Location: Grimacing with coughing Pain Descriptors / Indicators: Grimacing Pain Intervention(s): Monitored during session    Home Living                      Prior Function  PT Goals (current goals can now be found in the care plan section) Acute Rehab PT Goals Patient Stated Goal: "I know it's going to be a process, but I hope to get through this" PT Goal Formulation: With patient Time For Goal Achievement: 09/23/20 Potential to Achieve Goals: Good Progress towards PT goals: Progressing toward goals (though very slowly -- continues to be limited by dyspnea)    Frequency    Min 3X/week      PT Plan Current plan remains appropriate (however watching closely, may need CIR)    Co-evaluation              AM-PAC PT "6 Clicks" Mobility   Outcome Measure  Help needed turning from your back to your side while in a flat bed without using bedrails?: None Help needed moving from lying on your back to sitting on the side  of a flat bed without using bedrails?: None Help needed moving to and from a bed to a chair (including a wheelchair)?: A Little Help needed standing up from a chair using your arms (e.g., wheelchair or bedside chair)?: A Little Help needed to walk in hospital room?: A Little Help needed climbing 3-5 steps with a railing? : A Lot 6 Click Score: 19    End of Session Equipment Utilized During Treatment: Oxygen Activity Tolerance: Patient limited by fatigue Patient left: in chair;with call bell/phone within reach Nurse Communication: Mobility status PT Visit Diagnosis: Other abnormalities of gait and mobility (R26.89)     Time: 6213-0865 PT Time Calculation (min) (ACUTE ONLY): 36 min  Charges:  $Therapeutic Activity: 23-37 mins                     Roney Marion, PT  Acute Rehabilitation Services Pager [phone] Office [phone]    Colletta [place] 09/14/2020, 2:49 PM

## 2020-09-15 ENCOUNTER — Inpatient Hospital Stay (HOSPITAL_COMMUNITY): Payer: HRSA Program

## 2020-09-15 DIAGNOSIS — U071 COVID-19: Secondary | ICD-10-CM | POA: Diagnosis not present

## 2020-09-15 DIAGNOSIS — J9601 Acute respiratory failure with hypoxia: Secondary | ICD-10-CM | POA: Diagnosis not present

## 2020-09-15 DIAGNOSIS — J80 Acute respiratory distress syndrome: Secondary | ICD-10-CM | POA: Diagnosis not present

## 2020-09-15 LAB — C-REACTIVE PROTEIN: CRP: 7.7 mg/dL — ABNORMAL HIGH (ref <1.0)

## 2020-09-15 LAB — CBC WITH DIFFERENTIAL/PLATELET
Abs Immature Granulocytes: 0.18 10*3/uL — ABNORMAL HIGH (ref 0.00–0.07)
Basophils Absolute: 0 10*3/uL (ref 0.0–0.1)
Basophils Relative: 0 %
Eosinophils Absolute: 0.1 10*3/uL (ref 0.0–0.5)
Eosinophils Relative: 1 %
HCT: 37.5 % (ref 36.0–46.0)
Hemoglobin: 11 g/dL — ABNORMAL LOW (ref 12.0–15.0)
Immature Granulocytes: 1 %
Lymphocytes Relative: 7 %
Lymphs Abs: 1.5 10*3/uL (ref 0.7–4.0)
MCH: 26.1 pg (ref 26.0–34.0)
MCHC: 29.3 g/dL — ABNORMAL LOW (ref 30.0–36.0)
MCV: 89.1 fL (ref 80.0–100.0)
Monocytes Absolute: 1 10*3/uL (ref 0.1–1.0)
Monocytes Relative: 5 %
Neutro Abs: 17.8 10*3/uL — ABNORMAL HIGH (ref 1.7–7.7)
Neutrophils Relative %: 86 %
Platelets: 269 10*3/uL (ref 150–400)
RBC: 4.21 MIL/uL (ref 3.87–5.11)
RDW: 13.4 % (ref 11.5–15.5)
WBC: 20.6 10*3/uL — ABNORMAL HIGH (ref 4.0–10.5)
nRBC: 0 % (ref 0.0–0.2)

## 2020-09-15 LAB — COMPREHENSIVE METABOLIC PANEL
ALT: 284 U/L — ABNORMAL HIGH (ref 0–44)
AST: 94 U/L — ABNORMAL HIGH (ref 15–41)
Albumin: 2.8 g/dL — ABNORMAL LOW (ref 3.5–5.0)
Alkaline Phosphatase: 75 U/L (ref 38–126)
Anion gap: 12 (ref 5–15)
BUN: 14 mg/dL (ref 8–23)
CO2: 27 mmol/L (ref 22–32)
Calcium: 9.2 mg/dL (ref 8.9–10.3)
Chloride: 99 mmol/L (ref 98–111)
Creatinine, Ser: 0.73 mg/dL (ref 0.44–1.00)
GFR calc Af Amer: >60 mL/min (ref >60)
GFR calc non Af Amer: >60 mL/min (ref >60)
Glucose, Bld: 95 mg/dL (ref 70–99)
Potassium: 4 mmol/L (ref 3.5–5.1)
Sodium: 138 mmol/L (ref 135–145)
Total Bilirubin: 0.9 mg/dL (ref 0.3–1.2)
Total Protein: 7 g/dL (ref 6.5–8.1)

## 2020-09-15 LAB — GLUCOSE, CAPILLARY
Glucose-Capillary: 89 mg/dL (ref 70–99)
Glucose-Capillary: 91 mg/dL (ref 70–99)
Glucose-Capillary: 113 mg/dL — ABNORMAL HIGH (ref 70–99)
Glucose-Capillary: 195 mg/dL — ABNORMAL HIGH (ref 70–99)
Glucose-Capillary: 175 mg/dL — ABNORMAL HIGH (ref 70–99)
Glucose-Capillary: 267 mg/dL — ABNORMAL HIGH (ref 70–99)

## 2020-09-15 LAB — D-DIMER, QUANTITATIVE: D-Dimer, Quant: 3.51 ug/mL-FEU — ABNORMAL HIGH (ref 0.00–0.50)

## 2020-09-15 LAB — BRAIN NATRIURETIC PEPTIDE: B Natriuretic Peptide: 45.7 pg/mL (ref 0.0–100.0)

## 2020-09-15 LAB — MAGNESIUM: Magnesium: 2.2 mg/dL (ref 1.7–2.4)

## 2020-09-15 LAB — PROCALCITONIN: Procalcitonin: <0.1 ng/mL

## 2020-09-15 MED ORDER — PROSOURCE PLUS PO LIQD
30.0000 mL | Freq: Two times a day (BID) | ORAL | Status: DC
  Administered 2020-09-15 – 2020-09-29 (×25): 30 mL via ORAL
  Filled 2020-09-15 (×26): qty 30

## 2020-09-15 NOTE — Progress Notes (Signed)
OT Cancellation Note  Patient Details Name: LIZZA HUFFAKER MRN: [id number] DOB: 10-01-1956   Cancelled Treatment:    Reason Eval/Treat Not Completed: Other (comment) (On the phone (business call). Will return as schedule allows).  [place], OTR/L Acute Rehab Pager: [phone] Office: [phone] 09/15/2020, 4:42 PM

## 2020-09-15 NOTE — Progress Notes (Signed)
Nutrition Follow-up  DOCUMENTATION CODES:   Obesity unspecified  INTERVENTION:  Continue Ensure Enlive po TID, each supplement provides 350 kcal and 20 grams of protein.  Provide 30 ml Prosource Plus po BID, each supplement provides 100 kcal and 15 grams of protein.   Provide Magic cup TID with meals, each supplement provides 290 kcal and 9 grams of protein  Encourage adequate PO intake.   NUTRITION DIAGNOSIS:   Increased nutrient needs related to acute illness (COVID PNA) as evidenced by estimated needs; ongoing  GOAL:   Patient will meet greater than or equal to 90% of their needs; progressing  MONITOR:   PO intake, Supplement acceptance, Weight trends, Labs, I & O's  REASON FOR ASSESSMENT:   Rounds    ASSESSMENT:   Patient with PMH significant for bipolar disorder and GERD. Presents this admission with COVID PNA.  Pt is currently on 20 L HFNC via non-rebreather mask. Meal completion has been 25-50%. Pt currently has Ensure ordered TID and has been consuming them. RD to additionally order Prosource plus to aid in protein needs. Pt encouraged to eat her food at meals and to drink her supplements.    Labs and medications reviewed.   Diet Order:   Diet Order            Diet Carb Modified Fluid consistency: Thin; Room service appropriate? Yes  Diet effective now                 EDUCATION NEEDS:   Not appropriate for education at this time  Skin:  Skin Assessment: Reviewed RN Assessment  Last BM:  10/3  Height:   Ht Readings from Last 1 Encounters:  08/29/20 5\' 4"  (1.626 m)    Weight:   Wt Readings from Last 1 Encounters:  09/07/20 108.5 kg    BMI:  Body mass index is 41.06 kg/m.  Estimated Nutritional Needs:   Kcal:  2751-7001 kcal  Protein:  109-136 g  Fluid:  >/= 2 L/day  Wendy Parker, MS, RD, LDN RD pager number/after hours weekend pager number on Amion.

## 2020-09-15 NOTE — Progress Notes (Signed)
Rehab Admissions Coordinator Note:  Patient was screened by Cleatrice Burke for appropriateness for an Inpatient Acute Rehab Consult per therapy change in recommendations. Noted COVID + 08/29/20 Patients are eligible to be considered for admit to the [facility] when cleared from airborne precautions by acute MD or Infectious disease. Otherwise they will need to be >20 days from their positive test with recovery/improvement in symptoms ( 10/9) or 2 negative tests. Please call me with any questions. I will follow.   Danne Baxter, RN, MSN Rehab Admissions Coordinator [phone] 08/18/2020 12:39 PM

## 2020-09-15 NOTE — Progress Notes (Signed)
PROGRESS NOTE                                                                                                                                                                                                             Patient Demographics:    Wendy Ruiz, is a 64 y.o. female, DOB - [birth year], OPF:[id number]  Outpatient Primary MD for the patient is Lyndee Hensen, DO    LOS - 58  Admit date - 08/29/2020    Chief Complaint  Patient presents with   Shortness of Breath       Brief Narrative  - 64 year old African-American female with past medical history of bipolar disorder, obesity, GERD who was admitted to the hospital on 08/29/2020 with severe acute hypoxic respiratory failure due to COVID-19 pneumonia, unfortunately patient is not vaccinated.  She was under the care of ICU and was BiPAP dependent for a while.  She has been transferred to hospitalist service on 09/09/2020.  Currently on 50 L of heated high flow at 100% FiO2 along with nonrebreather mask.   Subjective:   Patient in bed, appears comfortable, denies any headache, no fever, no chest pain or pressure, improving shortness of breath , no abdominal pain. No focal weakness.   Assessment  & Plan :     1. Acute Hypoxic Resp. Failure due to Acute Covid 19 Viral Pneumonitis during the ongoing 2020 Covid 19 Pandemic - she is unfortunately not vaccinated and has incurred severe parenchymal lung injury, she is being treated with combination of remdesivir, IV steroids and Baricitinib.  Was under the care of ICU and transferred to hospitalist service on 09/09/2020, she is currently on 20 L heated high flow and 100% FiO2 +/- nonrebreather PRN (down from 50 + NRB).  Due to worsening leukocytosis and oxygen demands she has been placed on 5 days of oral Levaquin starting on 10/07/2020 with some improvement, continue to monitor, remains extremely guarded.  Encouraged the  patient to sit up in chair in the daytime use I-S and flutter valve for pulmonary toiletry and then prone in bed when at night.  Will advance activity and titrate down oxygen as possible.   SpO2: 99 % O2 Flow Rate (L/min): (S) 20 L/min FiO2 (%): 100 %  Recent Labs  Lab 09/11/20 1116 09/12/20 0351 09/13/20 0515 09/14/20 0145 09/15/20 0408  WBC  25.6* 16.6* 21.2* 18.6* 20.6*  PLT 455* 388 359 335 269  CRP 3.9* 3.8* 6.3* 8.2* 7.7*  BNP 51.9 20.5 32.6 32.8 45.7  DDIMER 7.18* 4.55* 5.15* 3.58* 3.51*  PROCALCITON <0.10 <0.10 <0.10 <0.10 <0.10  AST 82* 82* 110* 115* 94*  ALT 143* 157* 233* 277* 284*  ALKPHOS 68 61 70 69 75  BILITOT 0.6 0.6 0.8 0.7 0.9  ALBUMIN 2.5* 2.2* 2.4* 2.4* 2.8*     2.  Extremely elevated D-dimer.  Full dose Lovenox. CTA x 2 and Leg [place] x 2 are -ve, risk of developing a blood clot is high continue full dose Lovenox till D-dimer drops below 3.   3.  Intermediate quanteferon checked in ICU.  Outpatient monitoring.  CTA does not show any signs of active tuberculosis, patient follow-up with ID post discharge.  4.  GERD.  PPI.  6.  Mild tachycardia.  Stable TSH, CTA -ve.    Condition - Extremely Guarded  Family Communication  :   Daughter [name] [phone] on 09/09/2020 updated, message left on 09/10/2020 at 11:19 AM, message left 09/11/2020 at 8:05 AM, updated 09/13/2020  Daughter Joi [phone] on 09/09/20 updated,    Code Status : Full  Consults  :  PCCM  Procedures  :    CTA 9/19 with no PE, near confluent dense groundglass opacities in all lobes of bilateral lungs 09/02/2019 one 2D echo essentially unremarkable  923 lower extremity Doppler studies-> neg 10/2 - repeat duplex  -negative 10/3 - CTA - no PE   PUD Prophylaxis : PPI  Disposition Plan  :    Status is: Inpatient  Remains inpatient appropriate because:IV treatments appropriate due to intensity of illness or inability to take PO   Dispo: The patient is from: Home               Anticipated d/c is to: Home              Anticipated d/c date is: > 3 days              Patient currently is not medically stable to d/c.   DVT Prophylaxis  :  Lovenox   Lab Results  Component Value Date   PLT 269 09/15/2020    Diet :  Diet Order            Diet Carb Modified Fluid consistency: Thin; Room service appropriate? Yes  Diet effective now                  Inpatient Medications  Scheduled Meds:  Chlorhexidine Gluconate Cloth  6 each Topical Daily   enoxaparin (LOVENOX) injection  110 mg Subcutaneous Q12H   feeding supplement (ENSURE ENLIVE)  237 mL Oral TID   insulin aspart  0-15 Units Subcutaneous Q4H   insulin detemir  10 Units Subcutaneous BID   mouth rinse  15 mL Mouth Rinse BID   methylPREDNISolone (SOLU-MEDROL) injection  40 mg Intravenous Daily   metoprolol tartrate  12.5 mg Oral BID   pantoprazole  40 mg Oral Q1200   polyethylene glycol  17 g Oral BID   sodium chloride flush  10-40 mL Intracatheter Q12H   Continuous Infusions:  sodium chloride 10 mL/hr at 09/07/20 2000   PRN Meds:.sodium chloride, acetaminophen, guaiFENesin-dextromethorphan, lip balm, naphazoline-glycerin, phenol, sodium chloride  Antibiotics  :    Anti-infectives (From admission, onward)   Start     Dose/Rate Route Frequency Ordered Stop   09/11/20 1100  levofloxacin (LEVAQUIN) tablet  750 mg        750 mg Oral Daily 09/11/20 1024 09/12/20 0941   09/09/20 1415  levofloxacin (LEVAQUIN) tablet 750 mg        750 mg Oral Daily 09/09/20 1351 09/11/20 0907   09/03/20 1115  ceFEPIme (MAXIPIME) 2 g in sodium chloride 0.9 % 100 mL IVPB  Status:  Discontinued        2 g 200 mL/hr over 30 Minutes Intravenous Every 8 hours 09/03/20 1104 09/08/20 1140   09/03/20 1100  azithromycin (ZITHROMAX) 500 mg in sodium chloride 0.9 % 250 mL IVPB  Status:  Discontinued        500 mg 250 mL/hr over 60 Minutes Intravenous Every 24 hours 09/03/20 1052 09/04/20 0854   08/31/20 1000   remdesivir 100 mg in sodium chloride 0.9 % 100 mL IVPB       "Followed by" Linked Group Details   100 mg 200 mL/hr over 30 Minutes Intravenous Daily 08/30/20 0306 09/03/20 1000   08/30/20 0400  remdesivir 200 mg in sodium chloride 0.9% 250 mL IVPB       "Followed by" Linked Group Details   200 mg 580 mL/hr over 30 Minutes Intravenous Once 08/30/20 0306 08/30/20 0647       Time Spent in minutes  30   Lala Lund M.D on 09/15/2020 at 11:31 AM  To page go to [email or web address] - password [facility]  [facility] -  Office  [phone]     See all Orders from today for further details    Objective:   Vitals:   09/15/20 0146 09/15/20 0807 09/15/20 0846 09/15/20 0954  BP:  114/77    Pulse: 98 99    Resp: (!) 25 (!) 24    Temp:  98.8 F (37.1 C)    TempSrc:  Axillary    SpO2: 91% 96% (!) 79% 99%  Weight:      Height:        Wt Readings from Last 3 Encounters:  09/07/20 108.5 kg  05/14/13 102.5 kg  07/15/12 95.3 kg     Intake/Output Summary (Last 24 hours) at 09/15/2020 1131 Last data filed at 09/14/2020 2203 Gross per 24 hour  Intake 240 ml  Output --  Net 240 ml     Physical Exam  Awake Alert, No new F.N deficits, Normal affect [place].AT,PERRAL Supple Neck,No JVD, No cervical lymphadenopathy appriciated.  Symmetrical Chest wall movement, Good air movement bilaterally, few rales RRR,No Gallops, Rubs or new Murmurs, No Parasternal Heave +ve B.Sounds, Abd Soft, No tenderness, No organomegaly appriciated, No rebound - guarding or rigidity. No Cyanosis, Clubbing or edema, No new Rash or bruise    Data Review:    CBC Recent Labs  Lab 09/11/20 1116 09/12/20 0351 09/13/20 0515 09/14/20 0145 09/15/20 0408  WBC 25.6* 16.6* 21.2* 18.6* 20.6*  HGB 10.8* 9.8* 10.2* 10.3* 11.0*  HCT 35.5* 32.1* 34.3* 34.0* 37.5  PLT 455* 388 359 335 269  MCV 86.8 87.5 88.2 88.3 89.1  MCH 26.4 26.7 26.2 26.8 26.1  MCHC 30.4 30.5 29.7* 30.3 29.3*  RDW 12.7 12.8 13.0 13.1 13.4   LYMPHSABS 0.3* 1.7 1.6 1.3 1.5  MONOABS 0.8 0.9 0.9 0.7 1.0  EOSABS 0.0 0.0 0.1 0.0 0.1  BASOSABS 0.0 0.0 0.0 0.0 0.0    Recent Labs  Lab 09/09/20 0906 09/10/20 1056 09/11/20 1116 09/12/20 0351 09/13/20 0515 09/14/20 0145 09/15/20 0408  NA 137   < > 136 138 136 137 138  K 4.0   < >  4.0 3.8 3.8 4.1 4.0  CL 100   < > 98 102 101 100 99  CO2 24   < > 26 25 27 26 27   GLUCOSE 145*   < > 174* 78 121* 136* 95  BUN 17   < > 20 16 13 14 14   CREATININE 0.88   < > 0.85 0.75 0.77 0.79 0.73  CALCIUM 9.0   < > 9.0 8.8* 8.9 8.9 9.2  AST  --    < > 82* 82* 110* 115* 94*  ALT  --    < > 143* 157* 233* 277* 284*  ALKPHOS  --    < > 68 61 70 69 75  BILITOT  --    < > 0.6 0.6 0.8 0.7 0.9  ALBUMIN  --    < > 2.5* 2.2* 2.4* 2.4* 2.8*  MG 2.2   < > 2.2 2.2 2.2 2.3 2.2  CRP 8.0*   < > 3.9* 3.8* 6.3* 8.2* 7.7*  DDIMER 14.76*   < > 7.18* 4.55* 5.15* 3.58* 3.51*  PROCALCITON <0.10   < > <0.10 <0.10 <0.10 <0.10 <0.10  TSH 1.705  --   --   --   --   --   --   BNP 34.0   < > 51.9 20.5 32.6 32.8 45.7   < > = values in this interval not displayed.    ------------------------------------------------------------------------------------------------------------------ No results for input(s): CHOL, HDL, LDLCALC, TRIG, CHOLHDL, LDLDIRECT in the last 72 hours.  Lab Results  Component Value Date   HGBA1C 5.7 (H) 08/31/2020   ------------------------------------------------------------------------------------------------------------------ No results for input(s): TSH, T4TOTAL, T3FREE, THYROIDAB in the last 72 hours.  Invalid input(s): FREET3  Cardiac Enzymes No results for input(s): CKMB, TROPONINI, MYOGLOBIN in the last 168 hours.  Invalid input(s): CK ------------------------------------------------------------------------------------------------------------------    Component Value Date/Time   BNP 45.7 09/15/2020 0408    Micro Results Recent Results (from the past 240 hour(s))  Expectorated  sputum assessment w rflx to resp cult     Status: None   Collection Time: 09/09/20  1:52 PM   Specimen: Expectorated Sputum  Result Value Ref Range Status   Specimen Description EXPECTORATED SPUTUM  Final   Special Requests NONE  Final   Sputum evaluation   Final    Sputum specimen not acceptable for testing.  Please recollect.   Gram Stain Report Called to,Read Back By and Verified With: J. SCOTT RN, AT [phone] 09/11/20 BY Rush Landmark Performed at [facility], [place] [address] [place],  [address]    Report Status 09/11/2020 FINAL  Final    Radiology Reports CT ANGIO CHEST PE W OR WO CONTRAST  Result Date: 09/13/2020 CLINICAL DATA:  PE suspected COVID+ pt very SOB, rapid breathing during the scan EXAM: CT ANGIOGRAPHY CHEST WITH CONTRAST TECHNIQUE: Multidetector CT imaging of the chest was performed using the standard protocol during bolus administration of intravenous contrast. Multiplanar CT image reconstructions and MIPs were obtained to evaluate the vascular anatomy. CONTRAST:  56mL OMNIPAQUE IOHEXOL 350 MG/ML SOLN COMPARISON:  CT chest 08/30/2020 FINDINGS: Cardiovascular: Suboptimal opacification of the pulmonary arterial system. There are also extensive bilateral pulmonary opacities limiting evaluation beyond the segmental level. Within these limitations there are no filling defects. Normal caliber of the main pulmonary artery and thoracic aorta. Atherosclerotic calcification in the aortic arch. Mildly enlarged heart size. No pericardial effusion. Mediastinum/Nodes: There are prominent mediastinal and hilar lymph nodes similar to prior, likely reactive. Representative lymph node at  the AP window measures 0.8 cm in short axis (series 5, image 31). No axillary adenopathy. Lungs/Pleura: Central airways are patent. There are extensive bilateral ground-glass and consolidative opacities involving the entirety of both lungs which are increased compared to the prior study. There is a  pneumatocele in the right lung measuring 2.0 cm (series 5, image 34). No pneumothorax or pleural effusion. Upper Abdomen: Multiple gallstones.  Otherwise unremarkable Musculoskeletal: No chest wall abnormality. No acute or significant osseous findings. Review of the MIP images confirms the above findings. IMPRESSION: 1. Suboptimal opacification of the pulmonary arterial system and extensive bilateral pulmonary opacities limiting evaluation. No evidence of thrombus to the level of the subsegmental pulmonary arteries. 2. Extensive bilateral ground-glass and consolidative opacities involving the entirety of both lungs which are increased compared to the prior study, consistent with history of COVID pneumonia. 3. Prominent mediastinal and hilar lymph nodes, similar to prior, likely reactive. 4. Cholelithiasis. 5. Aortic atherosclerosis. Aortic Atherosclerosis (ICD10-I70.0). Electronically Signed   By: Audie Pinto M.D.   On: 09/13/2020 13:59   CT ANGIO CHEST PE W OR WO CONTRAST  Result Date: 08/30/2020 CLINICAL DATA:  COVID-19 infection, now with cough, fever and shortness of breath for the past 2 days. Evaluate for pulmonary embolism. EXAM: CT ANGIOGRAPHY CHEST WITH CONTRAST TECHNIQUE: Multidetector CT imaging of the chest was performed using the standard protocol during bolus administration of intravenous contrast. Multiplanar CT image reconstructions and MIPs were obtained to evaluate the vascular anatomy. CONTRAST:  101mL OMNIPAQUE IOHEXOL 350 MG/ML SOLN COMPARISON:  Chest CT-08/29/2020; 01/12/2018; 05/24/2017 FINDINGS: Vascular Findings: There is suboptimal opacification of the pulmonary arterial system with the main pulmonary artery measuring only 195 Hounsfield units. Given this limitation, there are no discrete filling defects within the pulmonary arterial tree to the level the bilateral subsegmental pulmonary arteries. Evaluation of the distal subsegmental pulmonary arteries is degraded secondary to a  combination of suboptimal vessel opacification as well as patient respiratory artifact. Normal caliber of the main pulmonary artery. Cardiomegaly.  No pericardial effusion. Mild ectasia of the ascending thoracic aorta measuring 38 mm in diameter. Conventional configuration of the aortic arch. The descending thoracic aorta is of normal caliber and widely patent without hemodynamically significant narrowing. There is a minimal amount of atherosclerotic plaque involving the aortic arch and proximal descending thoracic aorta, not resulting in hemodynamically significant stenosis. Review of the MIP images confirms the above findings. ---------------------------------------------------------------------------------- Nonvascular Findings: Mediastinum/Lymph Nodes: Scattered prominent mediastinal and hilar lymph nodes are not enlarged by size criteria with index AP window lymph node measuring 0.9 cm in greatest short axis diameter (image 33, series 5) and index left infrahilar lymph node measuring 0.8 cm (image 43, series 5), presumably reactive in etiology. No bulky mediastinal, hilar or axillary lymphadenopathy. Lungs/Pleura: Extensive bilateral heterogeneous and consolidative airspace opacities involving near the entirety of the lungs bilaterally with associated scattered air bronchograms. No pleural effusion or pneumothorax. The central pulmonary airways appear widely patent. Upper abdomen: Limited arterial phase of the upper abdomen demonstrates multiple cholesterol containing gallstones within an otherwise normal-appearing gallbladder. Musculoskeletal: No acute or aggressive osseous abnormalities. Stigmata of dish within the thoracic spine. Regional soft tissues appear normal. Normal appearance of the thyroid gland. IMPRESSION: 1. No evidence of pulmonary embolism to the level the bilateral subsegmental pulmonary arteries. 2. Extensive heterogeneous and consolidative airspace opacities involving near the entirety of  the lungs bilaterally compatible with provided history of COVID 19 pneumonia. 3. Cardiomegaly. 4. Aortic Atherosclerosis (ICD10-I70.0). 5. Cholelithiasis. Electronically Signed  By: Sandi Mariscal M.D.   On: 08/30/2020 09:00   DG Chest Port 1 View  Result Date: 09/15/2020 CLINICAL DATA:  Shortness of breath, COVID positive EXAM: PORTABLE CHEST 1 VIEW COMPARISON:  Chest radiograph dated 09/09/2020 and CT chest dated 09/13/2020. FINDINGS: The heart is borderline enlarged, unchanged. Moderate diffuse bilateral interstitial and airspace opacities have decreased since prior exam. There is no pleural effusion or pneumothorax. The osseous structures are unremarkable. IMPRESSION: Moderate diffuse bilateral interstitial and airspace opacities have decreased since prior exam. Electronically Signed   By: Zerita Boers M.D.   On: 09/15/2020 09:00   DG Chest Port 1 View  Result Date: 09/09/2020 CLINICAL DATA:  COVID positive, shortness of breath EXAM: PORTABLE CHEST 1 VIEW COMPARISON:  09/03/2020 FINDINGS: Severe diffuse bilateral airspace disease, worsening since prior study. Heart is borderline in size. No visible effusions or pneumothorax. IMPRESSION: Severe diffuse bilateral airspace disease, worsening since prior study. Electronically Signed   By: Rolm Baptise M.D.   On: 09/09/2020 09:21   DG CHEST PORT 1 VIEW  Result Date: 09/03/2020 CLINICAL DATA:  Adult respiratory distress syndrome. Reported COVID-19 positive EXAM: PORTABLE CHEST 1 VIEW COMPARISON:  August 29, 2020 chest radiograph and chest CT August 30, 2020 FINDINGS: There is increased airspace opacity throughout the lungs bilaterally with a more even distribution of airspace opacity compared to prior studies. No consolidation. Heart is mildly enlarged with pulmonary vascularity normal. No adenopathy. No bone lesions. IMPRESSION: Persistent widespread airspace opacity bilaterally without areas of consolidation. Appearance consistent with multifocal  atypical organism pneumonia. A degree of underlying ARDS may be present. Stable cardiac prominence. No adenopathy evident by radiography. Electronically Signed   By: Lowella Grip III M.D.   On: 09/03/2020 07:59   DG Chest Port 1 View  Result Date: 08/29/2020 CLINICAL DATA:  COVID-19 positive, hypoxia, cough EXAM: PORTABLE CHEST 1 VIEW COMPARISON:  01/12/2018 FINDINGS: Single frontal view of the chest demonstrates unremarkable cardiac silhouette. There is widespread bilateral airspace disease, without effusion or pneumothorax. No acute bony abnormality. IMPRESSION: 1. Bilateral multifocal pneumonia compatible with COVID 19. Electronically Signed   By: Randa Ngo M.D.   On: 08/29/2020 22:54   VAS [place] LOWER EXTREMITY VENOUS (DVT)  Result Date: 09/13/2020  Lower Venous DVT Study Indications: Covid-19, D-Dimer.  Comparison Study: Prior negative study from 09/03/20 is available for comparison Performing Technologist: Sharion Dove RVS  Examination Guidelines: A complete evaluation includes B-mode imaging, spectral Doppler, color Doppler, and power Doppler as needed of all accessible portions of each vessel. Bilateral testing is considered an integral part of a complete examination. Limited examinations for reoccurring indications may be performed as noted. The reflux portion of the exam is performed with the patient in reverse Trendelenburg.  +---------+---------------+---------+-----------+----------+--------------+  RIGHT     Compressibility Phasicity Spontaneity Properties Thrombus Aging  +---------+---------------+---------+-----------+----------+--------------+  CFV       Full            Yes       Yes                                    +---------+---------------+---------+-----------+----------+--------------+  SFJ       Full                                                             +---------+---------------+---------+-----------+----------+--------------+  FV Prox   Full                                                              +---------+---------------+---------+-----------+----------+--------------+  FV Mid    Full                                                             +---------+---------------+---------+-----------+----------+--------------+  FV Distal Full                                                             +---------+---------------+---------+-----------+----------+--------------+  PFV       Full                                                             +---------+---------------+---------+-----------+----------+--------------+  POP       Full            Yes       Yes                                    +---------+---------------+---------+-----------+----------+--------------+  PTV       Full                                                             +---------+---------------+---------+-----------+----------+--------------+  PERO      Full                                                             +---------+---------------+---------+-----------+----------+--------------+   +---------+---------------+---------+-----------+----------+--------------+  LEFT      Compressibility Phasicity Spontaneity Properties Thrombus Aging  +---------+---------------+---------+-----------+----------+--------------+  CFV       Full            Yes       Yes                                    +---------+---------------+---------+-----------+----------+--------------+  SFJ       Full                                                             +---------+---------------+---------+-----------+----------+--------------+  FV Prox   Full                                                             +---------+---------------+---------+-----------+----------+--------------+  FV Mid    Full                                                             +---------+---------------+---------+-----------+----------+--------------+  FV Distal Full                                                              +---------+---------------+---------+-----------+----------+--------------+  PFV       Full                                                             +---------+---------------+---------+-----------+----------+--------------+  POP       Full            Yes       Yes                                    +---------+---------------+---------+-----------+----------+--------------+  PTV       Full                                                             +---------+---------------+---------+-----------+----------+--------------+  PERO      Full                                                             +---------+---------------+---------+-----------+----------+--------------+     Summary: BILATERAL: - No evidence of deep vein thrombosis seen in the lower extremities, bilaterally. - RIGHT: - Findings appear essentially unchanged compared to previous examination.  LEFT: - Findings appear essentially unchanged compared to previous examination.  *See table(s) above for measurements and observations. Electronically signed by Harold Barban MD on 09/13/2020 at 7:43:20 PM.    Final    VAS [place] LOWER EXTREMITY VENOUS (DVT)  Result Date: 09/03/2020  Lower Venous DVTStudy Indications: Edema.  Limitations: Patient positioning. Comparison Study: no prior Performing Technologist: Abram Sander RVS  Examination Guidelines: A complete evaluation includes B-mode imaging, spectral Doppler, color Doppler, and power Doppler as needed of all accessible portions of each vessel. Bilateral testing is considered an integral  part of a complete examination. Limited examinations for reoccurring indications may be performed as noted. The reflux portion of the exam is performed with the patient in reverse Trendelenburg.  +---------+---------------+---------+-----------+----------+--------------+  RIGHT     Compressibility Phasicity Spontaneity Properties Thrombus Aging  +---------+---------------+---------+-----------+----------+--------------+   CFV       Full            Yes       Yes                                    +---------+---------------+---------+-----------+----------+--------------+  SFJ       Full                                                             +---------+---------------+---------+-----------+----------+--------------+  FV Prox   Full                                                             +---------+---------------+---------+-----------+----------+--------------+  FV Mid    Full                                                             +---------+---------------+---------+-----------+----------+--------------+  FV Distal Full                                                             +---------+---------------+---------+-----------+----------+--------------+  PFV       Full                                                             +---------+---------------+---------+-----------+----------+--------------+  POP       Full            Yes       Yes                                    +---------+---------------+---------+-----------+----------+--------------+  PTV       Full                                                             +---------+---------------+---------+-----------+----------+--------------+  PERO      Full                                                             +---------+---------------+---------+-----------+----------+--------------+   +---------+---------------+---------+-----------+----------+--------------+  LEFT      Compressibility Phasicity Spontaneity Properties Thrombus Aging  +---------+---------------+---------+-----------+----------+--------------+  CFV       Full            Yes       Yes                                    +---------+---------------+---------+-----------+----------+--------------+  SFJ       Full                                                             +---------+---------------+---------+-----------+----------+--------------+  FV Prox   Full                                                              +---------+---------------+---------+-----------+----------+--------------+  FV Mid    Full                                                             +---------+---------------+---------+-----------+----------+--------------+  FV Distal Full                                                             +---------+---------------+---------+-----------+----------+--------------+  PFV       Full                                                             +---------+---------------+---------+-----------+----------+--------------+  POP       Full            Yes       Yes                                    +---------+---------------+---------+-----------+----------+--------------+  PTV       Full                                                             +---------+---------------+---------+-----------+----------+--------------+  PERO  Not visualized  +---------+---------------+---------+-----------+----------+--------------+     Summary: BILATERAL: - No evidence of deep vein thrombosis seen in the lower extremities, bilaterally. - No evidence of superficial venous thrombosis in the lower extremities, bilaterally. -   *See table(s) above for measurements and observations. Electronically signed by Servando Snare MD on 09/03/2020 at 7:12:00 PM.    Final    ECHOCARDIOGRAM LIMITED  Result Date: 09/02/2020    ECHOCARDIOGRAM LIMITED REPORT   Patient Name:   JAELLE CAMPANILE Date of Exam: 09/01/2020 Medical Rec #:  [id number]           Height:       64.0 in Accession #:    [id number]          Weight:       255.3 lb Date of Birth:  1956-02-03           BSA:          2.170 m Patient Age:    18 years            BP:           122/79 mmHg Patient Gender: F                   HR:           97 bpm. Exam Location:  Inpatient Procedure: 2D Echo                                 MODIFIED REPORT: This report was modified by Sanda Klein MD on 09/02/2020 due  to Corrected IVC                                    statement.  Indications:     786.09 dyspnea  History:         Patient has no prior history of Echocardiogram examinations.                  Risk Factors:Former Smoker. Covid +.  Sonographer:     Jannett Celestine RDCS (AE) Referring Phys:  [id number] Lanier Clam Diagnosing Phys: Sanda Klein MD IMPRESSIONS  1. Left ventricular ejection fraction, by estimation, is 60 to 65%. The left ventricle has normal function. The left ventricle has no regional wall motion abnormalities. Left ventricular diastolic parameters were normal.  2. Right ventricular systolic function is normal. The right ventricular size is normal. There is severely elevated pulmonary artery systolic pressure.  3. The mitral valve is normal in structure. No evidence of mitral valve regurgitation. No evidence of mitral stenosis.  4. The aortic valve is normal in structure. Aortic valve regurgitation is not visualized. No aortic stenosis is present.  5. The inferior vena cava is dilated in size with <50% respiratory variability, suggesting right atrial pressure of 15 mmHg. FINDINGS  Left Ventricle: Left ventricular ejection fraction, by estimation, is 60 to 65%. The left ventricle has normal function. The left ventricle has no regional wall motion abnormalities. The left ventricular internal cavity size was normal in size. There is  no left ventricular hypertrophy. Left ventricular diastolic parameters were normal. Right Ventricle: The right ventricular size is normal. No increase in right ventricular wall thickness. Right ventricular systolic function is normal. There is severely elevated pulmonary artery systolic pressure. The tricuspid regurgitant velocity is 3.43 m/s, and with an assumed right  atrial pressure of 15 mmHg, the estimated right ventricular systolic pressure is 09.6 mmHg. Left Atrium: Left atrial size was normal in size. Right Atrium: Right atrial size was normal in size. Pericardium:  There is no evidence of pericardial effusion. Mitral Valve: The mitral valve is normal in structure. No evidence of mitral valve stenosis. Tricuspid Valve: The tricuspid valve is normal in structure. Tricuspid valve regurgitation is trivial. No evidence of tricuspid stenosis. Aortic Valve: The aortic valve is normal in structure. Aortic valve regurgitation is not visualized. No aortic stenosis is present. Pulmonic Valve: The pulmonic valve was normal in structure. Pulmonic valve regurgitation is not visualized. No evidence of pulmonic stenosis. Aorta: The aortic root is normal in size and structure. Venous: The inferior vena cava is dilated in size with less than 50% respiratory variability, suggesting right atrial pressure of 15 mmHg. IAS/Shunts: No atrial level shunt detected by color flow Doppler. LEFT VENTRICLE PLAX 2D LVIDd:         4.25 cm Diastology LVIDs:         3.06 cm LV e' medial:    8.38 cm/s LV PW:         0.96 cm LV E/e' medial:  8.9 LV IVS:        0.93 cm LV e' lateral:   12.40 cm/s                        LV E/e' lateral: 6.0  MITRAL VALVE               TRICUSPID VALVE MV Area (PHT): 4.26 cm    TR Peak grad:   47.1 mmHg MV Decel Time: 178 msec    TR Vmax:        343.00 cm/s MV E velocity: 74.55 cm/s MV A velocity: 68.98 cm/s MV E/A ratio:  1.08 Mihai Croitoru MD Electronically signed by Sanda Klein MD Signature Date/Time: 09/01/2020/3:40:50 PM    Final (Updated)

## 2020-09-15 NOTE — Progress Notes (Signed)
   09/15/20 1135  Assess: MEWS Score  Temp 99 F (37.2 C)  BP 114/77  Pulse Rate (!) 106  ECG Heart Rate (!) 107  Resp (!) 24  Level of Consciousness Alert  SpO2 94 %  O2 Device HFNC;Non-rebreather Mask  O2 Flow Rate (L/min) 20 L/min  FiO2 (%) 100 %  Assess: MEWS Score  MEWS Temp 0  MEWS Systolic 0  MEWS Pulse 1  MEWS RR 1  MEWS LOC 0  MEWS Score 2  MEWS Score Color Yellow  Assess: if the MEWS score is Yellow or Red  Were vital signs taken at a resting state? Yes  Focused Assessment Change from prior assessment (see assessment flowsheet)  Early Detection of Sepsis Score *See Row Information* Low  MEWS guidelines implemented *See Row Information* Yes  Treat  MEWS Interventions Escalated (See documentation below)  Pain Scale 0-10  Pain Score 0  Take Vital Signs  Increase Vital Sign Frequency  Yellow: Q 2hr X 2 then Q 4hr X 2, if remains yellow, continue Q 4hrs  Escalate  MEWS: Escalate Yellow: discuss with charge nurse/RN and consider discussing with provider and RRT  Notify: Charge Nurse/RN  Name of Charge Nurse/RN Notified Erin RN  Date Charge Nurse/RN Notified 09/15/20  Time Charge Nurse/RN Notified [id number]  Notify: Provider  Provider Name/Title Dr. Candiss Norse  Date Provider Notified 09/15/20  Time Provider Notified 1149  Notification Type Page  Notification Reason Change in status  Response No new orders  Date of Provider Response 09/15/20  Time of Provider Response 1150  Document  Progress note created (see row info) Yes

## 2020-09-15 NOTE — Progress Notes (Signed)
ANTICOAGULATION CONSULT NOTE - Follow Up Consult  Pharmacy Consult for Lovenox Indication: COVID-19 infection with elevated d-dimer  Allergies  Allergen Reactions  . Penicillins Rash    Patient Measurements: Height: 5\' 4"  (162.6 cm) Weight: 108.5 kg (239 lb 3.2 oz) IBW/kg (Calculated) : 54.7 Lovenox Dosing Weight: 108.5 kg  Vital Signs: Temp: 99 F (37.2 C) (10/05 1135) Temp Source: Axillary (10/05 1135) BP: 114/77 (10/05 1135) Pulse Rate: 106 (10/05 1135)  Labs: Recent Labs    09/13/20 0515 09/13/20 0515 09/14/20 0145 09/15/20 0408  HGB 10.2*   < > 10.3* 11.0*  HCT 34.3*  --  34.0* 37.5  PLT 359  --  335 269  CREATININE 0.77  --  0.79 0.73   < > = values in this interval not displayed.    Estimated Creatinine Clearance: 87.7 mL/min (by C-G formula based on SCr of 0.73 mg/dL).  Assessment:  73 yof presenting COVID-19 positive. Was on ~0.5 mg/kg Lovenox for prophylaxis, but increased to treatment dose on 9/29 due to rising d-dimer, up to 14.76. Initial plan was to decrease dose when d-dimer < 2, but now plan to decrease when d-dimer <3.  D-dimer down to 3.51 today.  CTA negative for PE and duplex negative for DVT. Patient was not on anticoagulation PTA.  CBC and renal function stable.  Goal of Therapy:  Anti-Xa level 0.6-1 units/ml 4hrs after LMWH dose given Monitor platelets by anticoagulation protocol: Yes   Plan:  Continue Lovenox 110 mg (~1 mg/kg) SQ q12h Follow up d-dimer to decrease to prophylactic dose when d-dimer < 3. Follow CBC, renal function. Monitor for s/sx bleeding.   Arty Baumgartner, [place] Phone: [phone] 09/15/2020,12:16 PM

## 2020-09-16 DIAGNOSIS — R0602 Shortness of breath: Secondary | ICD-10-CM

## 2020-09-16 LAB — BRAIN NATRIURETIC PEPTIDE: B Natriuretic Peptide: 26.5 pg/mL (ref 0.0–100.0)

## 2020-09-16 LAB — COMPREHENSIVE METABOLIC PANEL
ALT: 226 U/L — ABNORMAL HIGH (ref 0–44)
AST: 70 U/L — ABNORMAL HIGH (ref 15–41)
Albumin: 2.5 g/dL — ABNORMAL LOW (ref 3.5–5.0)
Alkaline Phosphatase: 69 U/L (ref 38–126)
Anion gap: 12 (ref 5–15)
BUN: 13 mg/dL (ref 8–23)
CO2: 29 mmol/L (ref 22–32)
Calcium: 8.8 mg/dL — ABNORMAL LOW (ref 8.9–10.3)
Chloride: 97 mmol/L — ABNORMAL LOW (ref 98–111)
Creatinine, Ser: 0.65 mg/dL (ref 0.44–1.00)
GFR calc non Af Amer: >60 mL/min (ref >60)
Glucose, Bld: 78 mg/dL (ref 70–99)
Potassium: 4.3 mmol/L (ref 3.5–5.1)
Sodium: 138 mmol/L (ref 135–145)
Total Bilirubin: 0.6 mg/dL (ref 0.3–1.2)
Total Protein: 6.5 g/dL (ref 6.5–8.1)

## 2020-09-16 LAB — PROCALCITONIN: Procalcitonin: 0.1 ng/mL

## 2020-09-16 LAB — CBC WITH DIFFERENTIAL/PLATELET
Abs Immature Granulocytes: 0.15 10*3/uL — ABNORMAL HIGH (ref 0.00–0.07)
Basophils Absolute: 0 10*3/uL (ref 0.0–0.1)
Basophils Relative: 0 %
Eosinophils Absolute: 0.3 10*3/uL (ref 0.0–0.5)
Eosinophils Relative: 1 %
HCT: 32.7 % — ABNORMAL LOW (ref 36.0–46.0)
Hemoglobin: 9.8 g/dL — ABNORMAL LOW (ref 12.0–15.0)
Immature Granulocytes: 1 %
Lymphocytes Relative: 7 %
Lymphs Abs: 1.4 10*3/uL (ref 0.7–4.0)
MCH: 26.8 pg (ref 26.0–34.0)
MCHC: 30 g/dL (ref 30.0–36.0)
MCV: 89.6 fL (ref 80.0–100.0)
Monocytes Absolute: 0.9 10*3/uL (ref 0.1–1.0)
Monocytes Relative: 5 %
Neutro Abs: 17 10*3/uL — ABNORMAL HIGH (ref 1.7–7.7)
Neutrophils Relative %: 86 %
Platelets: 267 10*3/uL (ref 150–400)
RBC: 3.65 MIL/uL — ABNORMAL LOW (ref 3.87–5.11)
RDW: 13.5 % (ref 11.5–15.5)
WBC: 19.7 10*3/uL — ABNORMAL HIGH (ref 4.0–10.5)
nRBC: 0 % (ref 0.0–0.2)

## 2020-09-16 LAB — D-DIMER, QUANTITATIVE: D-Dimer, Quant: 3.38 ug/mL-FEU — ABNORMAL HIGH (ref 0.00–0.50)

## 2020-09-16 LAB — GLUCOSE, CAPILLARY
Glucose-Capillary: 102 mg/dL — ABNORMAL HIGH (ref 70–99)
Glucose-Capillary: 107 mg/dL — ABNORMAL HIGH (ref 70–99)
Glucose-Capillary: 125 mg/dL — ABNORMAL HIGH (ref 70–99)
Glucose-Capillary: 135 mg/dL — ABNORMAL HIGH (ref 70–99)
Glucose-Capillary: 145 mg/dL — ABNORMAL HIGH (ref 70–99)
Glucose-Capillary: 183 mg/dL — ABNORMAL HIGH (ref 70–99)
Glucose-Capillary: 241 mg/dL — ABNORMAL HIGH (ref 70–99)

## 2020-09-16 LAB — C-REACTIVE PROTEIN: CRP: 6.3 mg/dL — ABNORMAL HIGH (ref <1.0)

## 2020-09-16 LAB — MAGNESIUM: Magnesium: 2.1 mg/dL (ref 1.7–2.4)

## 2020-09-16 MED ORDER — PREDNISONE 20 MG PO TABS
60.0000 mg | ORAL_TABLET | Freq: Every day | ORAL | Status: DC
  Administered 2020-09-17 – 2020-09-18 (×2): 60 mg via ORAL
  Filled 2020-09-16 (×2): qty 3

## 2020-09-16 NOTE — Progress Notes (Signed)
Occupational Therapy Treatment Patient Details Name: Wendy Ruiz MRN: [id number] DOB: 1956-01-21 Today's Date: 09/16/2020    History of present illness Pt is a 64 y.o. female admitted 08/29/20 not feeling well, apparently had syncopal episode and woke up to EMS present for hospital transport. CXR with bilateral infiltrates; pt hypoxic requiring NRB. PMH includes Bipolar 1, chronic pain, obesity.   OT comments  Pt seen for OT follow up session with focus on energy conservation training and mobility progression. Pt on 20 L HHFNC + 15L NRB. Pt completed sit <> stands x3 with SpO2 as low as 76%. With seated rest and cues for slow, controlled pursed lip breathing pt able to recover to high 80s after ~2-3 mins. Pt then completed standing marches with O2 as low as 74%, again requiring seated rest and recovery for 2-3 mins. After activity, pt sats remaining at ~79-80% despite increased time and pursed lip breathing. Pt ultimately required 25L HHFNC + 15L NRB to maintain sats >85%. After 5 mins of pursed lip breathing pt able to be titrated back down to 20 L HHFNC + 15L NRB. Continued to reinforce energy conservation skills and assisted pt with reading monitor and interpreting values for herself. D/c recs remain appropriate- pending progress with O2 demands. Will continue to follow.   Pt on 20 L HHFNC + 15L NRB. Sats as low as 74% with standing activity. RR up to 47, especially with coughing. When pt was actively controlling breathing, RR down to as low as 25. Pt ultimately needed 25L HHFNC +15L NRB to recover for 5 mins before being titrated back to 20 L HHFNC + 15L NRB.   Follow Up Recommendations  Home health OT;Supervision/Assistance - 24 hour (pending progress)    Equipment Recommendations  3 in 1 bedside commode    Recommendations for Other Services      Precautions / Restrictions Precautions Precautions: Fall;Other (comment) Precaution Comments: HHFNC+NRB Restrictions Weight Bearing  Restrictions: No       Mobility Bed Mobility               General bed mobility comments: up in chair, returned to chair  Transfers Overall transfer level: Needs assistance Equipment used: None Transfers: Sit to/from Stand Sit to Stand: Min guard         General transfer comment: for management of lines    Balance Overall balance assessment: Needs assistance   Sitting balance-Leahy Scale: Good     Standing balance support: No upper extremity supported Standing balance-Leahy Scale: Fair                             ADL either performed or assessed with clinical judgement   ADL Overall ADL's : Needs assistance/impaired Eating/Feeding: Set up;Sitting Eating/Feeding Details (indicate cue type and reason): able to verbalize appropriate way to compensate eating with managing NRB mask                                   General ADL Comments: Session focused on sit <> stands, marching in place, and energy conservation training     Vision Baseline Vision/History: Wears glasses Wears Glasses: At all times Patient Visual Report: No change from baseline     Perception     Praxis      Cognition Arousal/Alertness: Awake/alert Behavior During Therapy: WFL for tasks assessed/performed Overall Cognitive Status: Within Functional Limits  for tasks assessed                                          Exercises Other Exercises Other Exercises: sit <> stands: able to tolerate 3 with increased time in between each movement. Other Exercises: Marching in place: tolerated ~2 mins before needing seated rest break   Shoulder Instructions       General Comments      Pertinent Vitals/ Pain       Pain Assessment: No/denies pain  Home Living                                          Prior Functioning/Environment              Frequency  Min 3X/week        Progress Toward Goals  OT Goals(current goals  can now be found in the care plan section)  Progress towards OT goals: Progressing toward goals  Acute Rehab OT Goals Patient Stated Goal: continue to get stronger OT Goal Formulation: With patient Time For Goal Achievement: 09/24/20 Potential to Achieve Goals: Good  Plan Discharge plan remains appropriate    Co-evaluation                 AM-PAC OT "6 Clicks" Daily Activity     Outcome Measure   Help from another person eating meals?: A Little Help from another person taking care of personal grooming?: A Little Help from another person toileting, which includes using toliet, bedpan, or urinal?: A Little Help from another person bathing (including washing, rinsing, drying)?: A Little Help from another person to put on and taking off regular upper body clothing?: A Little Help from another person to put on and taking off regular lower body clothing?: A Little 6 Click Score: 18    End of Session Equipment Utilized During Treatment: Oxygen  OT Visit Diagnosis: Unsteadiness on feet (R26.81);Other abnormalities of gait and mobility (R26.89);Muscle weakness (generalized) (M62.81);Pain   Activity Tolerance Patient tolerated treatment well   Patient Left in chair;with call bell/phone within reach;with nursing/sitter in room   Nurse Communication Mobility status        Time: 1610-9604 OT Time Calculation (min): 24 min  Charges: OT General Charges $OT Visit: 1 Visit OT Treatments $Therapeutic Activity: 23-37 mins  Zenovia Jarred, MSOT, OTR/L [facility] Office Number: [phone] Pager: [phone]  Zenovia Jarred 09/16/2020, 5:38 PM

## 2020-09-16 NOTE — Progress Notes (Signed)
Wendy Ruiz  TDD:[id number] DOB: 1956-07-30 DOA: 08/29/2020 PCP: Lyndee Hensen, DO    Brief Narrative:  64 year old with a history of bipolar disorder, obesity, and GERD who was admitted to the hospital 9/18 with severe acute hypoxic respiratory failure and diagnosed with Covid pneumonia. The patient was not vaccinated against Covid. The patient was under the care of PCCM in the ICU and BiPAP dependent for a significant portion of the hospital stay. She was transferred to the hospital service 9/29. At that time she was requiring 50 L of heated high flow nasal cannula support with additional nonrebreather mask support.  Significant Events:  9/18 admit via [place]  9/19 CTa chest no PE -near confluent dense groundglass opacities all lobes bilaterally 9/21 TTE unrevealing 9/23 bilateral lower extremity venous duplex negative 9/29 [facility] assumed care from [facility]  10/2 bilateral lower extremity venous duplex negative 10/3 CTa chest without PE  Date of Positive COVID Test:  9/19  Date Isolation Ends: 10/9  Vaccination Status: Not vaccinated against Covid  COVID-19 specific Treatment: Baricitinib 9/19 > 10/2 Steroid 9/18 > Remdesivir 9/19 > 9/23  Antimicrobials:  Cefepime 9/23 > 9/27 Levaquin 9/29 > 10/2  DVT prophylaxis: Full dose Lovenox  Subjective: Continues to require heated high flow nasal cannula, presently at 20 L and 60%.  States she feels better overall today and thinks she is making progress.  Denies chest pain nausea vomiting abdominal pain.  Reports slowly improving appetite.  Assessment & Plan:  COVID Pneumonia -acute severe hypoxic respiratory failure Has completed a course of remdesivir and baricitinib - dosing with steroids -continue to wean oxygen support as able  Recent Labs  Lab 09/11/20 1116 09/11/20 1116 09/12/20 0351 09/13/20 0515 09/14/20 0145 09/15/20 0408 09/16/20 0630  DDIMER 7.18*   < > 4.55* 5.15* 3.58* 3.51* 3.38*  CRP  3.9*   < > 3.8* 6.3* 8.2* 7.7* 6.3*  ALT 143*   < > 157* 233* 277* 284* 226*  PROCALCITON <0.10  --  <0.10 <0.10 <0.10 <0.10  --    < > = values in this interval not displayed.    Elevated D-dimer Given clinical scenario patient is being dosed with full dose Lovenox - CTA x2 and venous duplex lower extremities x2 negative for thrombus - plan is to continue full anticoagulation until D-dimer below three  Indeterminate QuantiFERON Obtained while in ICU -no evidence on CTA chest of active tuberculosis  GERD Continue PPI  Mild tachycardia TSH normal - CTa chest without acute findings  Code Status: FULL CODE Family Communication: No family present at time of exam Status is: Inpatient  Remains inpatient appropriate because:Inpatient level of care appropriate due to severity of illness   Dispo: The patient is from: Home              Anticipated d/c is to: unclear              Anticipated d/c date is: 3 days              Patient currently is not medically stable to d/c.   Consultants:  PCCM  Objective: Blood pressure 98/67, pulse 99, temperature 98.8 F (37.1 C), temperature source Axillary, resp. rate 20, height 5\' 4"  (1.626 m), weight 108.5 kg, SpO2 95 %.  Intake/Output Summary (Last 24 hours) at 09/16/2020 1022 Last data filed at 09/15/2020 1334 Gross per 24 hour  Intake 240 ml  Output --  Net 240 ml   [employer]   09/03/20  1103 09/04/20 1500 09/07/20 0500  Weight: 112.2 kg 106.9 kg 108.5 kg    Examination: General: Not in extremis requiring heated high flow nasal cannula support Lungs: Fine diffuse crackles with no wheezing bilaterally Cardiovascular: Regular rate and rhythm without murmur gallop or rub normal S1 and S2 Abdomen: Nontender, nondistended, soft, bowel sounds positive, no rebound, no ascites, no appreciable mass Extremities: No significant cyanosis, clubbing, or edema bilateral lower extremities  CBC: Recent Labs  Lab 09/14/20 0145 09/15/20 0408  09/16/20 0630  WBC 18.6* 20.6* 19.7*  NEUTROABS 16.3* 17.8* 17.0*  HGB 10.3* 11.0* 9.8*  HCT 34.0* 37.5 32.7*  MCV 88.3 89.1 89.6  PLT 335 269 998   Basic Metabolic Panel: Recent Labs  Lab 09/14/20 0145 09/15/20 0408 09/16/20 0630  NA 137 138 138  K 4.1 4.0 4.3  CL 100 99 97*  CO2 26 27 29   GLUCOSE 136* 95 78  BUN 14 14 13   CREATININE 0.79 0.73 0.65  CALCIUM 8.9 9.2 8.8*  MG 2.3 2.2 2.1   GFR: Estimated Creatinine Clearance: 87.7 mL/min (by C-G formula based on SCr of 0.65 mg/dL).  Liver Function Tests: Recent Labs  Lab 09/13/20 0515 09/14/20 0145 09/15/20 0408 09/16/20 0630  AST 110* 115* 94* 70*  ALT 233* 277* 284* 226*  ALKPHOS 70 69 75 69  BILITOT 0.8 0.7 0.9 0.6  PROT 6.1* 6.1* 7.0 6.5  ALBUMIN 2.4* 2.4* 2.8* 2.5*    HbA1C: Hgb A1c MFr Bld  Date/Time Value Ref Range Status  08/31/2020 05:18 AM 5.7 (H) 4.8 - 5.6 % Final    Comment:    (NOTE) Pre diabetes:          5.7%-6.4%  Diabetes:              >6.4%  Glycemic control for   <7.0% adults with diabetes     CBG: Recent Labs  Lab 09/15/20 1629 09/15/20 1953 09/16/20 0032 09/16/20 0407 09/16/20 0731  GLUCAP 175* 267* 145* 102* 107*    Recent Results (from the past 240 hour(s))  Expectorated sputum assessment w rflx to resp cult     Status: None   Collection Time: 09/09/20  1:52 PM   Specimen: Expectorated Sputum  Result Value Ref Range Status   Specimen Description EXPECTORATED SPUTUM  Final   Special Requests NONE  Final   Sputum evaluation   Final    Sputum specimen not acceptable for testing.  Please recollect.   Gram Stain Report Called to,Read Back By and Verified With: J. SCOTT RN, AT [phone] 09/11/20 BY Rush Landmark Performed at [facility], [place] [address], [place], [place] [address]    Report Status 09/11/2020 FINAL  Final     Scheduled Meds: . (feeding supplement) PROSource Plus  30 mL Oral BID BM  . Chlorhexidine Gluconate Cloth  6 each Topical Daily  . enoxaparin  (LOVENOX) injection  110 mg Subcutaneous Q12H  . feeding supplement (ENSURE ENLIVE)  237 mL Oral TID  . insulin aspart  0-15 Units Subcutaneous Q4H  . insulin detemir  10 Units Subcutaneous BID  . mouth rinse  15 mL Mouth Rinse BID  . methylPREDNISolone (SOLU-MEDROL) injection  40 mg Intravenous Daily  . metoprolol tartrate  12.5 mg Oral BID  . pantoprazole  40 mg Oral Q1200  . polyethylene glycol  17 g Oral BID  . sodium chloride flush  10-40 mL Intracatheter Q12H   Continuous Infusions: . sodium chloride 10 mL/hr at 09/07/20 2000  LOS: 44 days   Cherene Altes, MD [facility] Office  [phone] Pager - Text Page per Amion  If 7PM-7AM, please contact night-coverage per Amion 09/16/2020, 10:22 AM

## 2020-09-17 LAB — C-REACTIVE PROTEIN: CRP: 10.3 mg/dL — ABNORMAL HIGH (ref <1.0)

## 2020-09-17 LAB — COMPREHENSIVE METABOLIC PANEL
ALT: 211 U/L — ABNORMAL HIGH (ref 0–44)
AST: 74 U/L — ABNORMAL HIGH (ref 15–41)
Albumin: 2.4 g/dL — ABNORMAL LOW (ref 3.5–5.0)
Alkaline Phosphatase: 66 U/L (ref 38–126)
Anion gap: 9 (ref 5–15)
BUN: 12 mg/dL (ref 8–23)
CO2: 31 mmol/L (ref 22–32)
Calcium: 9 mg/dL (ref 8.9–10.3)
Chloride: 98 mmol/L (ref 98–111)
Creatinine, Ser: 0.77 mg/dL (ref 0.44–1.00)
GFR calc non Af Amer: >60 mL/min (ref >60)
Glucose, Bld: 92 mg/dL (ref 70–99)
Potassium: 3.9 mmol/L (ref 3.5–5.1)
Sodium: 138 mmol/L (ref 135–145)
Total Bilirubin: 0.2 mg/dL — ABNORMAL LOW (ref 0.3–1.2)
Total Protein: 6.4 g/dL — ABNORMAL LOW (ref 6.5–8.1)

## 2020-09-17 LAB — GLUCOSE, CAPILLARY
Glucose-Capillary: 150 mg/dL — ABNORMAL HIGH (ref 70–99)
Glucose-Capillary: 151 mg/dL — ABNORMAL HIGH (ref 70–99)
Glucose-Capillary: 219 mg/dL — ABNORMAL HIGH (ref 70–99)
Glucose-Capillary: 258 mg/dL — ABNORMAL HIGH (ref 70–99)
Glucose-Capillary: 75 mg/dL (ref 70–99)
Glucose-Capillary: 88 mg/dL (ref 70–99)

## 2020-09-17 LAB — CBC WITH DIFFERENTIAL/PLATELET
Abs Immature Granulocytes: 0.18 10*3/uL — ABNORMAL HIGH (ref 0.00–0.07)
Basophils Absolute: 0 10*3/uL (ref 0.0–0.1)
Basophils Relative: 0 %
Eosinophils Absolute: 0.1 10*3/uL (ref 0.0–0.5)
Eosinophils Relative: 0 %
HCT: 32.4 % — ABNORMAL LOW (ref 36.0–46.0)
Hemoglobin: 9.6 g/dL — ABNORMAL LOW (ref 12.0–15.0)
Immature Granulocytes: 1 %
Lymphocytes Relative: 8 %
Lymphs Abs: 1.3 10*3/uL (ref 0.7–4.0)
MCH: 26.4 pg (ref 26.0–34.0)
MCHC: 29.6 g/dL — ABNORMAL LOW (ref 30.0–36.0)
MCV: 89 fL (ref 80.0–100.0)
Monocytes Absolute: 0.7 10*3/uL (ref 0.1–1.0)
Monocytes Relative: 4 %
Neutro Abs: 13.6 10*3/uL — ABNORMAL HIGH (ref 1.7–7.7)
Neutrophils Relative %: 87 %
Platelets: 258 10*3/uL (ref 150–400)
RBC: 3.64 MIL/uL — ABNORMAL LOW (ref 3.87–5.11)
RDW: 13.6 % (ref 11.5–15.5)
WBC: 15.8 10*3/uL — ABNORMAL HIGH (ref 4.0–10.5)
nRBC: 0 % (ref 0.0–0.2)

## 2020-09-17 LAB — D-DIMER, QUANTITATIVE: D-Dimer, Quant: 2.2 ug/mL-FEU — ABNORMAL HIGH (ref 0.00–0.50)

## 2020-09-17 LAB — MAGNESIUM: Magnesium: 2.4 mg/dL (ref 1.7–2.4)

## 2020-09-17 LAB — PROCALCITONIN: Procalcitonin: <0.1 ng/mL

## 2020-09-17 MED ORDER — INSULIN DETEMIR 100 UNIT/ML ~~LOC~~ SOLN
10.0000 [IU] | Freq: Every day | SUBCUTANEOUS | Status: DC
  Administered 2020-09-18: 10 [IU] via SUBCUTANEOUS
  Filled 2020-09-17 (×2): qty 0.1

## 2020-09-17 MED ORDER — INSULIN ASPART 100 UNIT/ML ~~LOC~~ SOLN
0.0000 [IU] | Freq: Three times a day (TID) | SUBCUTANEOUS | Status: DC
  Administered 2020-09-17: 5 [IU] via SUBCUTANEOUS
  Administered 2020-09-18 (×2): 3 [IU] via SUBCUTANEOUS

## 2020-09-17 MED ORDER — ENOXAPARIN SODIUM 60 MG/0.6ML ~~LOC~~ SOLN
55.0000 mg | SUBCUTANEOUS | Status: DC
  Administered 2020-09-17 – 2020-09-27 (×11): 55 mg via SUBCUTANEOUS
  Filled 2020-09-17 (×11): qty 0.6

## 2020-09-17 NOTE — Progress Notes (Signed)
Wendy Ruiz  HUD:[id number] DOB: [birth year] DOA: 08/29/2020 PCP: Lyndee Hensen, DO    Brief Narrative:  64 year old with a history of bipolar disorder, obesity, and GERD who was admitted to the hospital 9/18 with severe acute hypoxic respiratory failure and diagnosed with Covid pneumonia. The patient was not vaccinated against Covid. The patient was under the care of PCCM in the ICU and BiPAP dependent for a significant portion of the hospital stay. She was transferred to the hospital service 9/29. At that time she was requiring 50 L of heated high flow nasal cannula support with additional nonrebreather mask support.  Significant Events:  9/18 admit via [place]  9/19 CTa chest no PE -near confluent dense groundglass opacities all lobes bilaterally 9/21 TTE unrevealing 9/23 bilateral lower extremity venous duplex negative 9/29 [facility] assumed care from [facility]  10/2 bilateral lower extremity venous duplex negative 10/3 CTa chest without PE  Date of Positive COVID Test:  9/19  Date Isolation Ends: 10/9  Vaccination Status: Not vaccinated against Covid  COVID-19 specific Treatment: Baricitinib 9/19 > 10/2 Steroid 9/18 > Remdesivir 9/19 > 9/23  Antimicrobials:  Cefepime 9/23 > 9/27 Levaquin 9/29 > 10/2  DVT prophylaxis: Full dose Lovenox  Subjective: Continues to require heated high flow nasal cannula.  Vital signs otherwise stable.  States she continues to feel a little better each day.  No chest pain nausea vomiting or abdominal pain.  Assessment & Plan:  COVID Pneumonia -acute severe hypoxic respiratory failure Has completed a course of remdesivir and baricitinib - dosing with steroids -continue to wean oxygen support as able  Recent Labs  Lab 09/13/20 0515 09/14/20 0145 09/15/20 0408 09/16/20 0630 09/17/20 0334  DDIMER 5.15* 3.58* 3.51* 3.38* 2.20*  CRP 6.3* 8.2* 7.7* 6.3* 10.3*  ALT 233* 277* 284* 226* 211*  PROCALCITON <0.10 <0.10 <0.10 0.10  <0.10    Elevated D-dimer Given clinical scenario patient was being dosed with full dose Lovenox - CTA x2 and venous duplex lower extremities x2 negative for thrombus -transitioned to prophylactic dose Lovenox today with falling D-dimer  Indeterminate QuantiFERON Obtained while in ICU -no evidence on CTA chest of active tuberculosis  GERD Continue PPI  Mild tachycardia TSH normal - CTa chest without acute findings  Code Status: FULL CODE Family Communication: No family present at time of exam Status is: Inpatient  Remains inpatient appropriate because:Inpatient level of care appropriate due to severity of illness   Dispo: The patient is from: Home              Anticipated d/c is to: unclear              Anticipated d/c date is: 3 days              Patient currently is not medically stable to d/c.   Consultants:  PCCM  Objective: Blood pressure 97/61, pulse 99, temperature 99.1 F (37.3 C), temperature source Axillary, resp. rate 20, height 5\' 4"  (1.626 m), weight 108.5 kg, SpO2 96 %.  Intake/Output Summary (Last 24 hours) at 09/17/2020 0946 Last data filed at 09/17/2020 0437 Gross per 24 hour  Intake 120 ml  Output 550 ml  Net -430 ml   Filed Weights   09/03/20 1103 09/04/20 1500 09/07/20 0500  Weight: 112.2 kg 106.9 kg 108.5 kg    Examination: General: Not in extremis  Lungs: Fine diffuse crackles without change Cardiovascular: RRR without murmur Abdomen: NT/ND, soft, BS positive, no rebound Extremities: No C/C/E bilateral lower  extremities  CBC: Recent Labs  Lab 09/15/20 0408 09/16/20 0630 09/17/20 0334  WBC 20.6* 19.7* 15.8*  NEUTROABS 17.8* 17.0* 13.6*  HGB 11.0* 9.8* 9.6*  HCT 37.5 32.7* 32.4*  MCV 89.1 89.6 89.0  PLT 269 267 675   Basic Metabolic Panel: Recent Labs  Lab 09/15/20 0408 09/16/20 0630 09/17/20 0334  NA 138 138 138  K 4.0 4.3 3.9  CL 99 97* 98  CO2 27 29 31   GLUCOSE 95 78 92  BUN 14 13 12   CREATININE 0.73 0.65 0.77   CALCIUM 9.2 8.8* 9.0  MG 2.2 2.1 2.4   GFR: Estimated Creatinine Clearance: 87.7 mL/min (by C-G formula based on SCr of 0.77 mg/dL).  Liver Function Tests: Recent Labs  Lab 09/14/20 0145 09/15/20 0408 09/16/20 0630 09/17/20 0334  AST 115* 94* 70* 74*  ALT 277* 284* 226* 211*  ALKPHOS 69 75 69 66  BILITOT 0.7 0.9 0.6 0.2*  PROT 6.1* 7.0 6.5 6.4*  ALBUMIN 2.4* 2.8* 2.5* 2.4*    HbA1C: Hgb A1c MFr Bld  Date/Time Value Ref Range Status  08/31/2020 05:18 AM 5.7 (H) 4.8 - 5.6 % Final    Comment:    (NOTE) Pre diabetes:          5.7%-6.4%  Diabetes:              >6.4%  Glycemic control for   <7.0% adults with diabetes     CBG: Recent Labs  Lab 09/16/20 1557 09/16/20 1953 09/16/20 2348 09/17/20 0346 09/17/20 0724  GLUCAP 241* 183* 125* 88 75    Recent Results (from the past 240 hour(s))  Expectorated sputum assessment w rflx to resp cult     Status: None   Collection Time: 09/09/20  1:52 PM   Specimen: Expectorated Sputum  Result Value Ref Range Status   Specimen Description EXPECTORATED SPUTUM  Final   Special Requests NONE  Final   Sputum evaluation   Final    Sputum specimen not acceptable for testing.  Please recollect.   Gram Stain Report Called to,Read Back By and Verified With: J. SCOTT RN, AT [phone] 09/11/20 BY Rush Landmark Performed at [facility], [place] [address], [place], [place] [address]    Report Status 09/11/2020 FINAL  Final     Scheduled Meds:  (feeding supplement) PROSource Plus  30 mL Oral BID BM   Chlorhexidine Gluconate Cloth  6 each Topical Daily   enoxaparin (LOVENOX) injection  55 mg Subcutaneous Q24H   feeding supplement (ENSURE ENLIVE)  237 mL Oral TID   insulin aspart  0-15 Units Subcutaneous Q4H   insulin detemir  10 Units Subcutaneous BID   mouth rinse  15 mL Mouth Rinse BID   metoprolol tartrate  12.5 mg Oral BID   pantoprazole  40 mg Oral Q1200   polyethylene glycol  17 g Oral BID   predniSONE  60 mg Oral Q  breakfast   sodium chloride flush  10-40 mL Intracatheter Q12H   Continuous Infusions:  sodium chloride 10 mL/hr at 09/07/20 2000     LOS: 18 days   Cherene Altes, MD [facility] Office  [phone] Pager - Text Page per Shea Evans  If 7PM-7AM, please contact night-coverage per Amion 09/17/2020, 9:46 AM

## 2020-09-17 NOTE — Progress Notes (Signed)
Patient's HHFNC not in her nose, is wearing a NRB mask 15 L , sats 100%.  [place] placed on standby. Sats remained at 100%.  Patient feels more comfortable wearing the NRB mask.

## 2020-09-17 NOTE — Progress Notes (Signed)
ANTICOAGULATION CONSULT NOTE - Follow Up Consult  Pharmacy Consult for Lovenox Indication: COVID-19 infection with elevated d-dimer  Allergies  Allergen Reactions  . Penicillins Rash    Patient Measurements: Height: 5\' 4"  (162.6 cm) Weight: 108.5 kg (239 lb 3.2 oz) IBW/kg (Calculated) : 54.7 Lovenox Dosing Weight: 108.5 kg  Vital Signs: Temp: 99.1 F (37.3 C) (10/07 0822) Temp Source: Axillary (10/07 0822) BP: 97/61 (10/07 0822) Pulse Rate: 99 (10/07 0822)  Labs: Recent Labs    09/15/20 0408 09/15/20 0408 09/16/20 0630 09/17/20 0334  HGB 11.0*   < > 9.8* 9.6*  HCT 37.5  --  32.7* 32.4*  PLT 269  --  267 258  CREATININE 0.73  --  0.65 0.77   < > = values in this interval not displayed.    Estimated Creatinine Clearance: 87.7 mL/min (by C-G formula based on SCr of 0.77 mg/dL).  Assessment: 52 yof presenting COVID-19 positive.  CTA negative for PE and duplex negative for DVT. Patient was not on anticoagulation PTA.  CBC and renal function stable.  D - dimer now less than 3  Goal of Therapy:  Anti-Xa level 0.6-1 units/ml 4hrs after LMWH dose given Monitor platelets by anticoagulation protocol: Yes   Plan:  Decrease Lovenox to 55 mg sq Q 24 hours starting tonight Monitor CBC  Thank you Anette Guarneri, PharmD [phone] 09/17/2020,8:58 AM

## 2020-09-17 NOTE — Progress Notes (Addendum)
Physical Therapy Treatment Patient Details Name: Wendy Ruiz MRN: [id number] DOB: 06-03-1956 Today's Date: 09/17/2020    History of Present Illness Pt is a 64 y.o. female admitted 08/29/20 not feeling well, apparently had syncopal episode and woke up to EMS present for hospital transport. CXR with bilateral infiltrates; pt hypoxic requiring NRB. PMH includes Bipolar 1, chronic pain, obesity.    PT Comments    Pt continues to be limited in safe mobility by oxygen desaturation with limited movement (see General Comments). Focus of therapy session to highlight what pt can do. Pt able to perform short bouts of LE exercise without O2 desaturation or 4/4 DoE. Pt able to stand for 20 seconds before having to sit due to SaO2 drop to 83%O2. Pt able to transfer to [facility], perform self pericare and return to chair with drop in SaO2 to mid 70s. Pt experienced increased anxiety while trying to recover and SaO2 dropped to mid 60s. With increased cuing and focus pt able to recover to mid 90%O2 within 5 minutes. PT highlighted pts ability to recover from anxiety with focus on her breathing. Pt is very motivate to work to recover from [employer]. Will continue to monitor for possible CIR after 10/9. PT will continue to follow acutely.     Follow Up Recommendations  Home health PT;Supervision for mobility/OOB (may need to consider CIR; 21st day =10/9)     Equipment Recommendations  Other (comment);Rolling walker with 5" wheels (further discernment TBD)    Recommendations for Other Services       Precautions / Restrictions Precautions Precautions: Fall;Other (comment) Precaution Comments: HHFNC+NRB Restrictions Weight Bearing Restrictions: No    Mobility  Bed Mobility              General bed mobility comments: up in chair, returned to chair  Transfers Overall transfer level: Needs assistance Equipment used: None Transfers: Sit to/from Stand Sit to Stand: Min guard Stand pivot transfers:  Min guard       General transfer comment: able to stand for 25 sec before SoB required sitting, able to pivot tranfers from chair to [employer] and back with BSC facing her         Balance Overall balance assessment: Needs assistance   Sitting balance-Leahy Scale: Good     Standing balance support: No upper extremity supported Standing balance-Leahy Scale: Fair                              Cognition Arousal/Alertness: Awake/alert Behavior During Therapy: WFL for tasks assessed/performed Overall Cognitive Status: Within Functional Limits for tasks assessed                                 General Comments: anxiety with SoB and O2 desaturation       Exercises General Exercises - Lower Extremity Long Arc Quad: AROM;Both;5 reps;Seated Hip ABduction/ADduction: AROM;Both;15 reps;Seated Hip Flexion/Marching: AROM;Both;5 reps;Seated Toe Raises: AROM;Both;10 reps;Seated Heel Raises: AROM;Both;10 reps;Seated Other Exercises Other Exercises: sit <> stand x1    General Comments General comments (skin integrity, edema, etc.): session conducted with pt on 20L O2 via [place] at 50%O2 with 15L NRB, at rest SaO2 96%O2 RR 22, with standing SaO2 dropped to 78%O2 and pr required 2 minutes to recover back to 90%O2 , with transfer <> to BSC SaO2 dropped to 76%O2, pt exhibited good purse lip breathing and able to recover  to 80%O2, however pr became anxious and RR increased to 44 bpm, SaO2 dropped to 63%O2, with constant cuing and focus pt was able to recover back to 90%O2       Pertinent Vitals/Pain Pain Assessment: No/denies pain           PT Goals (current goals can now be found in the care plan section) Acute Rehab PT Goals Patient Stated Goal: continue to get stronger PT Goal Formulation: With patient Time For Goal Achievement: 09/23/20 Potential to Achieve Goals: Good Progress towards PT goals: Progressing toward goals    Frequency    Min 3X/week      PT  Plan Current plan remains appropriate (however watching closely, may need CIR)       AM-PAC PT "6 Clicks" Mobility   Outcome Measure  Help needed turning from your back to your side while in a flat bed without using bedrails?: None Help needed moving from lying on your back to sitting on the side of a flat bed without using bedrails?: None Help needed moving to and from a bed to a chair (including a wheelchair)?: A Little Help needed standing up from a chair using your arms (e.g., wheelchair or bedside chair)?: A Little Help needed to walk in hospital room?: A Little Help needed climbing 3-5 steps with a railing? : A Lot 6 Click Score: 19    End of Session Equipment Utilized During Treatment: Oxygen Activity Tolerance: Patient limited by fatigue (which is increased with anxiety when she can't recover breat) Patient left: in chair;with call bell/phone within reach Nurse Communication: Mobility status PT Visit Diagnosis: Other abnormalities of gait and mobility (R26.89)     Time: 6701-4103 PT Time Calculation (min) (ACUTE ONLY): 24 min  Charges:  $Therapeutic Exercise: 8-22 mins $Therapeutic Activity: 8-22 mins                     Meaghan Whistler B. Migdalia Dk PT, DPT Acute Rehabilitation Services Pager [phone] Office [phone]    [place] 09/17/2020, 2:03 PM

## 2020-09-18 LAB — GLUCOSE, CAPILLARY
Glucose-Capillary: 111 mg/dL — ABNORMAL HIGH (ref 70–99)
Glucose-Capillary: 100 mg/dL — ABNORMAL HIGH (ref 70–99)
Glucose-Capillary: 195 mg/dL — ABNORMAL HIGH (ref 70–99)
Glucose-Capillary: 192 mg/dL — ABNORMAL HIGH (ref 70–99)
Glucose-Capillary: 185 mg/dL — ABNORMAL HIGH (ref 70–99)

## 2020-09-18 MED ORDER — PREDNISONE 20 MG PO TABS
40.0000 mg | ORAL_TABLET | Freq: Every day | ORAL | Status: DC
  Administered 2020-09-19 – 2020-09-21 (×3): 40 mg via ORAL
  Filled 2020-09-18 (×3): qty 2

## 2020-09-18 NOTE — Progress Notes (Signed)
Wendy Ruiz  EBX:[id number] DOB: 12/08/1956 DOA: 08/29/2020 PCP: Lyndee Hensen, DO    Brief Narrative:  64 year old with a history of bipolar disorder, obesity, and GERD who was admitted to the hospital 9/18 with severe acute hypoxic respiratory failure and diagnosed with Covid pneumonia. The patient was not vaccinated against Covid. The patient was under the care of PCCM in the ICU and BiPAP dependent for a significant portion of the hospital stay. She was transferred to the hospital service 9/29. At that time she was requiring 50 L of heated high flow nasal cannula support with additional nonrebreather mask support.  Significant Events:  9/18 admit via [place]  9/19 CTa chest no PE -near confluent dense groundglass opacities all lobes bilaterally 9/21 TTE unrevealing 9/23 bilateral lower extremity venous duplex negative 9/29 [facility] assumed [facility] from [facility]  10/2 bilateral lower extremity venous duplex negative 10/3 CTa chest without PE  Date of Positive COVID Test:  9/19  Date Isolation Ends: 10/9  Vaccination Status: Not vaccinated against Covid  COVID-19 specific Treatment: Baricitinib 9/19 > 10/2 Steroid 9/18 > Remdesivir 9/19 > 9/23  Antimicrobials:  Cefepime 9/23 > 9/27 Levaquin 9/29 > 10/2  DVT prophylaxis: Full dose Lovenox  Subjective: Mildly tachycardic at times.  Blood pressure stable.  Mild tachypnea with respiratory rates in the upper 20s intermittently.  Remains on high level oxygen support but some downward titration has been possible.  No new complaints today.  Feels that she is slowly improving.  Assessment & Plan:  COVID Pneumonia -acute severe hypoxic respiratory failure Has completed a course of remdesivir and baricitinib - dosing with steroids -continue to wean oxygen support as able  Elevated D-dimer Given clinical scenario patient was being dosed with full dose Lovenox - CTA x2 and venous duplex lower extremities x2 negative for  thrombus - transitioned to prophylactic dose Lovenox today with falling D-dimer  Indeterminate QuantiFERON Obtained while in ICU -no evidence on CTA chest of active tuberculosis  GERD Continue PPI  Mild tachycardia TSH normal - CTa chest without acute findings  Code Status: FULL CODE Family Communication: No family present at time of exam Status is: Inpatient  Remains inpatient appropriate because:Inpatient level of care appropriate due to severity of illness   Dispo: The patient is from: Home              Anticipated d/c is to: unclear              Anticipated d/c date is: 3 days              Patient currently is not medically stable to d/c.   Consultants:  PCCM  Objective: Blood pressure 98/60, pulse (!) 104, temperature 99 F (37.2 C), temperature source Axillary, resp. rate (!) 29, height 5\' 4"  (1.626 m), weight 108.5 kg, SpO2 100 %.  Intake/Output Summary (Last 24 hours) at 09/18/2020 1001 Last data filed at 09/18/2020 0838 Gross per 24 hour  Intake 500 ml  Output 200 ml  Net 300 ml   Filed Weights   09/03/20 1103 09/04/20 1500 09/07/20 0500  Weight: 112.2 kg 106.9 kg 108.5 kg    Examination: General: Not in extremis  Lungs: Fine diffuse crackles -improving air movement throughout -no wheezing Cardiovascular: RRR without murmur Abdomen: NT/ND, soft, BS positive, no rebound Extremities: No C/C/E B LE   CBC: Recent Labs  Lab 09/15/20 0408 09/16/20 0630 09/17/20 0334  WBC 20.6* 19.7* 15.8*  NEUTROABS 17.8* 17.0* 13.6*  HGB 11.0*  9.8* 9.6*  HCT 37.5 32.7* 32.4*  MCV 89.1 89.6 89.0  PLT 269 267 295   Basic Metabolic Panel: Recent Labs  Lab 09/15/20 0408 09/16/20 0630 09/17/20 0334  NA 138 138 138  K 4.0 4.3 3.9  CL 99 97* 98  CO2 27 29 31   GLUCOSE 95 78 92  BUN 14 13 12   CREATININE 0.73 0.65 0.77  CALCIUM 9.2 8.8* 9.0  MG 2.2 2.1 2.4   GFR: Estimated Creatinine Clearance: 87.7 mL/min (by C-G formula based on SCr of 0.77 mg/dL).  Liver  Function Tests: Recent Labs  Lab 09/14/20 0145 09/15/20 0408 09/16/20 0630 09/17/20 0334  AST 115* 94* 70* 74*  ALT 277* 284* 226* 211*  ALKPHOS 69 75 69 66  BILITOT 0.7 0.9 0.6 0.2*  PROT 6.1* 7.0 6.5 6.4*  ALBUMIN 2.4* 2.8* 2.5* 2.4*    HbA1C: Hgb A1c MFr Bld  Date/Time Value Ref Range Status  08/31/2020 05:18 AM 5.7 (H) 4.8 - 5.6 % Final    Comment:    (NOTE) Pre diabetes:          5.7%-6.4%  Diabetes:              >6.4%  Glycemic control for   <7.0% adults with diabetes     CBG: Recent Labs  Lab 09/17/20 1547 09/17/20 1959 09/17/20 2343 09/18/20 0432 09/18/20 0803  GLUCAP 219* 258* 151* 111* 100*    Recent Results (from the past 240 hour(s))  Expectorated sputum assessment w rflx to resp cult     Status: None   Collection Time: 09/09/20  1:52 PM   Specimen: Expectorated Sputum  Result Value Ref Range Status   Specimen Description EXPECTORATED SPUTUM  Final   Special Requests NONE  Final   Sputum evaluation   Final    Sputum specimen not acceptable for testing.  Please recollect.   Gram Stain Report Called to,Read Back By and Verified With: J. SCOTT RN, AT [phone] 09/11/20 BY Rush Landmark Performed at [facility], [place] [address] [place], [place] [address]    Report Status 09/11/2020 FINAL  Final     Scheduled Meds: . (feeding supplement) PROSource Plus  30 mL Oral BID BM  . Chlorhexidine Gluconate Cloth  6 each Topical Daily  . enoxaparin (LOVENOX) injection  55 mg Subcutaneous Q24H  . feeding supplement (ENSURE ENLIVE)  237 mL Oral TID  . insulin aspart  0-15 Units Subcutaneous TID WC  . insulin detemir  10 Units Subcutaneous Daily  . mouth rinse  15 mL Mouth Rinse BID  . metoprolol tartrate  12.5 mg Oral BID  . pantoprazole  40 mg Oral Q1200  . polyethylene glycol  17 g Oral BID  . predniSONE  60 mg Oral Q breakfast  . sodium chloride flush  10-40 mL Intracatheter Q12H      LOS: 19 days   Cherene Altes, MD [facility] Office  [phone] Pager - Text Page per Amion  If 7PM-7AM, please contact night-coverage per Amion 09/18/2020, 10:01 AM

## 2020-09-18 NOTE — Progress Notes (Signed)
   09/18/20 0759  Assess: MEWS Score  Temp 97.6 F (36.4 C)  BP (!) 112/55  Pulse Rate 100  ECG Heart Rate 100  Resp (!) 30  SpO2 (!) 85 %  Assess: MEWS Score  MEWS Temp 0  MEWS Systolic 0  MEWS Pulse 0  MEWS RR 2  MEWS LOC 0  MEWS Score 2  MEWS Score Color Yellow  Assess: if the MEWS score is Yellow or Red  Were vital signs taken at a resting state? Yes  Focused Assessment No change from prior assessment  Early Detection of Sepsis Score *See Row Information* Low  MEWS guidelines implemented *See Row Information* Yes  Treat  MEWS Interventions Other (Comment) (Assessment pt. and rechecked vitals)  Take Vital Signs  Increase Vital Sign Frequency  Yellow: Q 2hr X 2 then Q 4hr X 2, if remains yellow, continue Q 4hrs  Escalate  MEWS: Escalate Yellow: discuss with charge nurse/RN and consider discussing with provider and RRT  Notify: Charge Nurse/RN  Name of Charge Nurse/RN Notified Delcine  Date Charge Nurse/RN Notified 09/18/20  Time Charge Nurse/RN Notified 0900  Document  Patient Outcome Stabilized after interventions  Progress note created (see row info) Yes

## 2020-09-18 NOTE — Progress Notes (Signed)
Pt had intermittent yellow and red MEWS throughout the evening. VS rechecked and pt continues to be either yellow or green dt HR and RR. Pt shows no s/s of distress and VS currently stable. Dorthula Rue charge RN notified. Endorsed to Public house manager.

## 2020-09-18 NOTE — Progress Notes (Signed)
Occupational Therapy Treatment Patient Details Name: Wendy Ruiz MRN: [id number] DOB: 1956/10/08 Today's Date: 09/18/2020    History of present illness Pt is a 64 y.o. female admitted 08/29/20 not feeling well, apparently had syncopal episode and woke up to EMS present for hospital transport. CXR with bilateral infiltrates; pt hypoxic requiring NRB. PMH includes Bipolar 1, chronic pain, obesity.   OT comments  Pt continues to present with decreased activity tolerance. However, very motivated to participate in therapy and "do what it takes to get home". Focusing beginning of session on purse lip breathing and slowing RR; pt able to reduce RR from 36 to 19 with several minutes of breathing. Attempting to switch to HFNC, but pt unable to maintain SpO2 >85% without NRB. Pt performing sit<>stand and weight shift (L<>R) with Min guard A. SpO2 91-81% on 15L via NRB, RR 20s, and HR 110s. Continue to recommend dc to home with HHOT and will continue to follow acutely as admitted.   SpO2 91-81% on 15L NRB during activity    Follow Up Recommendations  Home health OT;Supervision/Assistance - 24 hour (Pending progress)    Equipment Recommendations  3 in 1 bedside commode    Recommendations for Other Services PT consult    Precautions / Restrictions Precautions Precautions: Fall;Other (comment) Precaution Comments: NRB       Mobility Bed Mobility               General bed mobility comments: In recliner upon arrival  Transfers Overall transfer level: Needs assistance Equipment used: None Transfers: Sit to/from Stand Sit to Stand: Min guard         General transfer comment: Min Guard A for safety. Pt maintaining standing for ~30 sec and performing weight shift from one foot to other. performing this twice    Balance Overall balance assessment: Needs assistance Sitting-balance support: No upper extremity supported;Feet supported Sitting balance-Leahy Scale: Good      Standing balance support: No upper extremity supported;During functional activity Standing balance-Leahy Scale: Fair                             ADL either performed or assessed with clinical judgement   ADL Overall ADL's : Needs assistance/impaired                         Toilet Transfer: Magazine features editor Details (indicate cue type and reason): sit<>stand at recliner with [employer] A; x2         Functional mobility during ADLs: Min guard (sit<>stand only) General ADL Comments: Focused begining of session on slowing breathing rate and performing purse lip breathing as pt reports she "failed" switching to HFNC in place of NRB. Attempting to switch to HFNC, but unable to maintain Spo2 without NRB. Pt then performing sit<>stand x2 and weight shift in standing     Vision       Perception     Praxis      Cognition Arousal/Alertness: Awake/alert Behavior During Therapy: WFL for tasks assessed/performed Overall Cognitive Status: Within Functional Limits for tasks assessed                                          Exercises     Shoulder Instructions       General Comments SpO2 89% on  15L via NRB. RR 37. HR 111. Focused on purse lip, slow breathing. RR dropping to 19. Placed on 15L HFNC with 15L NRB to transition, then taking off NRB and focused on maintaing SpO2 with purse lip breathing. SpO2 dropping quickly to low 80s and 77%. Maintained NRB on during mobility as pt unableto tolerate HFNC and purse lip breathing. SpO2 eleavting to 91% on 15L NRB in standing and dropping to 81% at seated rest break. Focused on slow breathigning techniques; RR 22-28    Pertinent Vitals/ Pain       Pain Assessment: No/denies pain  Home Living                                          Prior Functioning/Environment              Frequency  Min 3X/week        Progress Toward Goals  OT Goals(current goals can now be  found in the care plan section)  Progress towards OT goals: Progressing toward goals  Acute Rehab OT Goals Patient Stated Goal: continue to get stronger OT Goal Formulation: With patient Time For Goal Achievement: 09/24/20 Potential to Achieve Goals: Good ADL Goals Pt Will Perform Grooming: with modified independence;standing;sitting Pt Will Perform Lower Body Dressing: with modified independence;sit to/from stand Pt Will Transfer to Toilet: with modified independence;ambulating;bedside commode Pt Will Perform Toileting - Clothing Manipulation and hygiene: with modified independence;sitting/lateral leans;sit to/from stand Additional ADL Goal #1: Pt will independently verbalize three energy conservation techniques for ADLs and IADLs Additional ADL Goal #2: Pt will independently monitor SpO2 and use purse lip breathing for ADLs  Plan Discharge plan remains appropriate    Co-evaluation                 AM-PAC OT "6 Clicks" Daily Activity     Outcome Measure   Help from another person eating meals?: A Little Help from another person taking care of personal grooming?: A Little Help from another person toileting, which includes using toliet, bedpan, or urinal?: A Little Help from another person bathing (including washing, rinsing, drying)?: A Little Help from another person to put on and taking off regular upper body clothing?: A Little Help from another person to put on and taking off regular lower body clothing?: A Little 6 Click Score: 18    End of Session Equipment Utilized During Treatment: Oxygen (15L via NRB)  OT Visit Diagnosis: Unsteadiness on feet (R26.81);Other abnormalities of gait and mobility (R26.89);Muscle weakness (generalized) (M62.81);Pain   Activity Tolerance Patient tolerated treatment well;Patient limited by fatigue   Patient Left in chair;with call bell/phone within reach   Nurse Communication Mobility status        Time: 3244-0102 OT Time  Calculation (min): 32 min  Charges: OT General Charges $OT Visit: 1 Visit OT Treatments $Therapeutic Activity: 23-37 mins  [place], OTR/L Acute Rehab Pager: [phone] Office: [place] 09/18/2020, 12:55 PM

## 2020-09-19 LAB — GLUCOSE, CAPILLARY
Glucose-Capillary: 128 mg/dL — ABNORMAL HIGH (ref 70–99)
Glucose-Capillary: 100 mg/dL — ABNORMAL HIGH (ref 70–99)
Glucose-Capillary: 81 mg/dL (ref 70–99)
Glucose-Capillary: 168 mg/dL — ABNORMAL HIGH (ref 70–99)
Glucose-Capillary: 250 mg/dL — ABNORMAL HIGH (ref 70–99)
Glucose-Capillary: 199 mg/dL — ABNORMAL HIGH (ref 70–99)

## 2020-09-19 MED ORDER — INSULIN ASPART 100 UNIT/ML ~~LOC~~ SOLN
0.0000 [IU] | Freq: Three times a day (TID) | SUBCUTANEOUS | Status: DC
  Administered 2020-09-19: 3 [IU] via SUBCUTANEOUS
  Administered 2020-09-19: 2 [IU] via SUBCUTANEOUS
  Administered 2020-09-20 (×2): 3 [IU] via SUBCUTANEOUS
  Administered 2020-09-21: 2 [IU] via SUBCUTANEOUS
  Administered 2020-09-21: 3 [IU] via SUBCUTANEOUS
  Administered 2020-09-22: 2 [IU] via SUBCUTANEOUS
  Administered 2020-09-22: 3 [IU] via SUBCUTANEOUS
  Administered 2020-09-23: 2 [IU] via SUBCUTANEOUS
  Administered 2020-09-23: 3 [IU] via SUBCUTANEOUS
  Administered 2020-09-23 – 2020-09-24 (×3): 2 [IU] via SUBCUTANEOUS
  Administered 2020-09-24: 5 [IU] via SUBCUTANEOUS
  Administered 2020-09-25 (×2): 2 [IU] via SUBCUTANEOUS
  Administered 2020-09-25: 3 [IU] via SUBCUTANEOUS
  Administered 2020-09-26: 5 [IU] via SUBCUTANEOUS
  Administered 2020-09-26: 2 [IU] via SUBCUTANEOUS
  Administered 2020-09-27: 3 [IU] via SUBCUTANEOUS
  Administered 2020-09-27: 1 [IU] via SUBCUTANEOUS
  Administered 2020-09-27: 5 [IU] via SUBCUTANEOUS
  Administered 2020-09-28: 9 [IU] via SUBCUTANEOUS
  Administered 2020-09-29: 1 [IU] via SUBCUTANEOUS
  Administered 2020-09-29: 6 [IU] via SUBCUTANEOUS

## 2020-09-19 NOTE — Progress Notes (Signed)
   09/19/20 0437  Assess: MEWS Score  Temp (!) 97.5 F (36.4 C)  BP 96/80  Pulse Rate 85  ECG Heart Rate 87  Resp (!) 21  Level of Consciousness Alert  SpO2 97 %  O2 Device Non-rebreather Mask  Patient Activity (if Appropriate) In chair  O2 Flow Rate (L/min) 13 L/min  Assess: MEWS Score  MEWS Temp 0  MEWS Systolic 1  MEWS Pulse 0  MEWS RR 1  MEWS LOC 0  MEWS Score 2  MEWS Score Color Yellow  Assess: if the MEWS score is Yellow or Red  Were vital signs taken at a resting state? Yes  Focused Assessment No change from prior assessment  Early Detection of Sepsis Score *See Row Information* Low  MEWS guidelines implemented *See Row Information* Yes  Treat  MEWS Interventions Escalated (See documentation below)  Pain Scale 0-10  Pain Score 0  Take Vital Signs  Increase Vital Sign Frequency  Yellow: Q 2hr X 2 then Q 4hr X 2, if remains yellow, continue Q 4hrs  Escalate  MEWS: Escalate Yellow: discuss with charge nurse/RN and consider discussing with provider and RRT  Notify: Charge Nurse/RN  Name of Charge Nurse/RN Notified Nikki, RN  Date Charge Nurse/RN Notified 09/19/20  Time Charge Nurse/RN Notified 0444  Document  Patient Outcome Stabilized after interventions  Progress note created (see row info) Yes   Pt fired a yellow MEWS. Pt showed no s/s of distress, no c/o pain and vs rechecked and pt currently has a green MEWS. Charge RN [employer]. Will endorse to oncoming RN.

## 2020-09-19 NOTE — Progress Notes (Signed)
Wendy Ruiz  WRU:[id number] DOB: 1956-03-14 DOA: 08/29/2020 PCP: Lyndee Hensen, DO    Brief Narrative:  64yo with a history of bipolar disorder, obesity, and GERD who was admitted to the hospital 9/18 with severe acute hypoxic respiratory failure and diagnosed with Covid pneumonia. The patient was not vaccinated against Covid. The patient was under the care of PCCM in the ICU and BiPAP dependent for a significant portion of the hospital stay. She was transferred to the hospital service 9/29. At that time she was requiring 50 L of heated high flow nasal cannula support with additional nonrebreather mask support.  Significant Events:  9/18 admit via [place]  9/19 CTa chest no PE -near confluent dense groundglass opacities all lobes bilaterally 9/21 TTE unrevealing 9/23 bilateral lower extremity venous duplex negative 9/29 [facility] assumed [facility] from [facility]  10/2 bilateral lower extremity venous duplex negative 10/3 CTa chest without PE  Date of Positive COVID Test:  9/19  Date Isolation Ends: 10/9  Vaccination Status: Not vaccinated against Covid  COVID-19 specific Treatment: Baricitinib 9/19 > 10/2 Steroid 9/18 > Remdesivir 9/19 > 9/23  Antimicrobials:  Cefepime 9/23 > 9/27 Levaquin 9/29 > 10/2  DVT prophylaxis: Full dose Lovenox  Subjective: Oxygen saturations are now remaining stable on nonrebreather alone.  Afebrile.  Mild tachypnea and intermittent mild tachycardia. No new complaints today. Motivated to get moving and be d/c home.   Assessment & Plan:  COVID Pneumonia -acute severe hypoxic respiratory failure Has completed a course of remdesivir and baricitinib - dosing with steroids -continue to wean oxygen support as able -with ongoing clinical improvement patient is able to come off isolation today in that she is now 21 days post her positive test  Elevated D-dimer Given clinical scenario patient was being dosed with full dose Lovenox - CTA x2 and  venous duplex lower extremities x2 negative for thrombus - transitioned to prophylactic dose Lovenox today with falling D-dimer  Indeterminate QuantiFERON Obtained while in ICU -no evidence on CTA chest of active tuberculosis  GERD Continue PPI  Mild tachycardia TSH normal - CTa chest without acute findings  Code Status: FULL CODE Family Communication: No family present at time of exam Status is: Inpatient  Remains inpatient appropriate because:Inpatient level of care appropriate due to severity of illness   Dispo: The patient is from: Home              Anticipated d/c is to: unclear              Anticipated d/c date is: 3 days              Patient currently is not medically stable to d/c.   Consultants:  PCCM  Objective: Blood pressure 107/70, pulse 83, temperature 98.1 F (36.7 C), temperature source Axillary, resp. rate (!) 24, height 5\' 4"  (1.626 m), weight 108.5 kg, SpO2 99 %.  Intake/Output Summary (Last 24 hours) at 09/19/2020 0956 Last data filed at 09/18/2020 1800 Gross per 24 hour  Intake 290 ml  Output --  Net 290 ml   Filed Weights   09/03/20 1103 09/04/20 1500 09/07/20 0500  Weight: 112.2 kg 106.9 kg 108.5 kg    Examination: General: No acute distress  Lungs: Fine diffuse crackles w/ no wheezing  Cardiovascular: RRR - no M or rub  Abdomen: NT/ND, soft, BS positive, no rebound Extremities: No C/C/E B lower extremities    CBC: Recent Labs  Lab 09/15/20 0408 09/16/20 0630 09/17/20 0334  WBC  20.6* 19.7* 15.8*  NEUTROABS 17.8* 17.0* 13.6*  HGB 11.0* 9.8* 9.6*  HCT 37.5 32.7* 32.4*  MCV 89.1 89.6 89.0  PLT 269 267 194   Basic Metabolic Panel: Recent Labs  Lab 09/15/20 0408 09/16/20 0630 09/17/20 0334  NA 138 138 138  K 4.0 4.3 3.9  CL 99 97* 98  CO2 27 29 31   GLUCOSE 95 78 92  BUN 14 13 12   CREATININE 0.73 0.65 0.77  CALCIUM 9.2 8.8* 9.0  MG 2.2 2.1 2.4   GFR: Estimated Creatinine Clearance: 87.7 mL/min (by C-G formula based on SCr  of 0.77 mg/dL).  Liver Function Tests: Recent Labs  Lab 09/14/20 0145 09/15/20 0408 09/16/20 0630 09/17/20 0334  AST 115* 94* 70* 74*  ALT 277* 284* 226* 211*  ALKPHOS 69 75 69 66  BILITOT 0.7 0.9 0.6 0.2*  PROT 6.1* 7.0 6.5 6.4*  ALBUMIN 2.4* 2.8* 2.5* 2.4*    HbA1C: Hgb A1c MFr Bld  Date/Time Value Ref Range Status  08/31/2020 05:18 AM 5.7 (H) 4.8 - 5.6 % Final    Comment:    (NOTE) Pre diabetes:          5.7%-6.4%  Diabetes:              >6.4%  Glycemic control for   <7.0% adults with diabetes     CBG: Recent Labs  Lab 09/18/20 1606 09/18/20 2011 09/19/20 0100 09/19/20 0440 09/19/20 0743  GLUCAP 192* 185* 128* 100* 81    Recent Results (from the past 240 hour(s))  Expectorated sputum assessment w rflx to resp cult     Status: None   Collection Time: 09/09/20  1:52 PM   Specimen: Expectorated Sputum  Result Value Ref Range Status   Specimen Description EXPECTORATED SPUTUM  Final   Special Requests NONE  Final   Sputum evaluation   Final    Sputum specimen not acceptable for testing.  Please recollect.   Gram Stain Report Called to,Read Back By and Verified With: J. SCOTT RN, AT [phone] 09/11/20 BY Rush [facility]mark Performed at [facility], [place] [address] [place], [place] [address]    Report Status 09/11/2020 FINAL  Final     Scheduled Meds: . (feeding supplement) PROSource Plus  30 mL Oral BID BM  . Chlorhexidine Gluconate Cloth  6 each Topical Daily  . enoxaparin (LOVENOX) injection  55 mg Subcutaneous Q24H  . feeding supplement (ENSURE ENLIVE)  237 mL Oral TID  . insulin aspart  0-15 Units Subcutaneous TID WC  . insulin detemir  10 Units Subcutaneous Daily  . mouth rinse  15 mL Mouth Rinse BID  . metoprolol tartrate  12.5 mg Oral BID  . pantoprazole  40 mg Oral Q1200  . polyethylene glycol  17 g Oral BID  . predniSONE  40 mg Oral Q breakfast  . sodium chloride flush  10-40 mL Intracatheter Q12H      LOS: 20 days   Cherene Altes,  MD [facility] Office  [phone] Pager - Text Page per Amion  If 7PM-7AM, please contact night-coverage per Amion 09/19/2020, 9:56 AM

## 2020-09-20 LAB — CBC
HCT: 35.5 % — ABNORMAL LOW (ref 36.0–46.0)
Hemoglobin: 10.5 g/dL — ABNORMAL LOW (ref 12.0–15.0)
MCH: 26.8 pg (ref 26.0–34.0)
MCHC: 29.6 g/dL — ABNORMAL LOW (ref 30.0–36.0)
MCV: 90.6 fL (ref 80.0–100.0)
Platelets: 201 10*3/uL (ref 150–400)
RBC: 3.92 MIL/uL (ref 3.87–5.11)
RDW: 13.6 % (ref 11.5–15.5)
WBC: 14.9 10*3/uL — ABNORMAL HIGH (ref 4.0–10.5)
nRBC: 0.3 % — ABNORMAL HIGH (ref 0.0–0.2)

## 2020-09-20 LAB — COMPREHENSIVE METABOLIC PANEL
ALT: 211 U/L — ABNORMAL HIGH (ref 0–44)
AST: 60 U/L — ABNORMAL HIGH (ref 15–41)
Albumin: 2.7 g/dL — ABNORMAL LOW (ref 3.5–5.0)
Alkaline Phosphatase: 54 U/L (ref 38–126)
Anion gap: 9 (ref 5–15)
BUN: 17 mg/dL (ref 8–23)
CO2: 31 mmol/L (ref 22–32)
Calcium: 9 mg/dL (ref 8.9–10.3)
Chloride: 96 mmol/L — ABNORMAL LOW (ref 98–111)
Creatinine, Ser: 0.69 mg/dL (ref 0.44–1.00)
GFR, Estimated: >60 mL/min (ref >60)
Glucose, Bld: 98 mg/dL (ref 70–99)
Potassium: 4.4 mmol/L (ref 3.5–5.1)
Sodium: 136 mmol/L (ref 135–145)
Total Bilirubin: 0.5 mg/dL (ref 0.3–1.2)
Total Protein: 6.7 g/dL (ref 6.5–8.1)

## 2020-09-20 LAB — GLUCOSE, CAPILLARY
Glucose-Capillary: 172 mg/dL — ABNORMAL HIGH (ref 70–99)
Glucose-Capillary: 195 mg/dL — ABNORMAL HIGH (ref 70–99)
Glucose-Capillary: 196 mg/dL — ABNORMAL HIGH (ref 70–99)
Glucose-Capillary: 234 mg/dL — ABNORMAL HIGH (ref 70–99)
Glucose-Capillary: 94 mg/dL (ref 70–99)

## 2020-09-20 LAB — BRAIN NATRIURETIC PEPTIDE: B Natriuretic Peptide: 28.3 pg/mL (ref 0.0–100.0)

## 2020-09-20 LAB — MAGNESIUM: Magnesium: 2.3 mg/dL (ref 1.7–2.4)

## 2020-09-20 MED ORDER — FUROSEMIDE 10 MG/ML IJ SOLN
40.0000 mg | Freq: Once | INTRAMUSCULAR | Status: DC
  Filled 2020-09-20: qty 4

## 2020-09-20 NOTE — Progress Notes (Addendum)
Sent secure chat to Dr. Thereasa Solo:  Pt. Tele d/c yesterday.  Pt. admint 9/18 can she come off isolation?  Awaiting response.  Dr. Thereasa Solo said she could come off isolation and moved off of our covid floor

## 2020-09-20 NOTE — Plan of Care (Signed)
Patient is currently resting in bed. Denies pain. VSS. Remains on NRB 15L for comfort. Call bell within reach. No complaints overnight.   Problem: Education: Goal: Knowledge of risk factors and measures for prevention of condition will improve Outcome: Progressing   Problem: Respiratory: Goal: Will maintain a patent airway Outcome: Progressing   Problem: Education: Goal: Knowledge of General Education information will improve Description: Including pain rating scale, medication(s)/side effects and non-pharmacologic comfort measures Outcome: Progressing   Problem: Health Behavior/Discharge Planning: Goal: Ability to manage health-related needs will improve Outcome: Progressing   Problem: Clinical Measurements: Goal: Respiratory complications will improve Outcome: Progressing   Problem: Activity: Goal: Risk for activity intolerance will decrease Outcome: Progressing   Problem: Nutrition: Goal: Adequate nutrition will be maintained Outcome: Progressing   Problem: Clinical Measurements: Goal: Ability to maintain clinical measurements within normal limits will improve Outcome: Progressing Goal: Will remain free from infection Outcome: Progressing Goal: Diagnostic test results will improve Outcome: Progressing Goal: Cardiovascular complication will be avoided Outcome: Progressing   Problem: Coping: Goal: Level of anxiety will decrease Outcome: Progressing   Problem: Elimination: Goal: Will not experience complications related to bowel motility Outcome: Progressing Goal: Will not experience complications related to urinary retention Outcome: Progressing   Problem: Pain Managment: Goal: General experience of comfort will improve Outcome: Progressing   Problem: Safety: Goal: Ability to remain free from injury will improve Outcome: Progressing   Problem: Skin Integrity: Goal: Risk for impaired skin integrity will decrease Outcome: Progressing

## 2020-09-20 NOTE — Progress Notes (Signed)
Wendy Ruiz  AST:[id number] DOB: 09-04-1956 DOA: 08/29/2020 PCP: Lyndee Hensen, DO    Brief Narrative:  64yo with a history of bipolar disorder, obesity, and GERD who was admitted to the hospital 9/18 with severe acute hypoxic respiratory failure and diagnosed with Covid pneumonia. The patient was not vaccinated against Covid. The patient was under the care of PCCM in the ICU and BiPAP dependent for a significant portion of the hospital stay. She was transferred to the hospital service 9/29. At that time she was requiring 50 L of heated high flow nasal cannula support with additional nonrebreather mask support.  Significant Events:  9/18 admit via [place]  9/19 CTa chest no PE -near confluent dense groundglass opacities all lobes bilaterally 9/21 TTE unrevealing 9/23 bilateral lower extremity venous duplex negative 9/29 [facility] assumed care from [facility]  10/2 bilateral lower extremity venous duplex negative 10/3 CTa chest without PE  Date of Positive COVID Test:  9/19  Date Isolation Ends: 10/9  Vaccination Status: Not vaccinated against Covid  COVID-19 specific Treatment: Baricitinib 9/19 > 10/2 Steroid 9/18 > Remdesivir 9/19 > 9/23  Antimicrobials:  Cefepime 9/23 > 9/27 Levaquin 9/29 > 10/2  DVT prophylaxis: Full dose Lovenox  Subjective: Afebrile.  Mildly tachycardic at 110.  Intermittent mild tachypnea.  Oxygen saturations 92-100% on 15 L nonrebreather.  No new questions or complaints today.  Assessment & Plan:  COVID Pneumonia -acute severe hypoxic respiratory failure Has completed a course of remdesivir and baricitinib - dosing with very slow tapering dose of steroids -continue to wean oxygen support as able -with ongoing clinical improvement patient was able to come off isolation 10/9  Elevated D-dimer Given clinical scenario patient was being dosed with full dose Lovenox - CTA x2 and venous duplex lower extremities x2 negative for thrombus -  transitioned to prophylactic dose Lovenox today with falling D-dimer  Indeterminate QuantiFERON Obtained while in ICU -no evidence on CTA chest of active tuberculosis  GERD Continue PPI  Mild tachycardia TSH normal - CTa chest without acute findings  Code Status: FULL CODE Family Communication: No family present at time of exam Status is: Inpatient  Remains inpatient appropriate because:Inpatient level of care appropriate due to severity of illness   Dispo: The patient is from: Home              Anticipated d/c is to: unclear              Anticipated d/c date is: 3 days              Patient currently is not medically stable to d/c.   Consultants:  PCCM  Objective: Blood pressure 131/71, pulse 97, temperature (!) 97.5 F (36.4 C), temperature source Axillary, resp. rate 20, height 5\' 4"  (1.626 m), weight 108.5 kg, SpO2 92 %.  Intake/Output Summary (Last 24 hours) at 09/20/2020 1002 Last data filed at 09/20/2020 0436 Gross per 24 hour  Intake 240 ml  Output 350 ml  Net -110 ml   Filed Weights   09/03/20 1103 09/04/20 1500 09/07/20 0500  Weight: 112.2 kg 106.9 kg 108.5 kg    Examination: General: No acute distress  Lungs: Fine diffuse crackles without change Cardiovascular: RRR - no M or rub  Abdomen: NT/ND, soft, BS positive, no rebound Extremities: No C/C/E B lower extremities    CBC: Recent Labs  Lab 09/15/20 0408 09/15/20 0408 09/16/20 0630 09/17/20 0334 09/20/20 0500  WBC 20.6*   < > 19.7* 15.8* 14.9*  NEUTROABS 17.8*  --  17.0* 13.6*  --   HGB 11.0*   < > 9.8* 9.6* 10.5*  HCT 37.5   < > 32.7* 32.4* 35.5*  MCV 89.1   < > 89.6 89.0 90.6  PLT 269   < > 267 258 201   < > = values in this interval not displayed.   Basic Metabolic Panel: Recent Labs  Lab 09/16/20 0630 09/17/20 0334 09/20/20 0500  NA 138 138 136  K 4.3 3.9 4.4  CL 97* 98 96*  CO2 29 31 31   GLUCOSE 78 92 98  BUN 13 12 17   CREATININE 0.65 0.77 0.69  CALCIUM 8.8* 9.0 9.0  MG  2.1 2.4 2.3   GFR: Estimated Creatinine Clearance: 87.7 mL/min (by C-G formula based on SCr of 0.69 mg/dL).  Liver Function Tests: Recent Labs  Lab 09/15/20 0408 09/16/20 0630 09/17/20 0334 09/20/20 0500  AST 94* 70* 74* 60*  ALT 284* 226* 211* 211*  ALKPHOS 75 69 66 54  BILITOT 0.9 0.6 0.2* 0.5  PROT 7.0 6.5 6.4* 6.7  ALBUMIN 2.8* 2.5* 2.4* 2.7*    HbA1C: Hgb A1c MFr Bld  Date/Time Value Ref Range Status  08/31/2020 05:18 AM 5.7 (H) 4.8 - 5.6 % Final    Comment:    (NOTE) Pre diabetes:          5.7%-6.4%  Diabetes:              >6.4%  Glycemic control for   <7.0% adults with diabetes     CBG: Recent Labs  Lab 09/19/20 1159 09/19/20 1642 09/19/20 2024 09/20/20 0001 09/20/20 0736  GLUCAP 168* 250* 199* 195* 94    No results found for this or any previous visit (from the past 240 hour(s)).   Scheduled Meds: . (feeding supplement) PROSource Plus  30 mL Oral BID BM  . Chlorhexidine Gluconate Cloth  6 each Topical Daily  . enoxaparin (LOVENOX) injection  55 mg Subcutaneous Q24H  . feeding supplement (ENSURE ENLIVE)  237 mL Oral TID  . insulin aspart  0-9 Units Subcutaneous TID WC  . mouth rinse  15 mL Mouth Rinse BID  . metoprolol tartrate  12.5 mg Oral BID  . pantoprazole  40 mg Oral Q1200  . polyethylene glycol  17 g Oral BID  . predniSONE  40 mg Oral Q breakfast  . sodium chloride flush  10-40 mL Intracatheter Q12H      LOS: 21 days   Cherene Altes, MD [facility] Office  [phone] Pager - Text Page per Amion  If 7PM-7AM, please contact night-coverage per Amion 09/20/2020, 10:02 AM

## 2020-09-21 LAB — GLUCOSE, CAPILLARY
Glucose-Capillary: 90 mg/dL (ref 70–99)
Glucose-Capillary: 186 mg/dL — ABNORMAL HIGH (ref 70–99)
Glucose-Capillary: 212 mg/dL — ABNORMAL HIGH (ref 70–99)
Glucose-Capillary: 177 mg/dL — ABNORMAL HIGH (ref 70–99)

## 2020-09-21 MED ORDER — PREDNISONE 20 MG PO TABS
20.0000 mg | ORAL_TABLET | Freq: Every day | ORAL | Status: DC
  Administered 2020-09-22 – 2020-09-26 (×5): 20 mg via ORAL
  Filled 2020-09-21 (×5): qty 1

## 2020-09-21 NOTE — Plan of Care (Signed)
Patient is currently resting in chair. Denies pain. Skin intact. VSS. Remains on NRB for comfort. OOB to [facility]. Call bell within reach.   Problem: Education: Goal: Knowledge of risk factors and measures for prevention of condition will improve Outcome: Progressing   Problem: Respiratory: Goal: Will maintain a patent airway Outcome: Progressing   Problem: Education: Goal: Knowledge of General Education information will improve Description: Including pain rating scale, medication(s)/side effects and non-pharmacologic comfort measures Outcome: Progressing   Problem: Health Behavior/Discharge Planning: Goal: Ability to manage health-related needs will improve Outcome: Progressing   Problem: Clinical Measurements: Goal: Respiratory complications will improve Outcome: Progressing   Problem: Activity: Goal: Risk for activity intolerance will decrease Outcome: Progressing   Problem: Nutrition: Goal: Adequate nutrition will be maintained Outcome: Progressing   Problem: Clinical Measurements: Goal: Ability to maintain clinical measurements within normal limits will improve Outcome: Progressing Goal: Will remain free from infection Outcome: Progressing Goal: Diagnostic test results will improve Outcome: Progressing Goal: Cardiovascular complication will be avoided Outcome: Progressing   Problem: Coping: Goal: Level of anxiety will decrease Outcome: Progressing   Problem: Elimination: Goal: Will not experience complications related to bowel motility Outcome: Progressing Goal: Will not experience complications related to urinary retention Outcome: Progressing   Problem: Pain Managment: Goal: General experience of comfort will improve Outcome: Progressing   Problem: Safety: Goal: Ability to remain free from injury will improve Outcome: Progressing   Problem: Skin Integrity: Goal: Risk for impaired skin integrity will decrease Outcome: Progressing

## 2020-09-21 NOTE — Progress Notes (Signed)
Wendy Ruiz  PRF:[id number] DOB: 12-05-1956 DOA: 08/29/2020 PCP: Lyndee Hensen, DO    Brief Narrative:  64yo with a history of bipolar disorder, obesity, and GERD who was admitted to the hospital 9/18 with severe acute hypoxic respiratory failure and diagnosed with Covid pneumonia. The patient was not vaccinated against Covid. The patient was under the care of PCCM in the ICU and BiPAP dependent for a significant portion of the hospital stay. She was transferred to the hospital service 9/29. At that time she was requiring 50 L of heated high flow nasal cannula support with additional nonrebreather mask support.  Significant Events:  9/18 admit via [place]  9/19 CTa chest no PE -near confluent dense groundglass opacities all lobes bilaterally 9/21 TTE unrevealing 9/23 bilateral lower extremity venous duplex negative 9/29 [facility] assumed care from [facility]  10/2 bilateral lower extremity venous duplex negative 10/3 CTa chest without PE  Date of Positive COVID Test:  9/19  Date Isolation Ends: 10/9  Vaccination Status: Not vaccinated against Covid  COVID-19 specific Treatment: Baricitinib 9/19 > 10/2 Steroid 9/18 > Remdesivir 9/19 > 9/23  Antimicrobials:  Cefepime 9/23 > 9/27 Levaquin 9/29 > 10/2  DVT prophylaxis: Full dose Lovenox  Subjective: Afebrile.  Vital signs stable with mild hypotension.  Oxygen saturations favorable at 100% on nonrebreather.  Making very slow progress but is improving.  Assessment & Plan:  COVID Pneumonia -acute severe hypoxic respiratory failure Has completed a course of remdesivir and baricitinib - dosing with very slow tapering dose of steroids -continue to wean oxygen support as able -with ongoing clinical improvement patient was able to come off isolation 10/9  Elevated D-dimer Given clinical scenario patient was being dosed with full dose Lovenox - CTA x2 and venous duplex lower extremities x2 negative for thrombus -  transitioned to prophylactic dose Lovenox today with falling D-dimer  Indeterminate QuantiFERON Obtained while in ICU -no evidence on CTA chest of active tuberculosis  GERD Continue PPI  Mild tachycardia TSH normal - CTa chest without acute findings  Code Status: FULL CODE Family Communication: No family present at time of exam Status is: Inpatient  Remains inpatient appropriate because:Inpatient level of care appropriate due to severity of illness   Dispo: The patient is from: Home              Anticipated d/c is to: unclear              Anticipated d/c date is: 3 days              Patient currently is not medically stable to d/c.   Consultants:  PCCM  Objective: Blood pressure 90/73, pulse 74, temperature 98.6 F (37 C), temperature source Oral, resp. rate 18, height 5\' 4"  (1.626 m), weight 108.5 kg, SpO2 100 %.  Intake/Output Summary (Last 24 hours) at 09/21/2020 1033 Last data filed at 09/20/2020 2149 Gross per 24 hour  Intake --  Output 276 ml  Net -276 ml   Filed Weights   09/03/20 1103 09/04/20 1500 09/07/20 0500  Weight: 112.2 kg 106.9 kg 108.5 kg    Examination: General: No acute distress - alert and pleasant  Lungs: Fine diffuse crackles - improved air movement th/o  Cardiovascular: RRR - no M or rub  Abdomen: NT/ND, soft, BS positive, no rebound Extremities: No C/C/E B LE   CBC: Recent Labs  Lab 09/15/20 0408 09/15/20 0408 09/16/20 0630 09/17/20 0334 09/20/20 0500  WBC 20.6*   < > 19.7*  15.8* 14.9*  NEUTROABS 17.8*  --  17.0* 13.6*  --   HGB 11.0*   < > 9.8* 9.6* 10.5*  HCT 37.5   < > 32.7* 32.4* 35.5*  MCV 89.1   < > 89.6 89.0 90.6  PLT 269   < > 267 258 201   < > = values in this interval not displayed.   Basic Metabolic Panel: Recent Labs  Lab 09/16/20 0630 09/17/20 0334 09/20/20 0500  NA 138 138 136  K 4.3 3.9 4.4  CL 97* 98 96*  CO2 29 31 31   GLUCOSE 78 92 98  BUN 13 12 17   CREATININE 0.65 0.77 0.69  CALCIUM 8.8* 9.0 9.0   MG 2.1 2.4 2.3   GFR: Estimated Creatinine Clearance: 87.7 mL/min (by C-G formula based on SCr of 0.69 mg/dL).  Liver Function Tests: Recent Labs  Lab 09/15/20 0408 09/16/20 0630 09/17/20 0334 09/20/20 0500  AST 94* 70* 74* 60*  ALT 284* 226* 211* 211*  ALKPHOS 75 69 66 54  BILITOT 0.9 0.6 0.2* 0.5  PROT 7.0 6.5 6.4* 6.7  ALBUMIN 2.8* 2.5* 2.4* 2.7*    HbA1C: Hgb A1c MFr Bld  Date/Time Value Ref Range Status  08/31/2020 05:18 AM 5.7 (H) 4.8 - 5.6 % Final    Comment:    (NOTE) Pre diabetes:          5.7%-6.4%  Diabetes:              >6.4%  Glycemic control for   <7.0% adults with diabetes     CBG: Recent Labs  Lab 09/20/20 0736 09/20/20 1219 09/20/20 1624 09/20/20 2146 09/21/20 0745  GLUCAP 94 196* 234* 172* 90    No results found for this or any previous visit (from the past 240 hour(s)).   Scheduled Meds: . (feeding supplement) PROSource Plus  30 mL Oral BID BM  . Chlorhexidine Gluconate Cloth  6 each Topical Daily  . enoxaparin (LOVENOX) injection  55 mg Subcutaneous Q24H  . feeding supplement (ENSURE ENLIVE)  237 mL Oral TID  . insulin aspart  0-9 Units Subcutaneous TID WC  . mouth rinse  15 mL Mouth Rinse BID  . metoprolol tartrate  12.5 mg Oral BID  . pantoprazole  40 mg Oral Q1200  . polyethylene glycol  17 g Oral BID  . predniSONE  40 mg Oral Q breakfast  . sodium chloride flush  10-40 mL Intracatheter Q12H      LOS: 22 days   Cherene Altes, MD [facility] Office  [phone] Pager - Text Page per Amion  If 7PM-7AM, please contact night-coverage per Amion 09/21/2020, 10:33 AM

## 2020-09-21 NOTE — Progress Notes (Signed)
Physical Therapy Treatment Patient Details Name: Wendy Ruiz MRN: [id number] DOB: July 09, 1956 Today's Date: 09/21/2020    History of Present Illness Pt is a 64 y.o. female admitted 08/29/20 not feeling well, apparently had syncopal episode and woke up to EMS present for hospital transport. CXR with bilateral infiltrates; pt hypoxic requiring NRB. PMH includes Bipolar 1, chronic pain, obesity.    PT Comments    Pt in recliner on arrival. She performed LE exercises, reclined with feet elevated and upright with feet on floor. Static standing x 2 trials. 1 minute per trial. Min guard assist transfer and standing. Pt on 15L NRB. SpO2 99% at rest prior to mobility. Desat to 85% during each standing trial. Max HR 117. 3-5 minute recovery time to return to 90%.    Follow Up Recommendations  Home health PT;Supervision for mobility/OOB (may need to consider CIR)     Equipment Recommendations  Rolling walker with 5" wheels;Other (comment) (needs further assessment)    Recommendations for Other Services       Precautions / Restrictions Precautions Precautions: Fall;Other (comment) Precaution Comments: NRB    Mobility  Bed Mobility               General bed mobility comments: In recliner upon arrival  Transfers Overall transfer level: Needs assistance Equipment used: None Transfers: Sit to/from Stand Sit to Stand: Min guard         General transfer comment: min guard for safety. Sit to stand x 2 trials  Ambulation/Gait                 Stairs             Wheelchair Mobility    Modified Rankin (Stroke Patients Only)       Balance Overall balance assessment: Needs assistance Sitting-balance support: No upper extremity supported;Feet supported Sitting balance-Leahy Scale: Good     Standing balance support: No upper extremity supported;During functional activity Standing balance-Leahy Scale: Fair Standing balance comment: Static standing x 2  trials. 1 min/trial. Min guard assist.                            Cognition Arousal/Alertness: Awake/alert Behavior During Therapy: WFL for tasks assessed/performed Overall Cognitive Status: Within Functional Limits for tasks assessed                                 General Comments: anxiety with SoB and O2 desaturation       Exercises General Exercises - Lower Extremity Ankle Circles/Pumps: AROM;Both;10 reps;Supine Long Arc Quad: AROM;Right;Left;10 reps;Seated Heel Slides: AROM;Right;Left;10 reps;Supine Hip ABduction/ADduction: AROM;Right;Left;10 reps;Supine Straight Leg Raises: AAROM;Right;Left;10 reps;Supine    General Comments General comments (skin integrity, edema, etc.): SpO2 99% on 15L NRB at rest. Resting HR 100. Desat to 85% during each 1-minute standing trial on 15L NRB. Max HR 117. 3-5 minute recovery time to return to 90%.      Pertinent Vitals/Pain Pain Assessment: Faces Faces Pain Scale: Hurts a little bit Pain Location: chest with coughing Pain Descriptors / Indicators: Grimacing;Tightness;Discomfort Pain Intervention(s): Monitored during session;Limited activity within patient's tolerance;Repositioned    Home Living                      Prior Function            PT Goals (current goals can now be found in  the care plan section) Acute Rehab PT Goals Patient Stated Goal: continue to get stronger Progress towards PT goals: Progressing toward goals    Frequency    Min 3X/week      PT Plan Current plan remains appropriate (watching closely, may need CIR)    Co-evaluation              AM-PAC PT "6 Clicks" Mobility   Outcome Measure  Help needed turning from your back to your side while in a flat bed without using bedrails?: None Help needed moving from lying on your back to sitting on the side of a flat bed without using bedrails?: None Help needed moving to and from a bed to a chair (including a  wheelchair)?: A Little Help needed standing up from a chair using your arms (e.g., wheelchair or bedside chair)?: A Little Help needed to walk in hospital room?: A Little Help needed climbing 3-5 steps with a railing? : A Lot 6 Click Score: 19    End of Session Equipment Utilized During Treatment: Oxygen Activity Tolerance: Patient tolerated treatment well Patient left: in chair;with call bell/phone within reach Nurse Communication: Mobility status PT Visit Diagnosis: Other abnormalities of gait and mobility (R26.89)     Time: 1130-1153 PT Time Calculation (min) (ACUTE ONLY): 23 min  Charges:  $Gait Training: 8-22 mins $Therapeutic Exercise: 8-22 mins                     Lorrin Goodell, PT  Office # [phone] Pager [phone]    Lorriane Shire 09/21/2020, 12:51 PM

## 2020-09-21 NOTE — Plan of Care (Signed)
  Problem: Education: Goal: Knowledge of risk factors and measures for prevention of condition will improve Outcome: Progressing   

## 2020-09-22 LAB — GLUCOSE, CAPILLARY
Glucose-Capillary: 95 mg/dL (ref 70–99)
Glucose-Capillary: 187 mg/dL — ABNORMAL HIGH (ref 70–99)
Glucose-Capillary: 212 mg/dL — ABNORMAL HIGH (ref 70–99)
Glucose-Capillary: 238 mg/dL — ABNORMAL HIGH (ref 70–99)

## 2020-09-22 MED ORDER — FUROSEMIDE 10 MG/ML IJ SOLN
40.0000 mg | Freq: Once | INTRAMUSCULAR | Status: AC
  Administered 2020-09-22: 40 mg via INTRAVENOUS
  Filled 2020-09-22: qty 4

## 2020-09-22 NOTE — Progress Notes (Signed)
Nutrition Follow-up  DOCUMENTATION CODES:   Obesity unspecified  INTERVENTION:  Continue Ensure Enlive po TID, each supplement provides 350 kcal and 20 grams of protein.  Continue 30 ml Prosource Plus po BID, each supplement provides 100 kcal and 15 grams of protein.   Provide Magic cup TID with meals, each supplement provides 290 kcal and 9 grams of protein  Encourage adequate PO intake.   NUTRITION DIAGNOSIS:   Increased nutrient needs related to acute illness (COVID PNA) as evidenced by estimated needs; ongoing  GOAL:   Patient will meet greater than or equal to 90% of their needs; progressing  MONITOR:   PO intake, Supplement acceptance, Weight trends, Labs, I & O's  REASON FOR ASSESSMENT:   Rounds    ASSESSMENT:   Patient with PMH significant for bipolar disorder and GERD. Presents this admission with COVID PNA.  Pt is currently on 15 L HFNC via NRB. Pt has been slowly improving. Meal completion has been 60-80%. Pt currently has Ensure and Prostat ordered and has been consuming them. Will additionally continue to provide Magic cup at meals to aid in adequate nutrition. Pt encouraged to eat her food at meals and to drink her supplements.   Labs and medications reviewed.   Diet Order:   Diet Order            Diet regular Room service appropriate? Yes; Fluid consistency: Thin  Diet effective now                 EDUCATION NEEDS:   Not appropriate for education at this time  Skin:  Skin Assessment: Reviewed RN Assessment  Last BM:  10/9  Height:   Ht Readings from Last 1 Encounters:  08/29/20 5\' 4"  (1.626 m)    Weight:   Wt Readings from Last 1 Encounters:  09/07/20 108.5 kg   BMI:  Body mass index is 41.06 kg/m.  Estimated Nutritional Needs:   Kcal:  7622-6333 kcal  Protein:  109-136 g  Fluid:  >/= 2 L/day  Corrin Parker, MS, RD, LDN RD pager number/after hours weekend pager number on Amion.

## 2020-09-22 NOTE — Progress Notes (Signed)
Attempted to switch pt over to HFNC twice this shift. Pt would desat to 81% both times when switched over. Observed that pt is a mouth breather when wearing the NRB but when she was switched to HFNC she only breathes in and out thru her nose ojnly. Instructed pt to breathe in thru nose and out thru mouth.

## 2020-09-22 NOTE — Progress Notes (Signed)
Wendy Ruiz  TGG:[id number] DOB: 1956/04/16 DOA: 08/29/2020 PCP: Lyndee Hensen, DO    Brief Narrative:  64yo with a history of bipolar disorder, obesity, and GERD who was admitted to the hospital 9/18 with severe acute hypoxic respiratory failure and diagnosed with Covid pneumonia. The patient was not vaccinated against Covid. The patient was under the care of PCCM in the ICU and BiPAP dependent for a significant portion of the hospital stay. She was transferred to the hospital service 9/29. At that time she was requiring 50 L of heated high flow nasal cannula support with additional nonrebreather mask support.  Significant Events:  9/18 admit via [place]  9/19 CTa chest no PE -near confluent dense groundglass opacities all lobes bilaterally 9/21 TTE unrevealing 9/23 bilateral lower extremity venous duplex negative 9/29 [facility] assumed care from [facility]  10/2 bilateral lower extremity venous duplex negative 10/3 CTa chest without PE  Date of Positive COVID Test:  9/19  Date Isolation Ends: 10/9  Vaccination Status: Not vaccinated against Covid  COVID-19 specific Treatment: Baricitinib 9/19 > 10/2 Steroid 9/18 > Remdesivir 9/19 > 9/23  Antimicrobials:  Cefepime 9/23 > 9/27 Levaquin 9/29 > 10/2  DVT prophylaxis: Full dose Lovenox  Subjective: Afebrile. VSS. Remains on NRB but saturations are 95-98% so I suspect she can be titrated down. No new complaints today.   Assessment & Plan:  COVID Pneumonia -acute severe hypoxic respiratory failure Has completed a course of remdesivir and baricitinib - dosing with very slow tapering dose of steroids -continue to wean oxygen support as able -with ongoing clinical improvement patient was able to come off isolation 10/9 - focus now needs to be on titrating down O2 support w/ goal sats of 88% or > (or even less if pt not in distress) - cont w/ intermittent diuretic dosing to attempt to improve oxygenation   Elevated  D-dimer Given clinical scenario patient was being dosed with full dose Lovenox - CTA x2 and venous duplex lower extremities x2 negative for thrombus - transitioned to prophylactic dose Lovenox with falling D-dimer  Indeterminate QuantiFERON Obtained while in ICU -no evidence on CTA chest of active tuberculosis  GERD Continue PPI  Mild tachycardia TSH normal - CTa chest without acute findings  Code Status: FULL CODE Family Communication: No family present at time of exam Status is: Inpatient  Remains inpatient appropriate because:Inpatient level of care appropriate due to severity of illness   Dispo: The patient is from: Home              Anticipated d/c is to: unclear              Anticipated d/c date is: 3 days              Patient currently is not medically stable to d/c.   Consultants:  PCCM  Objective: Blood pressure 99/75, pulse 80, temperature 97.6 F (36.4 C), temperature source Oral, resp. rate 18, height 5\' 4"  (1.626 m), weight 108.5 kg, SpO2 97 %.  Intake/Output Summary (Last 24 hours) at 09/22/2020 1005 Last data filed at 09/21/2020 2100 Gross per 24 hour  Intake --  Output 300 ml  Net -300 ml   Filed Weights   09/03/20 1103 09/04/20 1500 09/07/20 0500  Weight: 112.2 kg 106.9 kg 108.5 kg    Examination: General: No acute distress  Lungs: Fine diffuse crackles - improved air movement th/o - no wheezing  Cardiovascular: RRR - no M or rub  Abdomen: NT/ND, soft,  BS positive, no rebound Extremities: No C/C/E B lower extremities   CBC: Recent Labs  Lab 09/16/20 0630 09/17/20 0334 09/20/20 0500  WBC 19.7* 15.8* 14.9*  NEUTROABS 17.0* 13.6*  --   HGB 9.8* 9.6* 10.5*  HCT 32.7* 32.4* 35.5*  MCV 89.6 89.0 90.6  PLT 267 258 443   Basic Metabolic Panel: Recent Labs  Lab 09/16/20 0630 09/17/20 0334 09/20/20 0500  NA 138 138 136  K 4.3 3.9 4.4  CL 97* 98 96*  CO2 29 31 31   GLUCOSE 78 92 98  BUN 13 12 17   CREATININE 0.65 0.77 0.69  CALCIUM 8.8*  9.0 9.0  MG 2.1 2.4 2.3   GFR: Estimated Creatinine Clearance: 87.7 mL/min (by C-G formula based on SCr of 0.69 mg/dL).  Liver Function Tests: Recent Labs  Lab 09/16/20 0630 09/17/20 0334 09/20/20 0500  AST 70* 74* 60*  ALT 226* 211* 211*  ALKPHOS 69 66 54  BILITOT 0.6 0.2* 0.5  PROT 6.5 6.4* 6.7  ALBUMIN 2.5* 2.4* 2.7*    HbA1C: Hgb A1c MFr Bld  Date/Time Value Ref Range Status  08/31/2020 05:18 AM 5.7 (H) 4.8 - 5.6 % Final    Comment:    (NOTE) Pre diabetes:          5.7%-6.4%  Diabetes:              >6.4%  Glycemic control for   <7.0% adults with diabetes     CBG: Recent Labs  Lab 09/21/20 0745 09/21/20 1229 09/21/20 1633 09/21/20 2056 09/22/20 0745  GLUCAP 90 186* 212* 177* 95    No results found for this or any previous visit (from the past 240 hour(s)).   Scheduled Meds: . (feeding supplement) PROSource Plus  30 mL Oral BID BM  . Chlorhexidine Gluconate Cloth  6 each Topical Daily  . enoxaparin (LOVENOX) injection  55 mg Subcutaneous Q24H  . feeding supplement (ENSURE ENLIVE)  237 mL Oral TID  . insulin aspart  0-9 Units Subcutaneous TID WC  . mouth rinse  15 mL Mouth Rinse BID  . metoprolol tartrate  12.5 mg Oral BID  . pantoprazole  40 mg Oral Q1200  . polyethylene glycol  17 g Oral BID  . predniSONE  20 mg Oral Q breakfast  . sodium chloride flush  10-40 mL Intracatheter Q12H      LOS: 23 days   Cherene Altes, MD [facility] Office  [phone] Pager - Text Page per Amion  If 7PM-7AM, please contact night-coverage per Amion 09/22/2020, 10:05 AM

## 2020-09-22 NOTE — Progress Notes (Addendum)
OT Cancellation Note  Patient Details Name: Wendy Ruiz MRN: [id number] DOB: [birth year]   Cancelled Treatment:    Reason Eval/Treat Not Completed: Other (comment) (Pt declined therapy at this time stating she is on lasix and doesn't want to have to go to the bathroom while exercising. Declined ADLs at this time as well. Will return as schedule allows. Thank you.)  [place], OTR/L Acute Rehab Pager: [phone] Office: [phone] 09/22/2020, 5:03 PM

## 2020-09-22 NOTE — Plan of Care (Signed)
Patient is currently resting in bed. Denies pain. OOB to BSC. VSS. Remains on NRB 15L. No issues at this time. Call bell within reach. Waiting on transfer to Med surg unit.   Problem: Education: Goal: Knowledge of risk factors and measures for prevention of condition will improve Outcome: Progressing   Problem: Respiratory: Goal: Will maintain a patent airway Outcome: Progressing   Problem: Education: Goal: Knowledge of General Education information will improve Description: Including pain rating scale, medication(s)/side effects and non-pharmacologic comfort measures Outcome: Progressing   Problem: Health Behavior/Discharge Planning: Goal: Ability to manage health-related needs will improve Outcome: Progressing   Problem: Clinical Measurements: Goal: Respiratory complications will improve Outcome: Progressing   Problem: Activity: Goal: Risk for activity intolerance will decrease Outcome: Progressing   Problem: Nutrition: Goal: Adequate nutrition will be maintained Outcome: Progressing   Problem: Clinical Measurements: Goal: Ability to maintain clinical measurements within normal limits will improve Outcome: Progressing Goal: Will remain free from infection Outcome: Progressing Goal: Diagnostic test results will improve Outcome: Progressing Goal: Cardiovascular complication will be avoided Outcome: Progressing   Problem: Coping: Goal: Level of anxiety will decrease Outcome: Progressing   Problem: Elimination: Goal: Will not experience complications related to bowel motility Outcome: Progressing Goal: Will not experience complications related to urinary retention Outcome: Progressing   Problem: Pain Managment: Goal: General experience of comfort will improve Outcome: Progressing   Problem: Safety: Goal: Ability to remain free from injury will improve Outcome: Progressing   Problem: Skin Integrity: Goal: Risk for impaired skin integrity will  decrease Outcome: Progressing

## 2020-09-22 NOTE — TOC Progression Note (Signed)
Transition of Care [facility]) - Progression Note    Patient Details  Name: Wendy Ruiz MRN: [id number] Date of Birth: [birth year]  Transition of Care [facility] CM/SW Contact  Sherrilyn Rist Transition of Care Supervisor Phone Number: [phone] 09/22/2020, 10:46 AM  Clinical Narrative:    Patient remains on 15L NRM at this time. Making slow progress; desats easily; TOC will continue to follow for progression of care; HHC vs CIR - depending on progress.   Expected Discharge Plan: [place] Barriers to Discharge: Continued Medical Work up  Expected Discharge Plan and Services Expected Discharge Plan: [place] In-house Referral: Development worker, community, Clinical Social Work Discharge Planning Services: CM Consult Post Acute Care Choice: NA                             HH Arranged: NA           Social Determinants of Health (SDOH) Interventions    Readmission Risk Interventions No flowsheet data found.

## 2020-09-22 NOTE — Progress Notes (Signed)
Pt transferred to 6N via wheelchair by [place], NT on 15L NRB. No s/s of distress noted. Report given prior to [place]. All belongings transferred with pt.

## 2020-09-23 ENCOUNTER — Inpatient Hospital Stay (HOSPITAL_COMMUNITY): Payer: HRSA Program

## 2020-09-23 LAB — GLUCOSE, CAPILLARY
Glucose-Capillary: 184 mg/dL — ABNORMAL HIGH (ref 70–99)
Glucose-Capillary: 168 mg/dL — ABNORMAL HIGH (ref 70–99)
Glucose-Capillary: 210 mg/dL — ABNORMAL HIGH (ref 70–99)
Glucose-Capillary: 159 mg/dL — ABNORMAL HIGH (ref 70–99)

## 2020-09-23 NOTE — Progress Notes (Signed)
Wendy Ruiz  SWN:[id number] DOB: Sep 23, 1956 DOA: 08/29/2020 PCP: Lyndee Hensen, DO    Brief Narrative:  64yo with a history of bipolar disorder, obesity, and GERD who was admitted to the hospital 9/18 with severe acute hypoxic respiratory failure and diagnosed with Covid pneumonia. The patient was not vaccinated against Covid. The patient was under the care of PCCM in the ICU and BiPAP dependent for a significant portion of the hospital stay. She was transferred to the hospital service 9/29. At that time she was requiring 50 L of heated high flow nasal cannula support with additional nonrebreather mask support.  Significant Events:  9/18 admit via [place]  9/19 CTa chest no PE -near confluent dense groundglass opacities all lobes bilaterally 9/21 TTE unrevealing 9/23 bilateral lower extremity venous duplex negative 9/29 [facility] assumed care from [facility]  10/2 bilateral lower extremity venous duplex negative 10/3 CTa chest without PE  Date of Positive COVID Test:  9/19  Date Isolation Ends: 10/9  Vaccination Status: Not vaccinated against Covid  COVID-19 specific Treatment: Baricitinib 9/19 > 10/2 Steroid 9/18 > Remdesivir 9/19 > 9/23  Antimicrobials:  Cefepime 9/23 > 9/27 Levaquin 9/29 > 10/2  DVT prophylaxis: Full dose Lovenox  Subjective: Patient states that her shortness of breath has improved over the last 1 week but continues to have cough.  She does get very short of breath with exertion.  Noted to be desaturate with ambulation.  Remains on nonrebreather.    Assessment & Plan:  Acute respiratory failure with hypoxia/pneumonia due to COVID-19  Has completed a course of remdesivir and baricitinib Remains on oral steroids. Patient continues to require high amounts of oxygen.  This appears to be primarily due to to the fact that she appears to be a mouth breather.  Tends to desaturate when she is on nasal cannula.  Currently saturations noted to be 97 to  98% on a nonrebreather.  Perhaps we could change her over to Ventimask and then eventually wean her down to nasal cannula. Chest x-ray from this morning does not show any significant changes compared to before.  Patient was given Lasix yesterday. She does have some pedal edema.  No labs checked today.  We will check a metabolic panel tomorrow and consider additional dose of Lasix.  We will also check her weight. Now she is off of isolation.  Elevated D-dimer Given clinical scenario patient was being dosed with full dose Lovenox - CTA x2 and venous duplex lower extremities x2 negative for thrombus - transitioned to prophylactic dose Lovenox with falling D-dimer  Indeterminate QuantiFERON Obtained while in ICU -no evidence on CTA chest of active tuberculosis  GERD Continue PPI  Mild tachycardia TSH normal.  Echocardiogram did not show any significant abnormalities except for elevated pulmonary artery systolic pressure.  Heart rate has improved.  DVT prophylaxis: Lovenox Code Status: FULL CODE Family Communication: No family present at time of exam Status is: Inpatient  Remains inpatient appropriate because:Inpatient level of care appropriate due to severity of illness   Dispo: The patient is from: Home              Anticipated d/c is to: unclear              Anticipated d/c date is: 3 days              Patient currently is not medically stable to d/c.   Consultants:  PCCM  Objective: Blood pressure 108/79, pulse 85, temperature 98.5 F (  36.9 C), temperature source Oral, resp. rate 16, height 5\' 4"  (1.626 m), weight 108.5 kg, SpO2 100 %.  Intake/Output Summary (Last 24 hours) at 09/23/2020 1155 Last data filed at 09/22/2020 1800 Gross per 24 hour  Intake 200 ml  Output --  Net 200 ml   Filed Weights   09/03/20 1103 09/04/20 1500 09/07/20 0500  Weight: 112.2 kg 106.9 kg 108.5 kg    Examination:  General appearance: Awake alert.  In no distress Resp: Normal effort at  rest.  Crackles bilateral bases.  No wheezing or rhonchi.   Cardio: S1-S2 is normal regular.  No S3-S4.  No rubs murmurs or bruit GI: Abdomen is soft.  Nontender nondistended.  Bowel sounds are present normal.  No masses organomegaly Extremities: No edema.  Full range of motion of lower extremities. Neurologic: Alert and oriented x3.  No focal neurological deficits.     CBC: Recent Labs  Lab 09/17/20 0334 09/20/20 0500  WBC 15.8* 14.9*  NEUTROABS 13.6*  --   HGB 9.6* 10.5*  HCT 32.4* 35.5*  MCV 89.0 90.6  PLT 258 569   Basic Metabolic Panel: Recent Labs  Lab 09/17/20 0334 09/20/20 0500  NA 138 136  K 3.9 4.4  CL 98 96*  CO2 31 31  GLUCOSE 92 98  BUN 12 17  CREATININE 0.77 0.69  CALCIUM 9.0 9.0  MG 2.4 2.3   GFR: Estimated Creatinine Clearance: 87.7 mL/min (by C-G formula based on SCr of 0.69 mg/dL).  Liver Function Tests: Recent Labs  Lab 09/17/20 0334 09/20/20 0500  AST 74* 60*  ALT 211* 211*  ALKPHOS 66 54  BILITOT 0.2* 0.5  PROT 6.4* 6.7  ALBUMIN 2.4* 2.7*    HbA1C: Hgb A1c MFr Bld  Date/Time Value Ref Range Status  08/31/2020 05:18 AM 5.7 (H) 4.8 - 5.6 % Final    Comment:    (NOTE) Pre diabetes:          5.7%-6.4%  Diabetes:              >6.4%  Glycemic control for   <7.0% adults with diabetes     CBG: Recent Labs  Lab 09/22/20 0745 09/22/20 1201 09/22/20 1632 09/22/20 1917 09/23/20 0750  GLUCAP 95 187* 212* 238* 184*    No results found for this or any previous visit (from the past 240 hour(s)).   Scheduled Meds: . (feeding supplement) PROSource Plus  30 mL Oral BID BM  . Chlorhexidine Gluconate Cloth  6 each Topical Daily  . enoxaparin (LOVENOX) injection  55 mg Subcutaneous Q24H  . feeding supplement (ENSURE ENLIVE)  237 mL Oral TID  . insulin aspart  0-9 Units Subcutaneous TID WC  . mouth rinse  15 mL Mouth Rinse BID  . metoprolol tartrate  12.5 mg Oral BID  . pantoprazole  40 mg Oral Q1200  . polyethylene glycol  17 g  Oral BID  . predniSONE  20 mg Oral Q breakfast  . sodium chloride flush  10-40 mL Intracatheter Q12H      LOS: 24 days   [facility] Office  [phone] Pager - Text Page per Shea Evans  If 7PM-7AM, please contact night-coverage per Amion 09/23/2020, 11:55 AM

## 2020-09-23 NOTE — Plan of Care (Signed)
  Problem: Education: Goal: Knowledge of risk factors and measures for prevention of condition will improve Outcome: Progressing   Problem: Respiratory: Goal: Will maintain a patent airway Outcome: Progressing   Problem: Education: Goal: Knowledge of General Education information will improve Description: Including pain rating scale, medication(s)/side effects and non-pharmacologic comfort measures Outcome: Progressing   Problem: Health Behavior/Discharge Planning: Goal: Ability to manage health-related needs will improve Outcome: Progressing   Problem: Clinical Measurements: Goal: Respiratory complications will improve Outcome: Progressing   Problem: Activity: Goal: Risk for activity intolerance will decrease Outcome: Progressing   Problem: Nutrition: Goal: Adequate nutrition will be maintained Outcome: Progressing   Problem: Clinical Measurements: Goal: Ability to maintain clinical measurements within normal limits will improve Outcome: Progressing Goal: Will remain free from infection Outcome: Progressing Goal: Diagnostic test results will improve Outcome: Progressing Goal: Cardiovascular complication will be avoided Outcome: Progressing   Problem: Coping: Goal: Level of anxiety will decrease Outcome: Progressing   Problem: Elimination: Goal: Will not experience complications related to bowel motility Outcome: Progressing Goal: Will not experience complications related to urinary retention Outcome: Progressing   Problem: Pain Managment: Goal: General experience of comfort will improve Outcome: Progressing   Problem: Safety: Goal: Ability to remain free from injury will improve Outcome: Progressing   Problem: Skin Integrity: Goal: Risk for impaired skin integrity will decrease Outcome: Progressing

## 2020-09-23 NOTE — Progress Notes (Signed)
Trail run on weaning patient off of non-rebreather mask, placed on Venti mask and patient did desaturate pretty fast down to 78 %, placed patient back on non-rebreather on 10 L, contacted respiratory therapist and stated she will be by the next 20 minutes to attempt switching patient to the venti mask.

## 2020-09-23 NOTE — Progress Notes (Signed)
Physical Therapy Treatment Patient Details Name: Wendy Ruiz MRN: [id number] DOB: 1956/12/03 Today's Date: 09/23/2020    History of Present Illness Pt is a 64 y.o. female admitted 08/29/20 not feeling well, apparently had syncopal episode and woke up to EMS present for hospital transport. CXR with bilateral infiltrates; pt hypoxic requiring NRB. PMH includes Bipolar 1, chronic pain, obesity.    PT Comments    Pt progressing towards her physical therapy goals. Session focused on seated therapeutic exercises and functional mobility. Pt ambulating 30 ft then 15 ft with no assistive device and min guard assist; required seated rest break in between bouts. SpO2 94% on 14L venti mask, desaturation to 78% with ambulation, but rebounded with seated rest break and pt self cueing for pursed lip breathing. Pt demonstrates improvement in recovery process with slowing respirations. Pt reports her daughter will be available to provide necessary assist; d/c plan remains appropriate.    Follow Up Recommendations  Home health PT;Supervision for mobility/OOB     Equipment Recommendations  Other (comment);3in1 (PT) (Rollator)    Recommendations for Other Services       Precautions / Restrictions Precautions Precautions: Fall;Other (comment) Precaution Comments: Venti mask Restrictions Weight Bearing Restrictions: No    Mobility  Bed Mobility               General bed mobility comments: In recliner upon arrival  Transfers Overall transfer level: Needs assistance Equipment used: None Transfers: Sit to/from Stand Sit to Stand: Supervision            Ambulation/Gait Ambulation/Gait assistance: Min guard Gait Distance (Feet): 45 Feet Assistive device: None Gait Pattern/deviations: Step-through pattern;Decreased stride length Gait velocity: decreased   General Gait Details: Pt ambulating 30 ft, then 15 ft with one seated rest break, min guard for stability.   Stairs              Wheelchair Mobility    Modified Rankin (Stroke Patients Only)       Balance Overall balance assessment: Needs assistance Sitting-balance support: No upper extremity supported;Feet supported Sitting balance-Leahy Scale: Good     Standing balance support: No upper extremity supported;During functional activity Standing balance-Leahy Scale: Fair                              Cognition Arousal/Alertness: Awake/alert Behavior During Therapy: WFL for tasks assessed/performed Overall Cognitive Status: Within Functional Limits for tasks assessed                                        Exercises General Exercises - Lower Extremity Long Arc Quad: Both;20 reps;Seated Hip Flexion/Marching: Both;20 reps;Seated Heel Raises: Both;20 reps;Seated    General Comments        Pertinent Vitals/Pain Pain Assessment: Faces Faces Pain Scale: No hurt    Home Living                      Prior Function            PT Goals (current goals can now be found in the care plan section) Acute Rehab PT Goals Patient Stated Goal: continue to get stronger PT Goal Formulation: With patient Time For Goal Achievement: 10/07/20 Potential to Achieve Goals: Good Progress towards PT goals: Progressing toward goals    Frequency    Min 3X/week  PT Plan Current plan remains appropriate    Co-evaluation              AM-PAC PT "6 Clicks" Mobility   Outcome Measure  Help needed turning from your back to your side while in a flat bed without using bedrails?: None Help needed moving from lying on your back to sitting on the side of a flat bed without using bedrails?: None Help needed moving to and from a bed to a chair (including a wheelchair)?: None Help needed standing up from a chair using your arms (e.g., wheelchair or bedside chair)?: None Help needed to walk in hospital room?: A Little Help needed climbing 3-5 steps with a railing?  : A Lot 6 Click Score: 21    End of Session   Activity Tolerance: Patient tolerated treatment well Patient left: in chair;with call bell/phone within reach Nurse Communication: Mobility status PT Visit Diagnosis: Other abnormalities of gait and mobility (R26.89)     Time: 3903-0092 PT Time Calculation (min) (ACUTE ONLY): 20 min  Charges:  $Therapeutic Activity: 8-22 mins                     Wyona Almas, PT, DPT Acute Rehabilitation Services Pager [phone] Office [phone]    Wendy Ruiz 09/23/2020, 5:20 PM

## 2020-09-24 DIAGNOSIS — J1282 Pneumonia due to coronavirus disease 2019: Secondary | ICD-10-CM | POA: Diagnosis not present

## 2020-09-24 DIAGNOSIS — J9601 Acute respiratory failure with hypoxia: Secondary | ICD-10-CM | POA: Diagnosis not present

## 2020-09-24 DIAGNOSIS — U071 COVID-19: Secondary | ICD-10-CM | POA: Diagnosis not present

## 2020-09-24 LAB — GLUCOSE, CAPILLARY
Glucose-Capillary: 107 mg/dL — ABNORMAL HIGH (ref 70–99)
Glucose-Capillary: 115 mg/dL — ABNORMAL HIGH (ref 70–99)
Glucose-Capillary: 163 mg/dL — ABNORMAL HIGH (ref 70–99)
Glucose-Capillary: 167 mg/dL — ABNORMAL HIGH (ref 70–99)
Glucose-Capillary: 190 mg/dL — ABNORMAL HIGH (ref 70–99)
Glucose-Capillary: 291 mg/dL — ABNORMAL HIGH (ref 70–99)

## 2020-09-24 LAB — COMPREHENSIVE METABOLIC PANEL
ALT: 135 U/L — ABNORMAL HIGH (ref 0–44)
AST: 51 U/L — ABNORMAL HIGH (ref 15–41)
Albumin: 2.8 g/dL — ABNORMAL LOW (ref 3.5–5.0)
Alkaline Phosphatase: 58 U/L (ref 38–126)
Anion gap: 12 (ref 5–15)
BUN: 9 mg/dL (ref 8–23)
CO2: 30 mmol/L (ref 22–32)
Calcium: 9.1 mg/dL (ref 8.9–10.3)
Chloride: 94 mmol/L — ABNORMAL LOW (ref 98–111)
Creatinine, Ser: 0.79 mg/dL (ref 0.44–1.00)
GFR, Estimated: >60 mL/min (ref >60)
Glucose, Bld: 173 mg/dL — ABNORMAL HIGH (ref 70–99)
Potassium: 3.7 mmol/L (ref 3.5–5.1)
Sodium: 136 mmol/L (ref 135–145)
Total Bilirubin: 0.6 mg/dL (ref 0.3–1.2)
Total Protein: 6.3 g/dL — ABNORMAL LOW (ref 6.5–8.1)

## 2020-09-24 LAB — CBC
HCT: 34.8 % — ABNORMAL LOW (ref 36.0–46.0)
Hemoglobin: 10.3 g/dL — ABNORMAL LOW (ref 12.0–15.0)
MCH: 26.4 pg (ref 26.0–34.0)
MCHC: 29.6 g/dL — ABNORMAL LOW (ref 30.0–36.0)
MCV: 89.2 fL (ref 80.0–100.0)
Platelets: 201 10*3/uL (ref 150–400)
RBC: 3.9 MIL/uL (ref 3.87–5.11)
RDW: 13.5 % (ref 11.5–15.5)
WBC: 13 10*3/uL — ABNORMAL HIGH (ref 4.0–10.5)
nRBC: 0 % (ref 0.0–0.2)

## 2020-09-24 MED ORDER — FUROSEMIDE 10 MG/ML IJ SOLN
40.0000 mg | Freq: Once | INTRAMUSCULAR | Status: DC
  Filled 2020-09-24: qty 4

## 2020-09-24 MED ORDER — FUROSEMIDE 80 MG PO TABS
80.0000 mg | ORAL_TABLET | Freq: Once | ORAL | Status: AC
  Administered 2020-09-24: 80 mg via ORAL
  Filled 2020-09-24: qty 1

## 2020-09-24 MED ORDER — POTASSIUM CHLORIDE CRYS ER 20 MEQ PO TBCR
40.0000 meq | EXTENDED_RELEASE_TABLET | Freq: Once | ORAL | Status: AC
  Administered 2020-09-24: 40 meq via ORAL
  Filled 2020-09-24: qty 2

## 2020-09-24 NOTE — Progress Notes (Signed)
Occupational Therapy Treatment Patient Details Name: Wendy Ruiz MRN: [id number] DOB: 01-16-1956 Today's Date: 09/24/2020    History of present illness Pt is a 64 y.o. female admitted 08/29/20 not feeling well, apparently had syncopal episode and woke up to EMS present for hospital transport. CXR with bilateral infiltrates; pt hypoxic requiring NRB. PMH includes Bipolar 1, chronic pain, obesity.   OT comments  Pt making gradual progress towards OT goals this session. Pt continues to present with dyspnea with exertion, decreased activity tolerance, and generalized weakness impacting pts ability to complete BADLs. Pt on 13L venturimask upon arrival( MD had just attempted to try to wean pt from 15L upon arrival). Pt motivated to complete functional mobility into bathroom to complete toileting. Increased pts O2 to 15L during functional mobility as pt desaturated to mid 70s during ambulation although difficult to obtain accurate reading during mobility d/t poor pleth. Pt required x2 seated rest breaks during toilet tasks d/t fatigue.Pt required ~ 4 mins for O2 to increase to >90% on 13L. Pt left up in recliner on 13L with O2 91%. Pt would continue to benefit from skilled occupational therapy while admitted and after d/c to address the below listed limitations in order to improve overall functional mobility and facilitate independence with BADL participation. DC plan remains appropriate, will follow acutely per POC.     Follow Up Recommendations  Home health OT;Supervision/Assistance - 24 hour;Other (comment) (pending progress)    Equipment Recommendations  3 in 1 bedside commode    Recommendations for Other Services      Precautions / Restrictions Precautions Precautions: Fall;Other (comment) Precaution Comments: Venti mask Restrictions Weight Bearing Restrictions: No       Mobility Bed Mobility               General bed mobility comments: OOB in  recliner  Transfers Overall transfer level: Needs assistance Equipment used: None Transfers: Sit to/from Stand Sit to Stand: Supervision;Min guard         General transfer comment: gross supervision to sit<>stand from recliner with good hand placement noted, MIN guard to rise from toilet with cues for hand placement on grab bars    Balance Overall balance assessment: Needs assistance Sitting-balance support: No upper extremity supported;Feet supported Sitting balance-Leahy Scale: Good     Standing balance support: No upper extremity supported;During functional activity Standing balance-Leahy Scale: Fair Standing balance comment: pt able to stand with no UE support to complete pericare with no LOB and close min guard                           ADL either performed or assessed with clinical judgement   ADL Overall ADL's : Needs assistance/impaired     Grooming: Wash/dry hands;Sitting;Set up Grooming Details (indicate cue type and reason): deferred washing hands in chair d/t dyspnea; set- up assist from recliner                 Toilet Transfer: Minimal assistance;Grab Haematologist;Ambulation Toilet Transfer Details (indicate cue type and reason): pt able to ambulate into BR with MIN A for balance with no AD, increased time and efforted with pt slighty off balance needing MIN A for balance Toileting- Clothing Manipulation and Hygiene: Supervision/safety;Sitting/lateral lean;Sit to/from stand Toileting - Clothing Manipulation Details (indicate cue type and reason): pt able to complete anterior and posterior pericare via lateral leans and sit<>stand with increased time and rest breaks in between trials d/t dyspnea  Tub/Shower Transfer Details (indicate cue type and reason): pt reports tub shower, will likely need a TTB Functional mobility during ADLs: Minimal assistance General ADL Comments: pt continues to be present with decreased activity tolerance,  dyspnea with exertion and generalized weakness impacting pts ability to complete BADLs. Pt on 13L venturimask upon OTA entry with SpO2 sats 93% at rest, increased O2 to 15L during ambulation with pt desaturating to mid 70s. difficulty to get accurate reading d/t poor pleth. education provided on ECS related to ADLs with pt verbalizing understanding     Vision       Perception     Praxis      Cognition Arousal/Alertness: Awake/alert Behavior During Therapy: WFL for tasks assessed/performed;Flat affect Overall Cognitive Status: Within Functional Limits for tasks assessed                                          Exercises Other Exercises Other Exercises: encouraged use of IS x10 every hour with pt verbalizing understanding   Shoulder Instructions       General Comments pt on 13L on venturimask upon OTA entry, increased O2 to 15L during mobility with pt desaturating to mid 70s with ambulation. Pt required x2 seated rest breaks during toileting tasks with HR increasing to 132 bpm with mobility    Pertinent Vitals/ Pain       Pain Assessment: No/denies pain  Home Living                                          Prior Functioning/Environment              Frequency  Min 3X/week        Progress Toward Goals  OT Goals(current goals can now be found in the care plan section)  Progress towards OT goals: Progressing toward goals  Acute Rehab OT Goals Patient Stated Goal: continue to get stronger OT Goal Formulation: With patient Time For Goal Achievement: 09/24/20 Potential to Achieve Goals: Good  Plan Discharge plan remains appropriate;Frequency remains appropriate    Co-evaluation                 AM-PAC OT "6 Clicks" Daily Activity     Outcome Measure   Help from another person eating meals?: None Help from another person taking care of personal grooming?: A Little Help from another person toileting, which includes using  toliet, bedpan, or urinal?: A Little Help from another person bathing (including washing, rinsing, drying)?: A Lot Help from another person to put on and taking off regular upper body clothing?: A Little Help from another person to put on and taking off regular lower body clothing?: A Little 6 Click Score: 18    End of Session Equipment Utilized During Treatment: Oxygen;Other (comment) (13-15L venturimask)  OT Visit Diagnosis: Unsteadiness on feet (R26.81);Other abnormalities of gait and mobility (R26.89);Muscle weakness (generalized) (M62.81);Pain   Activity Tolerance Patient tolerated treatment well;Other (comment) (limited by dyspnea with exertion)   Patient Left in chair;with call bell/phone within reach   Nurse Communication Mobility status        Time: 0350-0938 OT Time Calculation (min): 26 min  Charges: OT General Charges $OT Visit: 1 Visit OT Treatments $Self Care/Home Management : 23-37 mins  Corinne Ports C., COTA/L Acute  Rehabilitation Services [phone] [place] 09/24/2020, 9:22 AM

## 2020-09-24 NOTE — Progress Notes (Signed)
RN Aldona Bar made aware that she can DC the midline through secure chat

## 2020-09-24 NOTE — Progress Notes (Signed)
Arrived to patient's room to restart PIV. Patient refused. Patient's nurse notified. VU. Fran Lowes, RN VAST

## 2020-09-24 NOTE — Plan of Care (Signed)
  Problem: Education: Goal: Knowledge of risk factors and measures for prevention of condition will improve Outcome: Progressing   

## 2020-09-24 NOTE — Progress Notes (Addendum)
Wendy Ruiz  OBS:[id number] DOB: [birth year] DOA: 08/29/2020 PCP: Lyndee Hensen, DO    Brief Narrative:  64yo with a history of bipolar disorder, obesity, and GERD who was admitted to the hospital 9/18 with severe acute hypoxic respiratory failure and diagnosed with Covid pneumonia. The patient was not vaccinated against Covid. The patient was under the care of PCCM in the ICU and BiPAP dependent for a significant portion of the hospital stay. She was transferred to the hospital service 9/29. At that time she was requiring 50 L of heated high flow nasal cannula support with additional nonrebreather mask support.  Significant Events:  9/18 admit via [place]  9/19 CTa chest no PE -near confluent dense groundglass opacities all lobes bilaterally 9/21 TTE unrevealing 9/23 bilateral lower extremity venous duplex negative 9/29 [facility] assumed care from [facility] [place]  10/2 bilateral lower extremity venous duplex negative 10/3 CTa chest without PE  Date of Positive COVID Test:  9/19  Date Isolation Ends: 10/9  Vaccination Status: Not vaccinated against Covid  COVID-19 specific Treatment: Baricitinib 9/19 > 10/2 Steroid 9/18 > Remdesivir 9/19 > 9/23  Antimicrobials:  Cefepime 9/23 > 9/27 Levaquin 9/29 > 10/2  DVT prophylaxis: Full dose Lovenox  Subjective: Patient mentions that she feels about the same as yesterday.  No significant improvement in her shortness of breath.  No chest pain.  No new symptoms.    Assessment & Plan:  Acute respiratory failure with hypoxia/pneumonia due to COVID-19  Has completed a course of remdesivir and baricitinib Remains on oral steroids. Patient remains on Ventimask at 14 L/min.  Continue to wean down as tolerated.  Saturating in the mid 90s.   Chest x-ray from yesterday did not show any significant change as compared to before.  She was given furosemide the day before yesterday.  She does have some pedal edema.  Labs from this morning are  stable.  Give additional dose of furosemide today. She is off of isolation. Check weights.  Elevated D-dimer Given clinical scenario patient was being dosed with full dose Lovenox - CTA x2 and venous duplex lower extremities x2 negative for thrombus - transitioned to prophylactic dose Lovenox with falling D-dimer  Normocytic anemia Hemoglobin is stable.  No evidence of overt blood loss.  Transaminitis Likely secondary to COVID-19. It is gradually improving.  Continue to monitor.    Indeterminate QuantiFERON Obtained while in ICU -no evidence on CTA chest of active tuberculosis  GERD Continue PPI  Mild tachycardia TSH normal.  Echocardiogram did not show any significant abnormalities except for elevated pulmonary artery systolic pressure.  Heart rate has improved.  DVT prophylaxis: Lovenox Code Status: FULL CODE Family Communication: No family present at time of exam Status is: Inpatient  Remains inpatient appropriate because:Inpatient level of care appropriate due to severity of illness   Dispo: The patient is from: Home              Anticipated d/c is to: unclear              Anticipated d/c date is: 3 days              Patient currently is not medically stable to d/c.   Consultants:  PCCM  Objective: Blood pressure 124/73, pulse 80, temperature 97.9 F (36.6 C), temperature source Oral, resp. rate 16, height 5\' 4"  (1.626 m), weight 108.5 kg, SpO2 97 %.  Intake/Output Summary (Last 24 hours) at 09/24/2020 1108 Last data filed at 09/24/2020 0900 Gross per  24 hour  Intake 1020 ml  Output --  Net 1020 ml   Filed Weights   09/03/20 1103 09/04/20 1500 09/07/20 0500  Weight: 112.2 kg 106.9 kg 108.5 kg    Examination:  General appearance: Awake alert.  In no distress Resp: Crackles bilateral bases.  Mildly tachypneic at rest.  No wheezing or rhonchi. Cardio: S1-S2 is normal regular.  No S3-S4.  No rubs murmurs or bruit GI: Abdomen is soft.  Nontender  nondistended.  Bowel sounds are present normal.  No masses organomegaly Extremities: Pedal edema bilateral lower extremities Neurologic: Alert and oriented x3.  No focal neurological deficits.       CBC: Recent Labs  Lab 09/20/20 0500 09/24/20 0906  WBC 14.9* 13.0*  HGB 10.5* 10.3*  HCT 35.5* 34.8*  MCV 90.6 89.2  PLT 201 381   Basic Metabolic Panel: Recent Labs  Lab 09/20/20 0500 09/24/20 0906  NA 136 136  K 4.4 3.7  CL 96* 94*  CO2 31 30  GLUCOSE 98 173*  BUN 17 9  CREATININE 0.69 0.79  CALCIUM 9.0 9.1  MG 2.3  --    GFR: Estimated Creatinine Clearance: 87.7 mL/min (by C-G formula based on SCr of 0.79 mg/dL).  Liver Function Tests: Recent Labs  Lab 09/20/20 0500 09/24/20 0906  AST 60* 51*  ALT 211* 135*  ALKPHOS 54 58  BILITOT 0.5 0.6  PROT 6.7 6.3*  ALBUMIN 2.7* 2.8*    HbA1C: Hgb A1c MFr Bld  Date/Time Value Ref Range Status  08/31/2020 05:18 AM 5.7 (H) 4.8 - 5.6 % Final    Comment:    (NOTE) Pre diabetes:          5.7%-6.4%  Diabetes:              >6.4%  Glycemic control for   <7.0% adults with diabetes     CBG: Recent Labs  Lab 09/23/20 1640 09/23/20 2040 09/24/20 0025 09/24/20 0446 09/24/20 0758  GLUCAP 210* 159* 115* 107* 163*    No results found for this or any previous visit (from the past 240 hour(s)).   Scheduled Meds: . (feeding supplement) PROSource Plus  30 mL Oral BID BM  . Chlorhexidine Gluconate Cloth  6 each Topical Daily  . enoxaparin (LOVENOX) injection  55 mg Subcutaneous Q24H  . feeding supplement  237 mL Oral TID  . insulin aspart  0-9 Units Subcutaneous TID WC  . mouth rinse  15 mL Mouth Rinse BID  . metoprolol tartrate  12.5 mg Oral BID  . pantoprazole  40 mg Oral Q1200  . polyethylene glycol  17 g Oral BID  . predniSONE  20 mg Oral Q breakfast      LOS: 25 days   [facility] Office  [phone] Pager - Text Page per Shea Evans  If 7PM-7AM, please contact night-coverage per  Amion 09/24/2020, 11:08 AM

## 2020-09-25 DIAGNOSIS — U071 COVID-19: Secondary | ICD-10-CM | POA: Diagnosis not present

## 2020-09-25 DIAGNOSIS — J1282 Pneumonia due to coronavirus disease 2019: Secondary | ICD-10-CM | POA: Diagnosis not present

## 2020-09-25 DIAGNOSIS — J9601 Acute respiratory failure with hypoxia: Secondary | ICD-10-CM | POA: Diagnosis not present

## 2020-09-25 LAB — COMPREHENSIVE METABOLIC PANEL
ALT: 132 U/L — ABNORMAL HIGH (ref 0–44)
AST: 45 U/L — ABNORMAL HIGH (ref 15–41)
Albumin: 2.9 g/dL — ABNORMAL LOW (ref 3.5–5.0)
Alkaline Phosphatase: 54 U/L (ref 38–126)
Anion gap: 11 (ref 5–15)
BUN: 10 mg/dL (ref 8–23)
CO2: 33 mmol/L — ABNORMAL HIGH (ref 22–32)
Calcium: 9.1 mg/dL (ref 8.9–10.3)
Chloride: 92 mmol/L — ABNORMAL LOW (ref 98–111)
Creatinine, Ser: 0.63 mg/dL (ref 0.44–1.00)
GFR, Estimated: >60 mL/min (ref >60)
Glucose, Bld: 127 mg/dL — ABNORMAL HIGH (ref 70–99)
Potassium: 4 mmol/L (ref 3.5–5.1)
Sodium: 136 mmol/L (ref 135–145)
Total Bilirubin: 0.6 mg/dL (ref 0.3–1.2)
Total Protein: 6.4 g/dL — ABNORMAL LOW (ref 6.5–8.1)

## 2020-09-25 LAB — GLUCOSE, CAPILLARY
Glucose-Capillary: 168 mg/dL — ABNORMAL HIGH (ref 70–99)
Glucose-Capillary: 180 mg/dL — ABNORMAL HIGH (ref 70–99)
Glucose-Capillary: 222 mg/dL — ABNORMAL HIGH (ref 70–99)
Glucose-Capillary: 158 mg/dL — ABNORMAL HIGH (ref 70–99)

## 2020-09-25 MED ORDER — IPRATROPIUM BROMIDE HFA 17 MCG/ACT IN AERS
2.0000 | INHALATION_SPRAY | Freq: Four times a day (QID) | RESPIRATORY_TRACT | Status: DC
  Administered 2020-09-25 – 2020-09-29 (×12): 2 via RESPIRATORY_TRACT
  Filled 2020-09-25 (×3): qty 12.9

## 2020-09-25 MED ORDER — MOMETASONE FURO-FORMOTEROL FUM 200-5 MCG/ACT IN AERO
2.0000 | INHALATION_SPRAY | Freq: Two times a day (BID) | RESPIRATORY_TRACT | Status: DC
  Administered 2020-09-25 – 2020-09-29 (×8): 2 via RESPIRATORY_TRACT
  Filled 2020-09-25 (×3): qty 8.8

## 2020-09-25 MED ORDER — BENZONATATE 100 MG PO CAPS
200.0000 mg | ORAL_CAPSULE | Freq: Once | ORAL | Status: AC
  Administered 2020-09-25: 200 mg via ORAL
  Filled 2020-09-25: qty 2

## 2020-09-25 MED ORDER — FUROSEMIDE 80 MG PO TABS
80.0000 mg | ORAL_TABLET | Freq: Every day | ORAL | Status: AC
  Administered 2020-09-25 – 2020-09-27 (×3): 80 mg via ORAL
  Filled 2020-09-25 (×3): qty 1

## 2020-09-25 MED ORDER — IPRATROPIUM-ALBUTEROL 20-100 MCG/ACT IN AERS: 2.0000 | INHALATION_SPRAY | Freq: Four times a day (QID) | RESPIRATORY_TRACT | Status: DC

## 2020-09-25 NOTE — Progress Notes (Signed)
Physical Therapy Treatment Patient Details Name: Wendy Ruiz MRN: [id number] DOB: [birth year] Today's Date: 09/25/2020    History of Present Illness Pt is a 64 y.o. female admitted 08/29/20 not feeling well, apparently had syncopal episode and woke up to EMS present for hospital transport. CXR with bilateral infiltrates; pt hypoxic requiring NRB. PMH includes Bipolar 1, chronic pain, obesity.    PT Comments    Pt progressing well towards her physical therapy goals. Received on 10L via venti mask, 96% SpO2. Initiated with seated warm up exercise and focused on progressive ambulation. Pt ambulating 30 ft, then 20 ft, then 15 ft with 2-3 minute seated rest breaks. Initial desaturation to 69% with ambulation attempt, so bumped up to 13L where she maintained > 84% during bouts. At end of session, returned to 10L, pt satting 92%. D/c plan remains appropriate.     Follow Up Recommendations  Home health PT;Supervision for mobility/OOB     Equipment Recommendations  3in1 (PT);Other (comment) (Rollator)    Recommendations for Other Services       Precautions / Restrictions Precautions Precautions: Fall;Other (comment) Precaution Comments: Venti mask Restrictions Weight Bearing Restrictions: No    Mobility  Bed Mobility               General bed mobility comments: OOB in recliner  Transfers Overall transfer level: Needs assistance Equipment used: None Transfers: Sit to/from Stand Sit to Stand: Supervision            Ambulation/Gait Ambulation/Gait assistance: Min guard Gait Distance (Feet): 65 Feet (30", 20", 15") Assistive device: None Gait Pattern/deviations: Step-through pattern;Decreased stride length Gait velocity: decreased   General Gait Details: Pt ambulating 30 ft, then 20 ft, then 15 ft with 2-3 minute seated rest breaks. Increased lateral sway but no overt LOB   Stairs             Wheelchair Mobility    Modified Rankin (Stroke Patients  Only)       Balance Overall balance assessment: Needs assistance Sitting-balance support: No upper extremity supported;Feet supported Sitting balance-Leahy Scale: Good     Standing balance support: No upper extremity supported;During functional activity Standing balance-Leahy Scale: Fair                              Cognition Arousal/Alertness: Awake/alert Behavior During Therapy: WFL for tasks assessed/performed;Flat affect Overall Cognitive Status: Within Functional Limits for tasks assessed                                        Exercises General Exercises - Upper Extremity Shoulder Flexion: Both;10 reps;Seated Elbow Flexion: Both;10 reps;Seated General Exercises - Lower Extremity Long Arc Quad: Both;10 reps;Seated Hip Flexion/Marching: Both;10 reps;Seated Other Exercises Other Exercises: x3 Sit to Stands    General Comments        Pertinent Vitals/Pain Pain Assessment: Faces Faces Pain Scale: No hurt    Home Living                      Prior Function            PT Goals (current goals can now be found in the care plan section) Acute Rehab PT Goals Patient Stated Goal: continue to wean oxygen Potential to Achieve Goals: Good Progress towards PT goals: Progressing toward goals    Frequency  Min 3X/week      PT Plan Current plan remains appropriate    Co-evaluation              AM-PAC PT "6 Clicks" Mobility   Outcome Measure  Help needed turning from your back to your side while in a flat bed without using bedrails?: None Help needed moving from lying on your back to sitting on the side of a flat bed without using bedrails?: None Help needed moving to and from a bed to a chair (including a wheelchair)?: None Help needed standing up from a chair using your arms (e.g., wheelchair or bedside chair)?: None Help needed to walk in hospital room?: A Little Help needed climbing 3-5 steps with a railing? : A  Lot 6 Click Score: 21    End of Session Equipment Utilized During Treatment: Oxygen Activity Tolerance: Patient tolerated treatment well Patient left: in chair;with call bell/phone within reach Nurse Communication: Mobility status PT Visit Diagnosis: Other abnormalities of gait and mobility (R26.89)     Time: 9379-0240 PT Time Calculation (min) (ACUTE ONLY): 27 min  Charges:  $Therapeutic Exercise: 8-22 mins $Therapeutic Activity: 8-22 mins                     Wendy Ruiz, PT, DPT Acute Rehabilitation Services Pager [phone] Office [phone]    Wendy Ruiz 09/25/2020, 4:14 PM

## 2020-09-25 NOTE — Progress Notes (Signed)
Wendy Ruiz  ZHY:[id number] DOB: 12-Dec-1956 DOA: 08/29/2020 PCP: Lyndee Hensen, DO    Brief Narrative:  64yo with a history of bipolar disorder, obesity, and GERD who was admitted to the hospital 9/18 with severe acute hypoxic respiratory failure and diagnosed with Covid pneumonia. The patient was not vaccinated against Covid. The patient was under the care of PCCM in the ICU and BiPAP dependent for a significant portion of the hospital stay. She was transferred to the hospital service 9/29. At that time she was requiring 50 L of heated high flow nasal cannula support with additional nonrebreather mask support.  Significant Events:  9/18 admit via [place]  9/19 CTa chest no PE -near confluent dense groundglass opacities all lobes bilaterally 9/21 TTE unrevealing 9/23 bilateral lower extremity venous duplex negative 9/29 TRH assumed care from [facility]  10/2 bilateral lower extremity venous duplex negative 10/3 CTa chest without PE  Date of Positive COVID Test:  9/19  Date Isolation Ends: 10/9  Vaccination Status: Not vaccinated against Covid  COVID-19 specific Treatment: Baricitinib 9/19 > 10/2 Steroid 9/18 > Remdesivir 9/19 > 9/23  Antimicrobials:  Cefepime 9/23 > 9/27 Levaquin 9/29 > 10/2  DVT prophylaxis: Full dose Lovenox  Subjective: Patient with no new complaints.  States that she is feeling slightly better today.  She is now ambulated to the bathroom and back.  Saturations noted to be in the late 80s.    Assessment & Plan:  Acute respiratory failure with hypoxia/pneumonia due to COVID-19  Has completed a course of remdesivir and baricitinib Remains on oral steroids. Patient remains on Ventimask at 14 L/min.  Saturating in the late 80s. Chest x-ray from 10/13 did not show any significant change as compared to before.   Patient has declined IV access.  Midline had to be removed yesterday as it was not functioning.  Will continue with oral  furosemide for now.  Continue to monitor weight.  Repeat chest x-ray tomorrow.   She might benefit from inhaled steroids.  This will be ordered.  Elevated D-dimer Given clinical scenario patient was being dosed with full dose Lovenox - CTA x2 and venous duplex lower extremities x2 negative for thrombus - transitioned to prophylactic dose Lovenox with falling D-dimer  Normocytic anemia Hemoglobin has been stable.  No evidence of overt blood loss.    Transaminitis Likely secondary to COVID-19. It is gradually improving.  Continue to monitor.    Indeterminate QuantiFERON Obtained while in ICU -no evidence on CTA chest of active tuberculosis  GERD Continue PPI  Mild tachycardia TSH normal.  Echocardiogram did not show any significant abnormalities except for elevated pulmonary artery systolic pressure.  Heart rate has improved.  DVT prophylaxis: Lovenox Code Status: FULL CODE Family Communication: No family present at time of exam Status is: Inpatient  Remains inpatient appropriate because:Inpatient level of care appropriate due to severity of illness   Dispo: The patient is from: Home              Anticipated d/c is to: unclear              Anticipated d/c date is: 3 days              Patient currently is not medically stable to d/c.   Consultants:  PCCM  Objective: Blood pressure 115/74, pulse 82, temperature 98.7 F (37.1 C), temperature source Oral, resp. rate 18, height 5\' 4"  (1.626 m), weight 103.3 kg, SpO2 96 %.  Intake/Output Summary (Last  24 hours) at 09/25/2020 1145 Last data filed at 09/24/2020 1700 Gross per 24 hour  Intake 360 ml  Output 0 ml  Net 360 ml   Filed Weights   09/04/20 1500 09/07/20 0500 09/25/20 0500  Weight: 106.9 kg 108.5 kg 103.3 kg    Examination:  General appearance: Awake alert.  In no distress Resp: Mildly tachypneic at rest.  Few crackles bilateral bases.  Coarse breath sounds.  No wheezing or rhonchi. Cardio: S1-S2 is normal  regular.  No S3-S4.  No rubs murmurs or bruit GI: Abdomen is soft.  Nontender nondistended.  Bowel sounds are present normal.  No masses organomegaly Extremities: Improved edema bilateral lower extremities Neurologic: Alert and oriented x3.  No focal neurological deficits.       CBC: Recent Labs  Lab 09/20/20 0500 09/24/20 0906  WBC 14.9* 13.0*  HGB 10.5* 10.3*  HCT 35.5* 34.8*  MCV 90.6 89.2  PLT 201 789   Basic Metabolic Panel: Recent Labs  Lab 09/20/20 0500 09/24/20 0906 09/25/20 0144  NA 136 136 136  K 4.4 3.7 4.0  CL 96* 94* 92*  CO2 31 30 33*  GLUCOSE 98 173* 127*  BUN 17 9 10   CREATININE 0.69 0.79 0.63  CALCIUM 9.0 9.1 9.1  MG 2.3  --   --    GFR: Estimated Creatinine Clearance: 85.3 mL/min (by C-G formula based on SCr of 0.63 mg/dL).  Liver Function Tests: Recent Labs  Lab 09/20/20 0500 09/24/20 0906 09/25/20 0144  AST 60* 51* 45*  ALT 211* 135* 132*  ALKPHOS 54 58 54  BILITOT 0.5 0.6 0.6  PROT 6.7 6.3* 6.4*  ALBUMIN 2.7* 2.8* 2.9*    HbA1C: Hgb A1c MFr Bld  Date/Time Value Ref Range Status  08/31/2020 05:18 AM 5.7 (H) 4.8 - 5.6 % Final    Comment:    (NOTE) Pre diabetes:          5.7%-6.4%  Diabetes:              >6.4%  Glycemic control for   <7.0% adults with diabetes     CBG: Recent Labs  Lab 09/24/20 0758 09/24/20 1140 09/24/20 1632 09/24/20 2127 09/25/20 0814  GLUCAP 163* 190* 291* 167* 168*    No results found for this or any previous visit (from the past 240 hour(s)).   Scheduled Meds: . (feeding supplement) PROSource Plus  30 mL Oral BID BM  . Chlorhexidine Gluconate Cloth  6 each Topical Daily  . enoxaparin (LOVENOX) injection  55 mg Subcutaneous Q24H  . feeding supplement  237 mL Oral TID  . insulin aspart  0-9 Units Subcutaneous TID WC  . mouth rinse  15 mL Mouth Rinse BID  . metoprolol tartrate  12.5 mg Oral BID  . pantoprazole  40 mg Oral Q1200  . polyethylene glycol  17 g Oral BID  . predniSONE  20 mg  Oral Q breakfast      LOS: 26 days   [facility] Office  [phone] Pager - Text Page per Shea Evans  If 7PM-7AM, please contact night-coverage per Amion 09/25/2020, 11:45 AM

## 2020-09-26 ENCOUNTER — Inpatient Hospital Stay (HOSPITAL_COMMUNITY): Payer: HRSA Program

## 2020-09-26 DIAGNOSIS — J9601 Acute respiratory failure with hypoxia: Secondary | ICD-10-CM | POA: Diagnosis not present

## 2020-09-26 DIAGNOSIS — U071 COVID-19: Secondary | ICD-10-CM | POA: Diagnosis not present

## 2020-09-26 DIAGNOSIS — J1282 Pneumonia due to coronavirus disease 2019: Secondary | ICD-10-CM | POA: Diagnosis not present

## 2020-09-26 LAB — COMPREHENSIVE METABOLIC PANEL
ALT: 130 U/L — ABNORMAL HIGH (ref 0–44)
AST: 44 U/L — ABNORMAL HIGH (ref 15–41)
Albumin: 3 g/dL — ABNORMAL LOW (ref 3.5–5.0)
Alkaline Phosphatase: 60 U/L (ref 38–126)
Anion gap: 12 (ref 5–15)
BUN: 11 mg/dL (ref 8–23)
CO2: 32 mmol/L (ref 22–32)
Calcium: 9.4 mg/dL (ref 8.9–10.3)
Chloride: 92 mmol/L — ABNORMAL LOW (ref 98–111)
Creatinine, Ser: 0.71 mg/dL (ref 0.44–1.00)
GFR, Estimated: >60 mL/min (ref >60)
Glucose, Bld: 127 mg/dL — ABNORMAL HIGH (ref 70–99)
Potassium: 3.5 mmol/L (ref 3.5–5.1)
Sodium: 136 mmol/L (ref 135–145)
Total Bilirubin: 0.8 mg/dL (ref 0.3–1.2)
Total Protein: 6.7 g/dL (ref 6.5–8.1)

## 2020-09-26 LAB — GLUCOSE, CAPILLARY
Glucose-Capillary: 181 mg/dL — ABNORMAL HIGH (ref 70–99)
Glucose-Capillary: 287 mg/dL — ABNORMAL HIGH (ref 70–99)
Glucose-Capillary: 247 mg/dL — ABNORMAL HIGH (ref 70–99)
Glucose-Capillary: 241 mg/dL — ABNORMAL HIGH (ref 70–99)

## 2020-09-26 MED ORDER — BENZONATATE 100 MG PO CAPS
200.0000 mg | ORAL_CAPSULE | Freq: Three times a day (TID) | ORAL | Status: DC
  Administered 2020-09-26: 200 mg via ORAL
  Filled 2020-09-26 (×5): qty 2

## 2020-09-26 MED ORDER — PREDNISONE 20 MG PO TABS
20.0000 mg | ORAL_TABLET | Freq: Once | ORAL | Status: AC
  Administered 2020-09-26: 20 mg via ORAL
  Filled 2020-09-26: qty 1

## 2020-09-26 MED ORDER — POTASSIUM CHLORIDE CRYS ER 20 MEQ PO TBCR
40.0000 meq | EXTENDED_RELEASE_TABLET | Freq: Once | ORAL | Status: AC
  Administered 2020-09-26: 40 meq via ORAL
  Filled 2020-09-26: qty 2

## 2020-09-26 MED ORDER — PREDNISONE 20 MG PO TABS
40.0000 mg | ORAL_TABLET | Freq: Every day | ORAL | Status: DC
  Administered 2020-09-27 – 2020-09-29 (×3): 40 mg via ORAL
  Filled 2020-09-26 (×3): qty 2

## 2020-09-26 NOTE — Progress Notes (Addendum)
At 1245, entered patient's room because low O2 sat alarm was going off.  Pt was on venti mask at 8 L/min.  Pt was coughing and felt anxious.  Sats dropped at lowest during coughing to mid-sixties.  I bumped the O2 back up to 10 L/min and administered cough syrup.  Pt is resting comfortably on 10 L/min on a venti O2 sats in upper 80s, low 90s depending on pt activity.  Notified Dr. Maryland Pink

## 2020-09-26 NOTE — Progress Notes (Signed)
Wendy Ruiz  WVP:[id number] DOB: [birth year] DOA: 08/29/2020 PCP: Lyndee Hensen, DO    Brief Narrative:  64yo with a history of bipolar disorder, obesity, and GERD who was admitted to the hospital 9/18 with severe acute hypoxic respiratory failure and diagnosed with Covid pneumonia. The patient was not vaccinated against Covid. The patient was under the care of PCCM in the ICU and BiPAP dependent for a significant portion of the hospital stay. She was transferred to the hospital service 9/29. At that time she was requiring 50 L of heated high flow nasal cannula support with additional nonrebreather mask support.  Significant Events:  9/18 admit via [place]  9/19 CTa chest no PE -near confluent dense groundglass opacities all lobes bilaterally 9/21 TTE unrevealing 9/23 bilateral lower extremity venous duplex negative 9/29 [facility] assumed care from [facility]  10/2 bilateral lower extremity venous duplex negative 10/3 CTa chest without PE  Date of Positive COVID Test:  9/19  Date Isolation Ends: 10/9  Vaccination Status: Not vaccinated against Covid  COVID-19 specific Treatment: Baricitinib 9/19 > 10/2 Steroid 9/18 > Remdesivir 9/19 > 9/23  Antimicrobials:  Cefepime 9/23 > 9/27 Levaquin 9/29 > 10/2  DVT prophylaxis: Full dose Lovenox  Subjective: Patient mentions that she is feeling slightly better.  Denies any new complaints.  Cough is bothering her at times mainly in the early morning hours as well as in the evening hours.  Occasional sputum is noted but dry for the most part.  No chest pain.    Assessment & Plan:  Acute respiratory failure with hypoxia/pneumonia due to COVID-19  Has completed a course of remdesivir and baricitinib Remains on oral steroids. Chest x-ray from 10/13 did not show any significant change as compared to before.   Patient has declined IV access.  Midline had to be removed as it was not functioning.   She is noted to be down to 8 L  via Ventimask.  FiO2 is unknown.  Have discussed with nursing staff who will have the patient evaluated by RT to see if we can switch her to nasal cannula today. Chest x-ray was done this morning.  Report is pending. Continue inhaled steroids.  Continue oral furosemide. Noted to be slightly tachycardic this morning.  We will continue to monitor. Antitussive agents.  Elevated D-dimer Given clinical scenario patient was being dosed with full dose Lovenox. CTA x2 and venous duplex lower extremities x2 negative for thrombus. She was transitioned to prophylactic dose Lovenox with falling D-dimer.  Normocytic anemia Hemoglobin has been stable.  No evidence of overt blood loss.    Transaminitis Likely secondary to COVID-19. It is gradually improving.  Continue to check periodically.  Indeterminate QuantiFERON Obtained while in ICU. No evidence on CTA chest of active tuberculosis.  GERD Continue PPI  Mild tachycardia TSH normal.  Echocardiogram did not show any significant abnormalities except for elevated pulmonary artery systolic pressure.  Occasionally noted to be tachycardic mainly with exertion.  DVT prophylaxis: Lovenox Code Status: FULL CODE Family Communication: No family present at time of exam Status is: Inpatient  Remains inpatient appropriate because:Inpatient level of care appropriate due to severity of illness   Dispo: The patient is from: Home              Anticipated d/c is to: unclear              Anticipated d/c date is: 3 days  Patient currently is not medically stable to d/c.   Consultants:  PCCM  Objective: Blood pressure 111/78, pulse 99, temperature 98.1 F (36.7 C), resp. rate 19, height 5\' 4"  (1.626 m), weight 103.3 kg, SpO2 (!) 88 %.  Intake/Output Summary (Last 24 hours) at 09/26/2020 0955 Last data filed at 09/25/2020 1330 Gross per 24 hour  Intake 177 ml  Output --  Net 177 ml   Filed Weights   09/04/20 1500 09/07/20 0500 09/25/20  0500  Weight: 106.9 kg 108.5 kg 103.3 kg    Examination:  General appearance: Awake alert.  In no distress Resp: Mildly tachypneic at rest.  Crackles bilateral bases as before.  No wheezing or rhonchi.   Cardio: S1-S2 is mildly tachycardic regular.  No S3-S4.  No rubs murmurs or bruit GI: Abdomen is soft.  Nontender nondistended.  Bowel sounds are present normal.  No masses organomegaly Extremities: Improved edema bilateral lower extremities Neurologic: Alert and oriented x3.  No focal neurological deficits.       CBC: Recent Labs  Lab 09/20/20 0500 09/24/20 0906  WBC 14.9* 13.0*  HGB 10.5* 10.3*  HCT 35.5* 34.8*  MCV 90.6 89.2  PLT 201 161   Basic Metabolic Panel: Recent Labs  Lab 09/20/20 0500 09/20/20 0500 09/24/20 0906 09/25/20 0144 09/26/20 0045  NA 136   < > 136 136 136  K 4.4   < > 3.7 4.0 3.5  CL 96*   < > 94* 92* 92*  CO2 31   < > 30 33* 32  GLUCOSE 98   < > 173* 127* 127*  BUN 17   < > 9 10 11   CREATININE 0.69   < > 0.79 0.63 0.71  CALCIUM 9.0   < > 9.1 9.1 9.4  MG 2.3  --   --   --   --    < > = values in this interval not displayed.   GFR: Estimated Creatinine Clearance: 85.3 mL/min (by C-G formula based on SCr of 0.71 mg/dL).  Liver Function Tests: Recent Labs  Lab 09/20/20 0500 09/24/20 0906 09/25/20 0144 09/26/20 0045  AST 60* 51* 45* 44*  ALT 211* 135* 132* 130*  ALKPHOS 54 58 54 60  BILITOT 0.5 0.6 0.6 0.8  PROT 6.7 6.3* 6.4* 6.7  ALBUMIN 2.7* 2.8* 2.9* 3.0*    HbA1C: Hgb A1c MFr Bld  Date/Time Value Ref Range Status  08/31/2020 05:18 AM 5.7 (H) 4.8 - 5.6 % Final    Comment:    (NOTE) Pre diabetes:          5.7%-6.4%  Diabetes:              >6.4%  Glycemic control for   <7.0% adults with diabetes     CBG: Recent Labs  Lab 09/25/20 0814 09/25/20 1146 09/25/20 1722 09/25/20 2021 09/26/20 0809  GLUCAP 168* 180* 222* 158* 181*    No results found for this or any previous visit (from the past 240 hour(s)).    Scheduled Meds: . (feeding supplement) PROSource Plus  30 mL Oral BID BM  . Chlorhexidine Gluconate Cloth  6 each Topical Daily  . enoxaparin (LOVENOX) injection  55 mg Subcutaneous Q24H  . feeding supplement  237 mL Oral TID  . furosemide  80 mg Oral Daily  . insulin aspart  0-9 Units Subcutaneous TID WC  . ipratropium  2 puff Inhalation Q6H  . mouth rinse  15 mL Mouth Rinse BID  . metoprolol tartrate  12.5  mg Oral BID  . mometasone-formoterol  2 puff Inhalation BID  . pantoprazole  40 mg Oral Q1200  . polyethylene glycol  17 g Oral BID  . predniSONE  20 mg Oral Q breakfast      LOS: 27 days   [facility] Office  [phone] Pager - Text Page per Shea Evans  If 7PM-7AM, please contact night-coverage per Amion 09/26/2020, 9:55 AM

## 2020-09-27 DIAGNOSIS — J1282 Pneumonia due to coronavirus disease 2019: Secondary | ICD-10-CM | POA: Diagnosis not present

## 2020-09-27 DIAGNOSIS — U071 COVID-19: Secondary | ICD-10-CM | POA: Diagnosis not present

## 2020-09-27 DIAGNOSIS — J9601 Acute respiratory failure with hypoxia: Secondary | ICD-10-CM | POA: Diagnosis not present

## 2020-09-27 LAB — BASIC METABOLIC PANEL
Anion gap: 13 (ref 5–15)
BUN: 15 mg/dL (ref 8–23)
CO2: 30 mmol/L (ref 22–32)
Calcium: 9.9 mg/dL (ref 8.9–10.3)
Chloride: 92 mmol/L — ABNORMAL LOW (ref 98–111)
Creatinine, Ser: 0.79 mg/dL (ref 0.44–1.00)
GFR, Estimated: >60 mL/min (ref >60)
Glucose, Bld: 165 mg/dL — ABNORMAL HIGH (ref 70–99)
Potassium: 4.4 mmol/L (ref 3.5–5.1)
Sodium: 135 mmol/L (ref 135–145)

## 2020-09-27 LAB — GLUCOSE, CAPILLARY
Glucose-Capillary: 124 mg/dL — ABNORMAL HIGH (ref 70–99)
Glucose-Capillary: 204 mg/dL — ABNORMAL HIGH (ref 70–99)
Glucose-Capillary: 254 mg/dL — ABNORMAL HIGH (ref 70–99)
Glucose-Capillary: 209 mg/dL — ABNORMAL HIGH (ref 70–99)

## 2020-09-27 LAB — MAGNESIUM: Magnesium: 2.1 mg/dL (ref 1.7–2.4)

## 2020-09-27 MED ORDER — GUAIFENESIN 100 MG/5ML PO SOLN
5.0000 mL | ORAL | Status: DC | PRN
  Administered 2020-09-27: 100 mg via ORAL
  Filled 2020-09-27 (×3): qty 5

## 2020-09-27 MED ORDER — HYDROCOD POLST-CPM POLST ER 10-8 MG/5ML PO SUER: 5.0000 mL | Freq: Two times a day (BID) | ORAL | Status: DC | PRN

## 2020-09-27 MED ORDER — HYDROCOD POLST-CPM POLST ER 10-8 MG/5ML PO SUER
5.0000 mL | Freq: Two times a day (BID) | ORAL | Status: DC | PRN
  Administered 2020-09-27 – 2020-09-28 (×3): 5 mL via ORAL
  Filled 2020-09-27 (×4): qty 5

## 2020-09-27 MED ORDER — FUROSEMIDE 40 MG PO TABS
40.0000 mg | ORAL_TABLET | Freq: Every day | ORAL | Status: DC
  Administered 2020-09-28 – 2020-09-29 (×2): 40 mg via ORAL
  Filled 2020-09-27 (×2): qty 1

## 2020-09-27 NOTE — Progress Notes (Addendum)
Wendy Ruiz  [id number] DOB: 1956-06-26 DOA: 08/29/2020 PCP: Lyndee Hensen, DO    Brief Narrative:  64yo with a history of bipolar disorder, obesity, and GERD who was admitted to the hospital 9/18 with severe acute hypoxic respiratory failure and diagnosed with Covid pneumonia. The patient was not vaccinated against Covid. The patient was under the care of PCCM in the ICU and BiPAP dependent for a significant portion of the hospital stay. She was transferred to the hospital service 9/29. At that time she was requiring 50 L of heated high flow nasal cannula support with additional nonrebreather mask support.  Significant Events:  9/18 admit via [place]  9/19 CTa chest no PE -near confluent dense groundglass opacities all lobes bilaterally 9/21 TTE unrevealing 9/23 bilateral lower extremity venous duplex negative 9/29 TRH assumed care from [facility]  10/2 bilateral lower extremity venous duplex negative 10/3 CTa chest without PE  Date of Positive COVID Test:  9/19  Date Isolation Ends: 10/9  Vaccination Status: Not vaccinated against Covid  COVID-19 specific Treatment: Baricitinib 9/19 > 10/2 Steroid 9/18 > Remdesivir 9/19 > 9/23  Antimicrobials:  Cefepime 9/23 > 9/27 Levaquin 9/29 > 10/2  DVT prophylaxis: Full dose Lovenox  Subjective: Patient mentions that she feels about the same.  No complaints offered other than her shortness of breath especially when she ambulates.  Continues to have a dry cough.  Tessalon Perles has not helped much so far.  Yesterday's event noted.  Patient on higher amounts of oxygen this morning.  Assessment & Plan:  Acute respiratory failure with hypoxia/pneumonia due to COVID-19  Has completed a course of remdesivir and baricitinib Remains on oral steroids.  Dose of prednisone was increased from 20 to 40 mg daily. Oxygen requirements are fluctuating between 8 to 12 L.  Saturations in the late 80s for the most part.  No  increased work of breathing noted today.  Chest x-ray done yesterday morning did not show any changes compared to the previous films. Continue inhaled steroids.  Continue oral furosemide.  We will however decrease the dose starting tomorrow.  Patient declined IV access. Tachycardia seems to be stable.  Change antitussive agents to provide better relief.  Sinus tachycardia TSH normal.  Echocardiogram did not show any significant abnormalities except for elevated pulmonary artery systolic pressure.  Occasionally noted to be tachycardic mainly with exertion.  Elevated D-dimer Given clinical scenario patient was being dosed with full dose Lovenox. CTA x2 and venous duplex lower extremities x2 negative for thrombus.  She was transitioned to prophylactic dose Lovenox with falling D-dimer.  Normocytic anemia Hemoglobin has been stable.  No evidence of overt blood loss.    Transaminitis Likely secondary to COVID-19. It is gradually improving.  Continue to check periodically.  Indeterminate QuantiFERON Obtained while in ICU. No evidence on CTA chest of active tuberculosis.  GERD Continue PPI  DVT prophylaxis: Lovenox Code Status: FULL CODE Family Communication: No family present at time of exam Status is: Inpatient  Remains inpatient appropriate because:Inpatient level of care appropriate due to severity of illness   Dispo: The patient is from: Home              Anticipated d/c is to: unclear              Anticipated d/c date is: 3 days              Patient currently is not medically stable to d/c.   Consultants:  PCCM  Objective: Blood pressure 118/79, pulse 67, temperature 98.1 F (36.7 C), temperature source Oral, resp. rate 18, height 5\' 4"  (1.626 m), weight 103.3 kg, SpO2 95 %.  Intake/Output Summary (Last 24 hours) at 09/27/2020 0903 Last data filed at 09/27/2020 0900 Gross per 24 hour  Intake 470 ml  Output --  Net 470 ml   Filed Weights   09/04/20 1500 09/07/20 0500  09/25/20 0500  Weight: 106.9 kg 108.5 kg 103.3 kg    Examination:  General appearance: Awake alert.  In no distress Resp: Mildly tachypneic at rest.  No use of accessory muscles noted.  Crackles bilateral bases as before.  No wheezing or rhonchi. Cardio: S1-S2 is mildly tachycardic, regular.  No S3-S4.  No rubs murmurs or bruit GI: Abdomen is soft.  Nontender nondistended.  Bowel sounds are present normal.  No masses organomegaly Extremities: Improved edema.  Full range of motion of lower extremities. Neurologic: Alert and oriented x3.  No focal neurological deficits.       CBC: Recent Labs  Lab 09/24/20 0906  WBC 13.0*  HGB 10.3*  HCT 34.8*  MCV 89.2  PLT 500   Basic Metabolic Panel: Recent Labs  Lab 09/25/20 0144 09/26/20 0045 09/27/20 0209  NA 136 136 135  K 4.0 3.5 4.4  CL 92* 92* 92*  CO2 33* 32 30  GLUCOSE 127* 127* 165*  BUN 10 11 15   CREATININE 0.63 0.71 0.79  CALCIUM 9.1 9.4 9.9  MG  --   --  2.1   GFR: Estimated Creatinine Clearance: 85.3 mL/min (by C-G formula based on SCr of 0.79 mg/dL).  Liver Function Tests: Recent Labs  Lab 09/24/20 0906 09/25/20 0144 09/26/20 0045  AST 51* 45* 44*  ALT 135* 132* 130*  ALKPHOS 58 54 60  BILITOT 0.6 0.6 0.8  PROT 6.3* 6.4* 6.7  ALBUMIN 2.8* 2.9* 3.0*    HbA1C: Hgb A1c MFr Bld  Date/Time Value Ref Range Status  08/31/2020 05:18 AM 5.7 (H) 4.8 - 5.6 % Final    Comment:    (NOTE) Pre diabetes:          5.7%-6.4%  Diabetes:              >6.4%  Glycemic control for   <7.0% adults with diabetes     CBG: Recent Labs  Lab 09/26/20 0809 09/26/20 1218 09/26/20 1725 09/26/20 2012 09/27/20 0740  GLUCAP 181* 287* 247* 241* 124*    No results found for this or any previous visit (from the past 240 hour(s)).   Scheduled Meds: . (feeding supplement) PROSource Plus  30 mL Oral BID BM  . benzonatate  200 mg Oral TID  . Chlorhexidine Gluconate Cloth  6 each Topical Daily  . enoxaparin (LOVENOX)  injection  55 mg Subcutaneous Q24H  . feeding supplement  237 mL Oral TID  . furosemide  80 mg Oral Daily  . insulin aspart  0-9 Units Subcutaneous TID WC  . ipratropium  2 puff Inhalation Q6H  . mouth rinse  15 mL Mouth Rinse BID  . metoprolol tartrate  12.5 mg Oral BID  . mometasone-formoterol  2 puff Inhalation BID  . pantoprazole  40 mg Oral Q1200  . polyethylene glycol  17 g Oral BID  . predniSONE  40 mg Oral Q breakfast      LOS: 28 days   [place] Office  [phone] Pager - Text Page per Shea Evans  If 7PM-7AM, please contact night-coverage per Amion 09/27/2020,  9:03 AM

## 2020-09-28 DIAGNOSIS — J9601 Acute respiratory failure with hypoxia: Secondary | ICD-10-CM | POA: Diagnosis not present

## 2020-09-28 DIAGNOSIS — U071 COVID-19: Secondary | ICD-10-CM | POA: Diagnosis not present

## 2020-09-28 DIAGNOSIS — J1282 Pneumonia due to coronavirus disease 2019: Secondary | ICD-10-CM | POA: Diagnosis not present

## 2020-09-28 LAB — GLUCOSE, CAPILLARY
Glucose-Capillary: 105 mg/dL — ABNORMAL HIGH (ref 70–99)
Glucose-Capillary: 383 mg/dL — ABNORMAL HIGH (ref 70–99)
Glucose-Capillary: 292 mg/dL — ABNORMAL HIGH (ref 70–99)
Glucose-Capillary: 193 mg/dL — ABNORMAL HIGH (ref 70–99)

## 2020-09-28 LAB — BASIC METABOLIC PANEL
Anion gap: 13 (ref 5–15)
BUN: 17 mg/dL (ref 8–23)
CO2: 31 mmol/L (ref 22–32)
Calcium: 9.3 mg/dL (ref 8.9–10.3)
Chloride: 92 mmol/L — ABNORMAL LOW (ref 98–111)
Creatinine, Ser: 0.83 mg/dL (ref 0.44–1.00)
GFR, Estimated: >60 mL/min (ref >60)
Glucose, Bld: 123 mg/dL — ABNORMAL HIGH (ref 70–99)
Potassium: 3.7 mmol/L (ref 3.5–5.1)
Sodium: 136 mmol/L (ref 135–145)

## 2020-09-28 MED ORDER — ENOXAPARIN SODIUM 60 MG/0.6ML ~~LOC~~ SOLN
50.0000 mg | SUBCUTANEOUS | Status: DC
  Administered 2020-09-28: 50 mg via SUBCUTANEOUS
  Filled 2020-09-28: qty 0.6

## 2020-09-28 NOTE — Progress Notes (Signed)
Wendy Ruiz  QJJ:[id number] DOB: 1956-10-04 DOA: 08/29/2020 PCP: Lyndee Hensen, DO    Brief Narrative:  64yo with a history of bipolar disorder, obesity, and GERD who was admitted to the hospital 9/18 with severe acute hypoxic respiratory failure and diagnosed with Covid pneumonia. The patient was not vaccinated against Covid. The patient was under the care of PCCM in the ICU and BiPAP dependent for a significant portion of the hospital stay. She was transferred to the hospital service 9/29. At that time she was requiring 50 L of heated high flow nasal cannula support with additional nonrebreather mask support.  Significant Events:  9/18 admit via [place]  9/19 CTa chest no PE -near confluent dense groundglass opacities all lobes bilaterally 9/21 TTE unrevealing 9/23 bilateral lower extremity venous duplex negative 9/29 TRH assumed care from [facility]  10/2 bilateral lower extremity venous duplex negative 10/3 CTa chest without PE  Date of Positive COVID Test:  9/19  Date Isolation Ends: 10/9  Vaccination Status: Not vaccinated against Covid  COVID-19 specific Treatment: Baricitinib 9/19 > 10/2 Steroid 9/18 > Remdesivir 9/19 > 9/23  Antimicrobials:  Cefepime 9/23 > 9/27 Levaquin 9/29 > 10/2  DVT prophylaxis: Full dose Lovenox  Subjective: Patient mentions that she is starting to feel better.  Reports improvement in her shortness of breath over the last 24 to 48 hours.  Continues to have a cough though it is better than before.    Assessment & Plan:  Acute respiratory failure with hypoxia/pneumonia due to COVID-19  Has completed a course of remdesivir and baricitinib Remains on oral steroids.  Dose of prednisone was increased from 20 to 40 mg daily yesterday. Patient's oxygen requirements appear to have improved in the last 24 to 48 hours.  She is tolerating nasal canula and is currently on 4 L, saturating in the early 90s. This is a significant  improvement.  Continue inhaled steroids and oral furosemide for now.  Tachycardia stable.  Continue with antitussive agents. Mobilize.  Discussed with nursing staff.  Patient to be ambulated in the hallway today to see how she does.  Sinus tachycardia TSH normal.  Echocardiogram did not show any significant abnormalities except for elevated pulmonary artery systolic pressure.  Tachycardia seems to be stable.  Noted to be on metoprolol.  Elevated D-dimer Given clinical scenario patient was being dosed with full dose Lovenox. CTA x2 and venous duplex lower extremities x2 negative for thrombus.  She was transitioned to prophylactic dose Lovenox with falling D-dimer.  Normocytic anemia Hemoglobin has been stable.  No evidence of overt blood loss.    Transaminitis Likely secondary to COVID-19. It is gradually improving.  Continue to check periodically.  Will check tomorrow.  Indeterminate QuantiFERON Obtained while in ICU. No evidence on CTA chest of active tuberculosis.  GERD Continue PPI  DVT prophylaxis: Lovenox Code Status: FULL CODE Family Communication: No family present at time of exam Status is: Inpatient  Remains inpatient appropriate because:Inpatient level of care appropriate due to severity of illness   Dispo: The patient is from: Home              Anticipated d/c is to: unclear              Anticipated d/c date is: 3 days              Patient currently is not medically stable to d/c.   Consultants:  PCCM  Objective: Blood pressure 108/68, pulse 91, temperature 97.6 F (  36.4 C), resp. rate (!) 22, height 5\' 4"  (1.626 m), weight 103.3 kg, SpO2 95 %. No intake or output data in the 24 hours ending 09/28/20 0954 Filed Weights   09/04/20 1500 09/07/20 0500 09/25/20 0500  Weight: 106.9 kg 108.5 kg 103.3 kg    Examination:  General appearance: Awake alert.  In no distress Resp: Less tachypneic.  Improved air entry though continues to have crackles at the bases.  No  wheezing or rhonchi.   Cardio: S1-S2 is mildly tachycardic, regular.  No S3-S4.  No rubs murmurs or bruit GI: Abdomen is soft.  Nontender nondistended.  Bowel sounds are present normal.  No masses organomegaly Extremities: No edema.  Moving all her lower extremities Neurologic: Alert and oriented x3.  No focal neurological deficits.      CBC: Recent Labs  Lab 09/24/20 0906  WBC 13.0*  HGB 10.3*  HCT 34.8*  MCV 89.2  PLT 659   Basic Metabolic Panel: Recent Labs  Lab 09/26/20 0045 09/27/20 0209 09/28/20 0236  NA 136 135 136  K 3.5 4.4 3.7  CL 92* 92* 92*  CO2 32 30 31  GLUCOSE 127* 165* 123*  BUN 11 15 17   CREATININE 0.71 0.79 0.83  CALCIUM 9.4 9.9 9.3  MG  --  2.1  --    GFR: Estimated Creatinine Clearance: 82.2 mL/min (by C-G formula based on SCr of 0.83 mg/dL).  Liver Function Tests: Recent Labs  Lab 09/24/20 0906 09/25/20 0144 09/26/20 0045  AST 51* 45* 44*  ALT 135* 132* 130*  ALKPHOS 58 54 60  BILITOT 0.6 0.6 0.8  PROT 6.3* 6.4* 6.7  ALBUMIN 2.8* 2.9* 3.0*    HbA1C: Hgb A1c MFr Bld  Date/Time Value Ref Range Status  08/31/2020 05:18 AM 5.7 (H) 4.8 - 5.6 % Final    Comment:    (NOTE) Pre diabetes:          5.7%-6.4%  Diabetes:              >6.4%  Glycemic control for   <7.0% adults with diabetes     CBG: Recent Labs  Lab 09/27/20 0740 09/27/20 1211 09/27/20 1608 09/27/20 2119 09/28/20 0751  GLUCAP 124* 204* 254* 209* 105*    No results found for this or any previous visit (from the past 240 hour(s)).   Scheduled Meds: . (feeding supplement) PROSource Plus  30 mL Oral BID BM  . benzonatate  200 mg Oral TID  . enoxaparin (LOVENOX) injection  55 mg Subcutaneous Q24H  . feeding supplement  237 mL Oral TID  . furosemide  40 mg Oral Daily  . insulin aspart  0-9 Units Subcutaneous TID WC  . ipratropium  2 puff Inhalation Q6H  . mouth rinse  15 mL Mouth Rinse BID  . metoprolol tartrate  12.5 mg Oral BID  . mometasone-formoterol  2  puff Inhalation BID  . pantoprazole  40 mg Oral Q1200  . polyethylene glycol  17 g Oral BID  . predniSONE  40 mg Oral Q breakfast      LOS: 29 days   [place] Hospitalists Office  [phone] Pager - Text Page per Shea Evans  If 7PM-7AM, please contact night-coverage per Amion 09/28/2020, 9:54 AM

## 2020-09-28 NOTE — Progress Notes (Signed)
Pt's has  O2 sats of 98% at 4l/[place] at rest. Oxygen saturation drops to 79% just from transferring from the recliner to the bed. It takes a few minutes for her to recover and raises her sats. Again.

## 2020-09-28 NOTE — Progress Notes (Signed)
Inpatient Diabetes Program Recommendations  AACE/ADA: New Consensus Statement on Inpatient Glycemic Control (2015)  Target Ranges:  Prepandial:   less than 140 mg/dL      Peak postprandial:   less than 180 mg/dL (1-2 hours)      Critically ill patients:  140 - 180 mg/dL   Lab Results  Component Value Date   GLUCAP 383 (H) 09/28/2020   HGBA1C 5.7 (H) 08/31/2020    Review of Glycemic Control Results for ALLEYA, DEMETER (MRN [id number]) as of 09/28/2020 13:13  Ref. Range 09/27/2020 07:40 09/27/2020 12:11 09/27/2020 16:08 09/27/2020 21:19 09/28/2020 07:51 09/28/2020 12:02  Glucose-Capillary Latest Ref Range: 70 - 99 mg/dL 124 (H) 204 (H) 254 (H) 209 (H) 105 (H) 383 (H)   Diabetes history: none Current orders for Inpatient glycemic control:  Novolog 0-9 units tid  Ensure Enlive tid PO prednisone 40 mg Daily  Inpatient Diabetes Program Recommendations:    Glucose trends increase after meal intake. Consider:  -  Add Novolog 3-4 units tid meal coverage if eating >50% of meals.  Thanks,  Tama Headings RN, MSN, BC-ADM Inpatient Diabetes Coordinator Team Pager [phone] (8a-5p)

## 2020-09-28 NOTE — TOC Initial Note (Addendum)
Transition of Care [facility]) - Initial/Assessment Note    Patient Details  Name: Wendy Ruiz MRN: [id number] Date of Birth: May 27, 1956  [facility] CM/SW Contact:    Wendy Favre, RN Phone Number: 09/28/2020, 4:09 PM  Clinical Narrative:                 Patient from home. Plans to discharge to her daughter's home. Daughter is Wendy Ruiz , address: [address] , [address], [place], [place] [address], Patient's cell is [phone]   Patient is uninsured.   Discussed MATCH program . Medication through [facility] one time per year, co pays will be $3 per prescription. Patient does want to use [facility].   Discussed home oxygen , 3 in 1 and walker . [place] spoke with [facility]. They will call patient and get financial information and provide cost of DME. Patient aware.   Also, orders for home health PT/OT. Explained four agencies rotate to help uninsured patient's . Today it is [place] , referral given to [facility] with [facility]. [facility] will also call patient for financial information patient aware.   NCM requested ambulatory oxygen saturation note from bedside nurse.    Will follow up with [place] , [place] and [facility], and for oxygen saturation note .   Discussed all of above with patient's daughter Wendy Ruiz via speaker phone with patient. Both voiced understanding.  Expected Discharge Plan: [place] Barriers to Discharge: Inadequate or no insurance   Patient Goals and CMS Choice Patient states their goals for this hospitalization and ongoing recovery are:: to go to her daughters home at discharge. CMS Medicare.gov Compare Post Acute Care list provided to:: Patient Choice offered to / list presented to : Patient  Expected Discharge Plan and Services Expected Discharge Plan: [place] In-house Referral: Development worker, community, Clinical Social Work Discharge Planning Services: CM Consult Post Acute Care Choice:  Home Health, Durable Medical Equipment Living arrangements for the past 2 months: [place]                 DME Arranged: 3-N-1, Walker rolling with seat, Oxygen DME Agency: [employer] Date DME Agency Contacted: 09/28/20 Time DME Agency Contacted: 1194 Representative spoke with at DME Agency: [place]: PT, OT [place] Agency: [place] ([place]) Date [place]: 09/28/20 Time [place]: 1607 Representative spoke with at [place]: [place]  Prior Living Arrangements/Services Living arrangements for the past 2 months: [place] with:: Self Patient language and need for interpreter reviewed:: Yes Do you feel safe going back to the place where you live?: Yes      Need for Family Participation in Patient Care: Yes (Comment) Care giver support system in place?: Yes (comment)   Criminal Activity/Legal Involvement Pertinent to Current Situation/Hospitalization: No - Comment as needed  Activities of Daily Living Home Assistive Devices/Equipment: None ADL Screening (condition at time of admission) Patient's cognitive ability adequate to safely complete daily activities?: Yes Is the patient deaf or have difficulty hearing?: No Does the patient have difficulty seeing, even when wearing glasses/contacts?: No Does the patient have difficulty concentrating, remembering, or making decisions?: No Patient able to express need for assistance with ADLs?: Yes Does the patient have difficulty dressing or bathing?: Yes Independently performs ADLs?: Yes (appropriate for developmental age) Does the patient have difficulty walking or climbing stairs?: Yes Weakness of Legs: Both Weakness of Arms/Hands:  None  Permission Sought/Granted   Permission granted to share information with : No              Emotional Assessment Appearance:: Appears stated age Attitude/Demeanor/Rapport: Engaged Affect (typically observed): Accepting Orientation: :  Oriented to Self, Oriented to Place, Oriented to  Time, Oriented to Situation Alcohol / Substance Use: Not Applicable Psych Involvement: No (comment)  Admission diagnosis:  Acute respiratory failure with hypoxia ([place]) [J96.01] Pneumonia due to COVID-19 virus [U07.1, J12.82] Patient Active Problem List   Diagnosis Date Noted  . ARDS (adult respiratory distress syndrome) ([place])   . Morbid obesity ([place])   . Hyperglycemia   . Pneumonia due to COVID-19 virus 08/30/2020  . Acute respiratory failure with hypoxia ([place])   . Close exposure to COVID-19 virus   . Right flank pain 05/14/2013  . Cough 11/15/2011  . OBESITY NOS 05/22/2007  . BIPOLAR DISORDER 02/08/2007   PCP:  Lyndee Hensen, DO Pharmacy:   [facility] [address] ([address] [place] - [address] [place] [address] Phone: [phone] Fax: [phone]  [facility] [place] [place] - [address] [place] [address] Phone: [phone] Fax: [phone]     Social Determinants of Health (SDOH) Interventions    Readmission Risk Interventions No flowsheet data found.

## 2020-09-29 ENCOUNTER — Other Ambulatory Visit (HOSPITAL_COMMUNITY): Payer: Self-pay | Admitting: Internal Medicine

## 2020-09-29 DIAGNOSIS — U071 COVID-19: Secondary | ICD-10-CM | POA: Diagnosis not present

## 2020-09-29 DIAGNOSIS — J1282 Pneumonia due to coronavirus disease 2019: Secondary | ICD-10-CM | POA: Diagnosis not present

## 2020-09-29 LAB — COMPREHENSIVE METABOLIC PANEL
ALT: 159 U/L — ABNORMAL HIGH (ref 0–44)
AST: 66 U/L — ABNORMAL HIGH (ref 15–41)
Albumin: 2.8 g/dL — ABNORMAL LOW (ref 3.5–5.0)
Alkaline Phosphatase: 55 U/L (ref 38–126)
Anion gap: 12 (ref 5–15)
BUN: 13 mg/dL (ref 8–23)
CO2: 32 mmol/L (ref 22–32)
Calcium: 9.1 mg/dL (ref 8.9–10.3)
Chloride: 92 mmol/L — ABNORMAL LOW (ref 98–111)
Creatinine, Ser: 0.76 mg/dL (ref 0.44–1.00)
GFR, Estimated: >60 mL/min (ref >60)
Glucose, Bld: 127 mg/dL — ABNORMAL HIGH (ref 70–99)
Potassium: 3.5 mmol/L (ref 3.5–5.1)
Sodium: 136 mmol/L (ref 135–145)
Total Bilirubin: 0.7 mg/dL (ref 0.3–1.2)
Total Protein: 6 g/dL — ABNORMAL LOW (ref 6.5–8.1)

## 2020-09-29 LAB — GLUCOSE, CAPILLARY
Glucose-Capillary: 140 mg/dL — ABNORMAL HIGH (ref 70–99)
Glucose-Capillary: 239 mg/dL — ABNORMAL HIGH (ref 70–99)

## 2020-09-29 MED ORDER — FUROSEMIDE 20 MG PO TABS: 20.0000 mg | ORAL_TABLET | Freq: Every day | ORAL | 0 refills | Status: DC

## 2020-09-29 MED ORDER — POLYETHYLENE GLYCOL 3350 17 G PO PACK: 17.0000 g | PACK | Freq: Every day | ORAL | 0 refills | Status: DC | PRN

## 2020-09-29 MED ORDER — ADULT MULTIVITAMIN W/MINERALS CH
1.0000 | ORAL_TABLET | Freq: Every day | ORAL | Status: DC
  Administered 2020-09-29: 1 via ORAL
  Filled 2020-09-29: qty 1

## 2020-09-29 MED ORDER — MOMETASONE FURO-FORMOTEROL FUM 200-5 MCG/ACT IN AERO: 2.0000 | INHALATION_SPRAY | Freq: Two times a day (BID) | RESPIRATORY_TRACT | 1 refills | Status: DC

## 2020-09-29 MED ORDER — METOPROLOL TARTRATE 25 MG PO TABS
12.5000 mg | ORAL_TABLET | Freq: Two times a day (BID) | ORAL | 1 refills | Status: DC
Start: 2020-09-29 — End: 2020-10-14

## 2020-09-29 MED ORDER — PREDNISONE 20 MG PO TABS: ORAL_TABLET | ORAL | 0 refills | Status: DC

## 2020-09-29 MED ORDER — POTASSIUM CHLORIDE CRYS ER 20 MEQ PO TBCR
40.0000 meq | EXTENDED_RELEASE_TABLET | Freq: Once | ORAL | Status: AC
  Administered 2020-09-29: 40 meq via ORAL
  Filled 2020-09-29: qty 2

## 2020-09-29 MED ORDER — POTASSIUM CHLORIDE ER 20 MEQ PO TBCR: 20.0000 meq | EXTENDED_RELEASE_TABLET | Freq: Every day | ORAL | 0 refills | Status: DC

## 2020-09-29 MED ORDER — PROSOURCE PLUS PO LIQD
30.0000 mL | Freq: Three times a day (TID) | ORAL | Status: DC
  Administered 2020-09-29: 30 mL via ORAL

## 2020-09-29 MED ORDER — PANTOPRAZOLE SODIUM 40 MG PO TBEC: 40.0000 mg | DELAYED_RELEASE_TABLET | Freq: Every day | ORAL | 0 refills | Status: DC

## 2020-09-29 MED ORDER — HYDROCOD POLST-CPM POLST ER 10-8 MG/5ML PO SUER
5.0000 mL | Freq: Two times a day (BID) | ORAL | 0 refills | Status: DC | PRN
Start: 2020-09-29 — End: 2021-01-27

## 2020-09-29 MED ORDER — BENZONATATE 200 MG PO CAPS
200.0000 mg | ORAL_CAPSULE | Freq: Three times a day (TID) | ORAL | 0 refills | Status: DC
Start: 2020-09-29 — End: 2020-10-26

## 2020-09-29 MED FILL — predniSONE 20 MG TABS: 20 | 15 days supply | Qty: 20 | Fill #0

## 2020-09-29 MED FILL — PANTOPRAZOLE SOD DR 40 MG T: 40 | 21 days supply | Qty: 21 | Fill #0

## 2020-09-29 MED FILL — METOPROLOL TARTRATE 25 MG T: 25 | 30 days supply | Qty: 30 | Fill #0

## 2020-09-29 MED FILL — DULERA 200 MCG/5 MCG INH: 200-5 | 30 days supply | Qty: 13 | Fill #0

## 2020-09-29 MED FILL — POLYETHYLENE GLYCOL 3350 PO: 17 | 14 days supply | Qty: 238 | Fill #0

## 2020-09-29 MED FILL — FUROSEMIDE 20 MG TAB: 20 | 7 days supply | Qty: 7 | Fill #0

## 2020-09-29 MED FILL — POTASSIUM CHLORIDE 20meqER: 20 | 7 days supply | Qty: 7 | Fill #0

## 2020-09-29 MED FILL — HYDROCODONE-CHLORPHEN ER SU: 10-8 | 14 days supply | Qty: 140 | Fill #0

## 2020-09-29 MED FILL — BENZONATATE 200 MG CAPS: 200 | 10 days supply | Qty: 30 | Fill #0

## 2020-09-29 NOTE — Discharge Instructions (Signed)
COVID-19 COVID-19 is a respiratory infection that is caused by a virus called severe acute respiratory syndrome coronavirus 2 (SARS-CoV-2). The disease is also known as coronavirus disease or novel coronavirus. In some people, the virus may not cause any symptoms. In others, it may cause a serious infection. The infection can get worse quickly and can lead to complications, such as:  Pneumonia, or infection of the lungs.  Acute respiratory distress syndrome or ARDS. This is a condition in which fluid build-up in the lungs prevents the lungs from filling with air and passing oxygen into the blood.  Acute respiratory failure. This is a condition in which there is not enough oxygen passing from the lungs to the body or when carbon dioxide is not passing from the lungs out of the body.  Sepsis or septic shock. This is a serious bodily reaction to an infection.  Blood clotting problems.  Secondary infections due to bacteria or fungus.  Organ failure. This is when your body's organs stop working. The virus that causes COVID-19 is contagious. This means that it can spread from person to person through droplets from coughs and sneezes (respiratory secretions). What are the causes? This illness is caused by a virus. You may catch the virus by:  Breathing in droplets from an infected person. Droplets can be spread by a person breathing, speaking, singing, coughing, or sneezing.  Touching something, like a table or a doorknob, that was exposed to the virus (contaminated) and then touching your mouth, nose, or eyes. What increases the risk? Risk for infection You are more likely to be infected with this virus if you:  Are within 6 feet (2 meters) of a person with COVID-19.  Provide care for or live with a person who is infected with COVID-19.  Spend time in crowded indoor spaces or live in shared housing. Risk for serious illness You are more likely to become seriously ill from the virus if you:   Are 50 years of age or older. The higher your age, the more you are at risk for serious illness.  Live in a nursing home or long-term care facility.  Have cancer.  Have a long-term (chronic) disease such as: ? Chronic lung disease, including chronic obstructive pulmonary disease or asthma. ? A long-term disease that lowers your body's ability to fight infection (immunocompromised). ? Heart disease, including heart failure, a condition in which the arteries that lead to the heart become narrow or blocked (coronary artery disease), a disease which makes the heart muscle thick, weak, or stiff (cardiomyopathy). ? Diabetes. ? Chronic kidney disease. ? Sickle cell disease, a condition in which red blood cells have an abnormal "sickle" shape. ? Liver disease.  Are obese. What are the signs or symptoms? Symptoms of this condition can range from mild to severe. Symptoms may appear any time from 2 to 14 days after being exposed to the virus. They include:  A fever or chills.  A cough.  Difficulty breathing.  Headaches, body aches, or muscle aches.  Runny or stuffy (congested) nose.  A sore throat.  New loss of taste or smell. Some people may also have stomach problems, such as nausea, vomiting, or diarrhea. Other people may not have any symptoms of COVID-19. How is this diagnosed? This condition may be diagnosed based on:  Your signs and symptoms, especially if: ? You live in an area with a COVID-19 outbreak. ? You recently traveled to or from an area where the virus is common. ? You   provide care for or live with a person who was diagnosed with COVID-19. ? You were exposed to a person who was diagnosed with COVID-19.  A physical exam.  Lab tests, which may include: ? Taking a sample of fluid from the back of your nose and throat (nasopharyngeal fluid), your nose, or your throat using a swab. ? A sample of mucus from your lungs (sputum). ? Blood tests.  Imaging tests, which  may include, X-rays, CT scan, or ultrasound. How is this treated? At present, there is no medicine to treat COVID-19. Medicines that treat other diseases are being used on a trial basis to see if they are effective against COVID-19. Your health care provider will talk with you about ways to treat your symptoms. For most people, the infection is mild and can be managed at home with rest, fluids, and over-the-counter medicines. Treatment for a serious infection usually takes places in a hospital intensive care unit (ICU). It may include one or more of the following treatments. These treatments are given until your symptoms improve.  Receiving fluids and medicines through an IV.  Supplemental oxygen. Extra oxygen is given through a tube in the nose, a face mask, or a hood.  Positioning you to lie on your stomach (prone position). This makes it easier for oxygen to get into the lungs.  Continuous positive airway pressure (CPAP) or bi-level positive airway pressure (BPAP) machine. This treatment uses mild air pressure to keep the airways open. A tube that is connected to a motor delivers oxygen to the body.  Ventilator. This treatment moves air into and out of the lungs by using a tube that is placed in your windpipe.  Tracheostomy. This is a procedure to create a hole in the neck so that a breathing tube can be inserted.  Extracorporeal membrane oxygenation (ECMO). This procedure gives the lungs a chance to recover by taking over the functions of the heart and lungs. It supplies oxygen to the body and removes carbon dioxide. Follow these instructions at home: Lifestyle  If you are sick, stay home except to get medical care. Your health care provider will tell you how long to stay home. Call your health care provider before you go for medical care.  Rest at home as told by your health care provider.  Do not use any products that contain nicotine or tobacco, such as cigarettes, e-cigarettes, and  chewing tobacco. If you need help quitting, ask your health care provider.  Return to your normal activities as told by your health care provider. Ask your health care provider what activities are safe for you. General instructions  Take over-the-counter and prescription medicines only as told by your health care provider.  Drink enough fluid to keep your urine pale yellow.  Keep all follow-up visits as told by your health care provider. This is important. How is this prevented?  There is no vaccine to help prevent COVID-19 infection. However, there are steps you can take to protect yourself and others from this virus. To protect yourself:   Do not travel to areas where COVID-19 is a risk. The areas where COVID-19 is reported change often. To identify high-risk areas and travel restrictions, check the CDC travel website: wwwnc.cdc.gov/travel/notices  If you live in, or must travel to, an area where COVID-19 is a risk, take precautions to avoid infection. ? Stay away from people who are sick. ? Wash your hands often with soap and water for 20 seconds. If soap and water   are not available, use an alcohol-based hand sanitizer. ? Avoid touching your mouth, face, eyes, or nose. ? Avoid going out in public, follow guidance from your state and local health authorities. ? If you must go out in public, wear a cloth face covering or face mask. Make sure your mask covers your nose and mouth. ? Avoid crowded indoor spaces. Stay at least 6 feet (2 meters) away from others. ? Disinfect objects and surfaces that are frequently touched every day. This may include:  Counters and tables.  Doorknobs and light switches.  Sinks and faucets.  Electronics, such as phones, remote controls, keyboards, computers, and tablets. To protect others: If you have symptoms of COVID-19, take steps to prevent the virus from spreading to others.  If you think you have a COVID-19 infection, contact your health care  provider right away. Tell your health care team that you think you may have a COVID-19 infection.  Stay home. Leave your house only to seek medical care. Do not use public transport.  Do not travel while you are sick.  Wash your hands often with soap and water for 20 seconds. If soap and water are not available, use alcohol-based hand sanitizer.  Stay away from other members of your household. Let healthy household members care for children and pets, if possible. If you have to care for children or pets, wash your hands often and wear a mask. If possible, stay in your own room, separate from others. Use a different bathroom.  Make sure that all people in your household wash their hands well and often.  Cough or sneeze into a tissue or your sleeve or elbow. Do not cough or sneeze into your hand or into the air.  Wear a cloth face covering or face mask. Make sure your mask covers your nose and mouth. Where to find more information  Centers for Disease Control and Prevention: www.cdc.gov/coronavirus/2019-ncov/index.html  World Health Organization: www.who.int/health-topics/coronavirus Contact a health care provider if:  You live in or have traveled to an area where COVID-19 is a risk and you have symptoms of the infection.  You have had contact with someone who has COVID-19 and you have symptoms of the infection. Get help right away if:  You have trouble breathing.  You have pain or pressure in your chest.  You have confusion.  You have bluish lips and fingernails.  You have difficulty waking from sleep.  You have symptoms that get worse. These symptoms may represent a serious problem that is an emergency. Do not wait to see if the symptoms will go away. Get medical help right away. Call your local emergency services (911 in the [place] Do not drive yourself to the hospital. Let the emergency medical personnel know if you think you have COVID-19. Summary  COVID-19 is a  respiratory infection that is caused by a virus. It is also known as coronavirus disease or novel coronavirus. It can cause serious infections, such as pneumonia, acute respiratory distress syndrome, acute respiratory failure, or sepsis.  The virus that causes COVID-19 is contagious. This means that it can spread from person to person through droplets from breathing, speaking, singing, coughing, or sneezing.  You are more likely to develop a serious illness if you are 50 years of age or older, have a weak immune system, live in a nursing home, or have chronic disease.  There is no medicine to treat COVID-19. Your health care provider will talk with you about ways to treat your symptoms.    Take steps to protect yourself and others from infection. Wash your hands often and disinfect objects and surfaces that are frequently touched every day. Stay away from people who are sick and wear a mask if you are sick. This information is not intended to replace advice given to you by your health care provider. Make sure you discuss any questions you have with your health care provider. Document Revised: 09/27/2019 Document Reviewed: 01/03/2019 Elsevier Patient Education  2020 [employer]  

## 2020-09-29 NOTE — Progress Notes (Addendum)
Nutrition Follow-up  RD working remotely.  DOCUMENTATION CODES:   Obesity unspecified  INTERVENTION:   -Increase 30 ml Prosource Plus to TID, each supplement provides 100 kcals and 15 grams protein -D/c Ensure Enlive po TID, each supplement provides 350 kcal and 20 grams of protein, due to poor acceptance -Magic cup TID with meals, each supplement provides 290 kcal and 9 grams of protein -MVI with minerals daily  NUTRITION DIAGNOSIS:   Increased nutrient needs related to acute illness (COVID PNA) as evidenced by estimated needs.  Ongoing  GOAL:   Patient will meet greater than or equal to 90% of their needs  Progressing  MONITOR:   PO intake, Supplement acceptance, Weight trends, Labs, I & O's  REASON FOR ASSESSMENT:   Rounds    ASSESSMENT:   Patient with PMH significant for bipolar disorder and GERD. Presents this admission with COVID PNA.  Reviewed I/O's: +175 ml x 24 hours and +2.9 L since 09/25/20  Attempted to speak with pt via call to hospital room phone, however, unable to reach.   Per MD notes, pt's cough and SOB are improving. Noted decreased appetite, with meal completion 25-100% (average intake for the past 3 days 25-50%). Pt is refusing Ensure Enlive supplements, but taking Prosource Plus.  Wt has been stable since admission.   Medications reviewed and include miralax and prednisone.   Per TOC notes, plan to d/c home with pt daughter once medically stable with home health services. Pt will require home oxygen.  Labs reviewed: CBGS: [id number] (inpatient orders for glycemic control are 0-9 units insulin aspart TID with meals).   Diet Order:   Diet Order            Diet general           Diet regular Room service appropriate? Yes; Fluid consistency: Thin  Diet effective now                 EDUCATION NEEDS:   Not appropriate for education at this time  Skin:  Skin Assessment: Reviewed RN Assessment  Last BM:  09/28/20  Height:   Ht  Readings from Last 1 Encounters:  08/29/20 5\' 4"  (1.626 m)    Weight:   Wt Readings from Last 1 Encounters:  09/25/20 103.3 kg   BMI:  Body mass index is 39.09 kg/m.  Estimated Nutritional Needs:   Kcal:  2000-2200  Protein:  120-135 grams  Fluid:  > 2 L    Loistine Chance, RD, LDN, [place] Registered Dietitian II Certified Diabetes Care and Education Specialist Please refer to [facility] for RD and/or RD on-call/weekend/after hours pager

## 2020-09-29 NOTE — Progress Notes (Signed)
Physical Therapy Treatment Patient Details Name: Wendy Ruiz MRN: [id number] DOB: 1956-12-06 Today's Date: 09/29/2020    History of Present Illness Pt is a 64 y.o. female admitted 08/29/20 not feeling well, apparently had syncopal episode and woke up to EMS present for hospital transport. CXR with bilateral infiltrates; pt hypoxic requiring NRB. PMH includes Bipolar 1, chronic pain, obesity.    PT Comments    Patient progressing towards physical therapy goals. Upon arrival, patient on 4.5L O2 [name] with spO2 93% at rest. With standing marching, patient spO2 dropped to 73%, with 4-5 minute seated rest break spO2 increased to 90%. Patient ambulated 96' with min guard, spO2 dropped to 71% upon sitting. With prolonged seated rest break, patient spO2 increased to 92% on 4.5L O2 [name]. Educated patient on energy conservation techniques for returning home. Patient continues to be limited by decreased activity tolerance, cardiopulmonary status, impaired functional mobility, and generalized weakness. Continue to recommend HHPT following discharge to maximize functional mobility. PT will continue to follow.     Follow Up Recommendations  Home health PT;Supervision for mobility/OOB     Equipment Recommendations  3in1 (PT);Other (comment) (rollator)    Recommendations for Other Services       Precautions / Restrictions Precautions Precautions: Fall;Other (comment) Precaution Comments: watch spO2  Restrictions Weight Bearing Restrictions: No    Mobility  Bed Mobility                  Transfers Overall transfer level: Needs assistance Equipment used: None Transfers: Sit to/from Stand Sit to Stand: Supervision            Ambulation/Gait Ambulation/Gait assistance: Min guard Gait Distance (Feet): 15 Feet Assistive device: None Gait Pattern/deviations: Step-through pattern;Decreased stride length Gait velocity: decreased       Stairs             Wheelchair  Mobility    Modified Rankin (Stroke Patients Only)       Balance Overall balance assessment: Needs assistance Sitting-balance support: No upper extremity supported;Feet supported Sitting balance-Leahy Scale: Good     Standing balance support: No upper extremity supported;During functional activity Standing balance-Leahy Scale: Fair                              Cognition Arousal/Alertness: Awake/alert Behavior During Therapy: WFL for tasks assessed/performed Overall Cognitive Status: Within Functional Limits for tasks assessed                                 General Comments: anxiety with SOB and O2 desaturation with mobility      Exercises General Exercises - Lower Extremity Hip Flexion/Marching: Both;10 reps;Standing    General Comments General comments (skin integrity, edema, etc.): With standing marching, spO2 dropped to 73% on 4.5L O2 [name], 4-5 minute rest break with increase to 90%. With 15 ft ambulation, spO2 dropped to 71% with 4-5 minute seated rest break spO2 increased to 92%. Patient with increased anxiety this session due to potentially going home later today. With anxiety, patient experiences increased SOB       Pertinent Vitals/Pain Pain Assessment: No/denies pain    Home Living                      Prior Function            PT Goals (current goals can now  be found in the care plan section) Acute Rehab PT Goals Patient Stated Goal: to go home safely PT Goal Formulation: With patient Time For Goal Achievement: 10/07/20 Potential to Achieve Goals: Good Progress towards PT goals: Progressing toward goals    Frequency    Min 3X/week      PT Plan Current plan remains appropriate    Co-evaluation              AM-PAC PT "6 Clicks" Mobility   Outcome Measure  Help needed turning from your back to your side while in a flat bed without using bedrails?: None Help needed moving from lying on your back to  sitting on the side of a flat bed without using bedrails?: None Help needed moving to and from a bed to a chair (including a wheelchair)?: None Help needed standing up from a chair using your arms (e.g., wheelchair or bedside chair)?: None Help needed to walk in hospital room?: A Little Help needed climbing 3-5 steps with a railing? : A Lot 6 Click Score: 21    End of Session Equipment Utilized During Treatment: Oxygen (4.5L [name]) Activity Tolerance: Patient tolerated treatment well Patient left: in chair;with call bell/phone within reach Nurse Communication: Mobility status PT Visit Diagnosis: Other abnormalities of gait and mobility (R26.89)     Time: 1308-6578 PT Time Calculation (min) (ACUTE ONLY): 32 min  Charges:  $Therapeutic Activity: 23-37 mins                     Wendy Ruiz, PT, DPT Acute Rehabilitation Services Pager [phone] Office [phone]    Wendy Ruiz 09/29/2020, 11:03 AM

## 2020-09-29 NOTE — TOC Progression Note (Addendum)
Transition of Care [facility]) - Progression Note    Patient Details  Name: Wendy Ruiz MRN: [id number] Date of Birth: 20-Oct-1956  [facility]) CM/SW Contact  Jacalyn Lefevre Edson Snowball, RN Phone Number: 09/29/2020, 1:00 PM  Clinical Narrative:     [employer] working on DME. NCM just received and submitted oxygen saturation note.   TOC Pharmacy filling scripts through [facility].   Spoke to Amy with [facility], office director is reviewing clinicals. Amy will call NCM when a determination is made.   Patient had told NCM that she was active with [facility] , however, last time she was there was 2016. Discussed with Wendy Ruiz and patient. They are agreeable to [facility] , scheduled appointment for October 14, 2020 at 2:10 with Freeman Caldron. Amy with [facility] aware. Will ask Dr Rockne Menghini if she will be able to sign HH orders in meantime.   Called Wendy Ruiz with appointment time and information. Expected Discharge Plan: [place] Barriers to Discharge: Inadequate or no insurance  Expected Discharge Plan and Services Expected Discharge Plan: [place] In-house Referral: Development worker, community, Clinical Social Work Discharge Planning Services: CM Consult Post Acute Care Choice: Home Health, Durable Medical Equipment Living arrangements for the past 2 months: [place] Expected Discharge Date: 09/29/20               DME Arranged: 3-N-1, Walker rolling with seat, Oxygen DME Agency: [facility] Date DME Agency Contacted: 09/28/20 Time DME Agency Contacted: [phone] Representative spoke with at DME Agency: [place]: PT, OT [place] Agency: [place] ([place]) Date [place]: 09/28/20 Time [place]: [place] Representative spoke with at [place]: [place] ([place]) Interventions    Readmission Risk Interventions No flowsheet data found.

## 2020-09-29 NOTE — Discharge Summary (Addendum)
[facility]  Physician Discharge Summary   Patient ID: Wendy Ruiz MRN: [id number] DOB/AGE: 03-14-1956 64 y.o.  Admit date: 08/29/2020 Discharge date: 09/29/2020  PCP: Lyndee Hensen, DO  DISCHARGE DIAGNOSES:  Pneumonia due to COVID-19 Acute respiratory failure with hypoxia Normocytic anemia Transaminitis Indeterminate QuantiFERON  RECOMMENDATIONS FOR OUTPATIENT FOLLOW UP: 1. Home oxygen has been ordered 2. Home health has been ordered 3. Patient will need to have LFTs checked in 1 to 2 weeks 4. Ambulatory referral sent to pulmonology.    Home Health: PT and OT Equipment/Devices: Home oxygen  CODE STATUS: Full code  DISCHARGE CONDITION: fair  Diet recommendation: Regular as tolerated  INITIAL HISTORY: 64yo with a history of bipolar disorder, obesity, and GERD who was admitted to the hospital 9/18 with severe acute hypoxic respiratory failure and diagnosed with Covid pneumonia. The patient was not vaccinated against Covid. The patient was under the care of PCCM in the ICU and BiPAP dependent for a significant portion of the hospital stay. She was transferred to the hospital service 9/29. At that time she was requiring 50 L of heated high flow nasal cannula support with additional nonrebreather mask support.  Significant Events:  9/18 admit via [facility]  9/19 CTa chest no PE -near confluent dense groundglass opacities all lobes bilaterally 9/21 TTE unrevealing 9/23 bilateral lower extremity venous duplex negative 9/29 [facility] assumed care from [facility]  10/2 bilateral lower extremity venous duplex negative 10/3 CTa chest without PE  Date of Positive COVID Test:  9/19  Date Isolation Ends: 10/9  Vaccination Status: Not vaccinated against Covid  COVID-19 specific Treatment: Baricitinib 9/19 > 10/2 Steroid 9/18 > Remdesivir 9/19 > 9/23  Antimicrobials:  Cefepime 9/23 > 9/27 Levaquin 9/29 > 10/2   HOSPITAL COURSE:   Acute  respiratory failure with hypoxia/pneumonia due to COVID-19  Patient completed a course of remdesivir and baricitinib.  She has been on steroids since admission.  She has had a very slow recovery process.  She was initially requiring heated high flow oxygen up to 55 L/min.  Was briefly on BiPAP as well. O2 Requirements have been slowly decreasing over the past 2 weeks.  She is now down to nasal cannula at 4 Lpm.  She does tend to desaturate with ambulation but recovers quickly.  She feels stronger.  She will be continued on steroids for few more days.  Lasix for a few more days.  She was kept in negative fluid balance during this hospital stay.  Antitussive agents will also be prescribed. Home health will be ordered.  Home oxygen will be ordered.  Patient looking forward to going home.  Sinus tachycardia TSH normal.  Echocardiogram did not show any significant abnormalities except for elevated pulmonary artery systolic pressure.  Tachycardia seems to be stable.    Continue metoprolol.  Elevated D-dimer Likely secondary to COVID-19.  CTA x2 and venous duplex lower extremities x2 negative for thrombus.   Normocytic anemia Hemoglobin has been stable.  No evidence of overt blood loss.    Transaminitis Likely secondary to COVID-19. It is gradually improving.    Needs to be checked in 1 to 2 weeks.  Indeterminate QuantiFERON Obtained while in ICU. No evidence on CTA chest of active tuberculosis.  GERD Continue PPI  Obesity Estimated body mass index is 39.09 kg/m as calculated from the following:   Height as of this encounter: 5\' 4"  (1.626 m).   Weight as of this encounter: 103.3 kg.  Overall stable.  Okay for  discharge home once home oxygen and home health has been arranged.   PERTINENT LABS:  The results of significant diagnostics from this hospitalization (including imaging, microbiology, ancillary and laboratory) are listed below for reference.      Labs:    Basic Metabolic  Panel: Recent Labs  Lab 09/25/20 0144 09/26/20 0045 09/27/20 0209 09/28/20 0236 09/29/20 0218  NA 136 136 135 136 136  K 4.0 3.5 4.4 3.7 3.5  CL 92* 92* 92* 92* 92*  CO2 33* 32 30 31 32  GLUCOSE 127* 127* 165* 123* 127*  BUN 10 11 15 17 13   CREATININE 0.63 0.71 0.79 0.83 0.76  CALCIUM 9.1 9.4 9.9 9.3 9.1  MG  --   --  2.1  --   --    Liver Function Tests: Recent Labs  Lab 09/24/20 0906 09/25/20 0144 09/26/20 0045 09/29/20 0218  AST 51* 45* 44* 66*  ALT 135* 132* 130* 159*  ALKPHOS 58 54 60 55  BILITOT 0.6 0.6 0.8 0.7  PROT 6.3* 6.4* 6.7 6.0*  ALBUMIN 2.8* 2.9* 3.0* 2.8*   CBC: Recent Labs  Lab 09/24/20 0906  WBC 13.0*  HGB 10.3*  HCT 34.8*  MCV 89.2  PLT 201   BNP: BNP (last 3 results) Recent Labs    09/15/20 0408 09/16/20 0630 09/20/20 0500  BNP 45.7 26.5 28.3     CBG: Recent Labs  Lab 09/28/20 0751 09/28/20 1202 09/28/20 1710 09/28/20 2029 09/29/20 0812  GLUCAP 105* 383* 292* 193* 140*     IMAGING STUDIES CT ANGIO CHEST PE W OR WO CONTRAST  Result Date: 09/13/2020 CLINICAL DATA:  PE suspected COVID+ pt very SOB, rapid breathing during the scan EXAM: CT ANGIOGRAPHY CHEST WITH CONTRAST TECHNIQUE: Multidetector CT imaging of the chest was performed using the standard protocol during bolus administration of intravenous contrast. Multiplanar CT image reconstructions and MIPs were obtained to evaluate the vascular anatomy. CONTRAST:  74mL OMNIPAQUE IOHEXOL 350 MG/ML SOLN COMPARISON:  CT chest 08/30/2020 FINDINGS: Cardiovascular: Suboptimal opacification of the pulmonary arterial system. There are also extensive bilateral pulmonary opacities limiting evaluation beyond the segmental level. Within these limitations there are no filling defects. Normal caliber of the main pulmonary artery and thoracic aorta. Atherosclerotic calcification in the aortic arch. Mildly enlarged heart size. No pericardial effusion. Mediastinum/Nodes: There are prominent  mediastinal and hilar lymph nodes similar to prior, likely reactive. Representative lymph node at the AP window measures 0.8 cm in short axis (series 5, image 31). No axillary adenopathy. Lungs/Pleura: Central airways are patent. There are extensive bilateral ground-glass and consolidative opacities involving the entirety of both lungs which are increased compared to the prior study. There is a pneumatocele in the right lung measuring 2.0 cm (series 5, image 34). No pneumothorax or pleural effusion. Upper Abdomen: Multiple gallstones.  Otherwise unremarkable Musculoskeletal: No chest wall abnormality. No acute or significant osseous findings. Review of the MIP images confirms the above findings. IMPRESSION: 1. Suboptimal opacification of the pulmonary arterial system and extensive bilateral pulmonary opacities limiting evaluation. No evidence of thrombus to the level of the subsegmental pulmonary arteries. 2. Extensive bilateral ground-glass and consolidative opacities involving the entirety of both lungs which are increased compared to the prior study, consistent with history of COVID pneumonia. 3. Prominent mediastinal and hilar lymph nodes, similar to prior, likely reactive. 4. Cholelithiasis. 5. Aortic atherosclerosis. Aortic Atherosclerosis (ICD10-I70.0). Electronically Signed   By: Audie Pinto M.D.   On: 09/13/2020 13:59   DG Chest [facility]  Result Date: 09/26/2020 CLINICAL DATA:  COVID-19 positive with pneumonia EXAM: PORTABLE CHEST 1 VIEW COMPARISON:  September 23, 2020 FINDINGS: Ill-defined airspace opacity persists in the lungs bilaterally. No consolidation. Heart is upper normal in size with pulmonary vascularity normal. No adenopathy. No bone lesions. There is aortic atherosclerosis. IMPRESSION: Ill-defined airspace opacity bilaterally consistent with multifocal pneumonia, likely of atypical organism etiology. No consolidation. Stable cardiac silhouette. No evident adenopathy. Aortic  Atherosclerosis (ICD10-I70.0). Electronically Signed   By: Lowella Grip III M.D.   On: 09/26/2020 11:04   DG Chest Port 1 View  Result Date: 09/23/2020 CLINICAL DATA:  Pneumonia due to COVID-19. EXAM: PORTABLE CHEST 1 VIEW COMPARISON:  September 15, 2020. FINDINGS: The heart size and mediastinal contours are within normal limits. No pneumothorax or pleural effusion is noted. Stable bilateral diffuse interstitial and airspace opacities are noted concerning for multifocal pneumonia. The visualized skeletal structures are unremarkable. IMPRESSION: Stable bilateral diffuse interstitial and airspace opacities are noted concerning for multifocal pneumonia. Electronically Signed   By: Marijo Conception M.D.   On: 09/23/2020 08:20   DG Chest Port 1 View  Result Date: 09/15/2020 CLINICAL DATA:  Shortness of breath, COVID positive EXAM: PORTABLE CHEST 1 VIEW COMPARISON:  Chest radiograph dated 09/09/2020 and CT chest dated 09/13/2020. FINDINGS: The heart is borderline enlarged, unchanged. Moderate diffuse bilateral interstitial and airspace opacities have decreased since prior exam. There is no pleural effusion or pneumothorax. The osseous structures are unremarkable. IMPRESSION: Moderate diffuse bilateral interstitial and airspace opacities have decreased since prior exam. Electronically Signed   By: Zerita Boers M.D.   On: 09/15/2020 09:00   DG Chest Port 1 View  Result Date: 09/09/2020 CLINICAL DATA:  COVID positive, shortness of breath EXAM: PORTABLE CHEST 1 VIEW COMPARISON:  09/03/2020 FINDINGS: Severe diffuse bilateral airspace disease, worsening since prior study. Heart is borderline in size. No visible effusions or pneumothorax. IMPRESSION: Severe diffuse bilateral airspace disease, worsening since prior study. Electronically Signed   By: Rolm Baptise M.D.   On: 09/09/2020 09:21   DG CHEST PORT 1 VIEW  Result Date: 09/03/2020 CLINICAL DATA:  Adult respiratory distress syndrome. Reported COVID-19  positive EXAM: PORTABLE CHEST 1 VIEW COMPARISON:  August 29, 2020 chest radiograph and chest CT August 30, 2020 FINDINGS: There is increased airspace opacity throughout the lungs bilaterally with a more even distribution of airspace opacity compared to prior studies. No consolidation. Heart is mildly enlarged with pulmonary vascularity normal. No adenopathy. No bone lesions. IMPRESSION: Persistent widespread airspace opacity bilaterally without areas of consolidation. Appearance consistent with multifocal atypical organism pneumonia. A degree of underlying ARDS may be present. Stable cardiac prominence. No adenopathy evident by radiography. Electronically Signed   By: Lowella Grip III M.D.   On: 09/03/2020 07:59   VAS [place] LOWER EXTREMITY VENOUS (DVT)  Result Date: 09/13/2020  Lower Venous DVT Study Indications: Covid-19, D-Dimer.  Comparison Study: Prior negative study from 09/03/20 is available for comparison Performing Technologist: Sharion Dove RVS  Examination Guidelines: A complete evaluation includes B-mode imaging, spectral Doppler, color Doppler, and power Doppler as needed of all accessible portions of each vessel. Bilateral testing is considered an integral part of a complete examination. Limited examinations for reoccurring indications may be performed as noted. The reflux portion of the exam is performed with the patient in reverse Trendelenburg.  +---------+---------------+---------+-----------+----------+--------------+ RIGHT    CompressibilityPhasicitySpontaneityPropertiesThrombus Aging +---------+---------------+---------+-----------+----------+--------------+ CFV      Full           Yes  Yes                                 +---------+---------------+---------+-----------+----------+--------------+ SFJ      Full                                                        +---------+---------------+---------+-----------+----------+--------------+ FV Prox  Full                                                         +---------+---------------+---------+-----------+----------+--------------+ FV Mid   Full                                                        +---------+---------------+---------+-----------+----------+--------------+ FV DistalFull                                                        +---------+---------------+---------+-----------+----------+--------------+ PFV      Full                                                        +---------+---------------+---------+-----------+----------+--------------+ POP      Full           Yes      Yes                                 +---------+---------------+---------+-----------+----------+--------------+ PTV      Full                                                        +---------+---------------+---------+-----------+----------+--------------+ PERO     Full                                                        +---------+---------------+---------+-----------+----------+--------------+   +---------+---------------+---------+-----------+----------+--------------+ LEFT     CompressibilityPhasicitySpontaneityPropertiesThrombus Aging +---------+---------------+---------+-----------+----------+--------------+ CFV      Full           Yes      Yes                                 +---------+---------------+---------+-----------+----------+--------------+ SFJ      Full                                                        +---------+---------------+---------+-----------+----------+--------------+  FV Prox  Full                                                        +---------+---------------+---------+-----------+----------+--------------+ FV Mid   Full                                                        +---------+---------------+---------+-----------+----------+--------------+ FV DistalFull                                                         +---------+---------------+---------+-----------+----------+--------------+ PFV      Full                                                        +---------+---------------+---------+-----------+----------+--------------+ POP      Full           Yes      Yes                                 +---------+---------------+---------+-----------+----------+--------------+ PTV      Full                                                        +---------+---------------+---------+-----------+----------+--------------+ PERO     Full                                                        +---------+---------------+---------+-----------+----------+--------------+     Summary: BILATERAL: - No evidence of deep vein thrombosis seen in the lower extremities, bilaterally. - RIGHT: - Findings appear essentially unchanged compared to previous examination.  LEFT: - Findings appear essentially unchanged compared to previous examination.  *See table(s) above for measurements and observations. Electronically signed by Harold Barban MD on 09/13/2020 at 7:43:20 PM.    Final    VAS [place] LOWER EXTREMITY VENOUS (DVT)  Result Date: 09/03/2020  Lower Venous DVTStudy Indications: Edema.  Limitations: Patient positioning. Comparison Study: no prior Performing Technologist: Abram Sander RVS  Examination Guidelines: A complete evaluation includes B-mode imaging, spectral Doppler, color Doppler, and power Doppler as needed of all accessible portions of each vessel. Bilateral testing is considered an integral part of a complete examination. Limited examinations for reoccurring indications may be performed as noted. The reflux portion of the exam is performed with the patient in reverse Trendelenburg.  +---------+---------------+---------+-----------+----------+--------------+ RIGHT    CompressibilityPhasicitySpontaneityPropertiesThrombus Aging  +---------+---------------+---------+-----------+----------+--------------+ CFV      Full  Yes      Yes                                 +---------+---------------+---------+-----------+----------+--------------+ SFJ      Full                                                        +---------+---------------+---------+-----------+----------+--------------+ FV Prox  Full                                                        +---------+---------------+---------+-----------+----------+--------------+ FV Mid   Full                                                        +---------+---------------+---------+-----------+----------+--------------+ FV DistalFull                                                        +---------+---------------+---------+-----------+----------+--------------+ PFV      Full                                                        +---------+---------------+---------+-----------+----------+--------------+ POP      Full           Yes      Yes                                 +---------+---------------+---------+-----------+----------+--------------+ PTV      Full                                                        +---------+---------------+---------+-----------+----------+--------------+ PERO     Full                                                        +---------+---------------+---------+-----------+----------+--------------+   +---------+---------------+---------+-----------+----------+--------------+ LEFT     CompressibilityPhasicitySpontaneityPropertiesThrombus Aging +---------+---------------+---------+-----------+----------+--------------+ CFV      Full           Yes      Yes                                 +---------+---------------+---------+-----------+----------+--------------+ SFJ  Full                                                         +---------+---------------+---------+-----------+----------+--------------+ FV Prox  Full                                                        +---------+---------------+---------+-----------+----------+--------------+ FV Mid   Full                                                        +---------+---------------+---------+-----------+----------+--------------+ FV DistalFull                                                        +---------+---------------+---------+-----------+----------+--------------+ PFV      Full                                                        +---------+---------------+---------+-----------+----------+--------------+ POP      Full           Yes      Yes                                 +---------+---------------+---------+-----------+----------+--------------+ PTV      Full                                                        +---------+---------------+---------+-----------+----------+--------------+ PERO                                                  Not visualized +---------+---------------+---------+-----------+----------+--------------+     Summary: BILATERAL: - No evidence of deep vein thrombosis seen in the lower extremities, bilaterally. - No evidence of superficial venous thrombosis in the lower extremities, bilaterally. -   *See table(s) above for measurements and observations. Electronically signed by Servando Snare MD on 09/03/2020 at 7:12:00 PM.    Final    ECHOCARDIOGRAM LIMITED  Result Date: 09/02/2020    ECHOCARDIOGRAM LIMITED REPORT   Patient Name:   HONORE WIPPERFURTH Date of Exam: 09/01/2020 Medical Rec #:  [id number]           Height:       64.0 in Accession #:    [id number]          Weight:  255.3 lb Date of Birth:  07/27/1956           BSA:          2.170 m Patient Age:    31 years            BP:           122/79 mmHg Patient Gender: F                   HR:           97 bpm. Exam Location:  Inpatient  Procedure: 2D Echo                                 MODIFIED REPORT: This report was modified by Sanda Klein MD on 09/02/2020 due to Corrected IVC                                    statement.  Indications:     786.09 dyspnea  History:         Patient has no prior history of Echocardiogram examinations.                  Risk Factors:Former Smoker. Covid +.  Sonographer:     Jannett Celestine RDCS (AE) Referring Phys:  [id number] Lanier Clam Diagnosing Phys: Sanda Klein MD IMPRESSIONS  1. Left ventricular ejection fraction, by estimation, is 60 to 65%. The left ventricle has normal function. The left ventricle has no regional wall motion abnormalities. Left ventricular diastolic parameters were normal.  2. Right ventricular systolic function is normal. The right ventricular size is normal. There is severely elevated pulmonary artery systolic pressure.  3. The mitral valve is normal in structure. No evidence of mitral valve regurgitation. No evidence of mitral stenosis.  4. The aortic valve is normal in structure. Aortic valve regurgitation is not visualized. No aortic stenosis is present.  5. The inferior vena cava is dilated in size with <50% respiratory variability, suggesting right atrial pressure of 15 mmHg. FINDINGS  Left Ventricle: Left ventricular ejection fraction, by estimation, is 60 to 65%. The left ventricle has normal function. The left ventricle has no regional wall motion abnormalities. The left ventricular internal cavity size was normal in size. There is  no left ventricular hypertrophy. Left ventricular diastolic parameters were normal. Right Ventricle: The right ventricular size is normal. No increase in right ventricular wall thickness. Right ventricular systolic function is normal. There is severely elevated pulmonary artery systolic pressure. The tricuspid regurgitant velocity is 3.43 m/s, and with an assumed right atrial pressure of 15 mmHg, the estimated right ventricular systolic pressure  is 76.7 mmHg. Left Atrium: Left atrial size was normal in size. Right Atrium: Right atrial size was normal in size. Pericardium: There is no evidence of pericardial effusion. Mitral Valve: The mitral valve is normal in structure. No evidence of mitral valve stenosis. Tricuspid Valve: The tricuspid valve is normal in structure. Tricuspid valve regurgitation is trivial. No evidence of tricuspid stenosis. Aortic Valve: The aortic valve is normal in structure. Aortic valve regurgitation is not visualized. No aortic stenosis is present. Pulmonic Valve: The pulmonic valve was normal in structure. Pulmonic valve regurgitation is not visualized. No evidence of pulmonic stenosis. Aorta: The aortic root is normal in size and structure. Venous: The inferior vena cava is dilated in size  with less than 50% respiratory variability, suggesting right atrial pressure of 15 mmHg. IAS/Shunts: No atrial level shunt detected by color flow Doppler. LEFT VENTRICLE PLAX 2D LVIDd:         4.25 cm Diastology LVIDs:         3.06 cm LV e' medial:    8.38 cm/s LV PW:         0.96 cm LV E/e' medial:  8.9 LV IVS:        0.93 cm LV e' lateral:   12.40 cm/s                        LV E/e' lateral: 6.0  MITRAL VALVE               TRICUSPID VALVE MV Area (PHT): 4.26 cm    TR Peak grad:   47.1 mmHg MV Decel Time: 178 msec    TR Vmax:        343.00 cm/s MV E velocity: 74.55 cm/s MV A velocity: 68.98 cm/s MV E/A ratio:  1.08 Mihai Croitoru MD Electronically signed by Sanda Klein MD Signature Date/Time: 09/01/2020/3:40:50 PM    Final (Updated)     DISCHARGE EXAMINATION: Vitals:   09/28/20 2031 09/28/20 2106 09/29/20 0838 09/29/20 0849  BP: 103/66  105/63   Pulse: 85  85   Resp: 18  18   Temp: 98.4 F (36.9 C)  98.1 F (36.7 C)   TempSrc:   Oral   SpO2: 97% 97%  93%  Weight:      Height:       General appearance: Awake alert.  In no distress Resp: Normal effort at rest.  Few crackles bilateral bases.  No wheezing or rhonchi. Cardio:  S1-S2 is normal regular.  No S3-S4.  No rubs murmurs or bruit GI: Abdomen is soft.  Nontender nondistended.  Bowel sounds are present normal.  No masses organomegaly    DISPOSITION: Home with home health  Discharge Instructions    Ambulatory referral to Pulmonology   Complete by: As directed    In 1-2 weeks for Covid pna. Discharged on home o2. Off of isolation.   Reason for referral: Other   Call MD for:  difficulty breathing, headache or visual disturbances   Complete by: As directed    Call MD for:  extreme fatigue   Complete by: As directed    Call MD for:  persistant dizziness or light-headedness   Complete by: As directed    Call MD for:  persistant nausea and vomiting   Complete by: As directed    Call MD for:  severe uncontrolled pain   Complete by: As directed    Call MD for:  temperature >100.4   Complete by: As directed    Diet general   Complete by: As directed    Discharge instructions   Complete by: As directed    Please take your medications as prescribed.  Seek attention immediately if your symptoms were to worsen.  Continue with incentive spirometry at home.  Keep yourself active without exerting himself too much.  Follow-up with outpatient provider in 1 to 2 weeks.  You were cared for by a hospitalist during your hospital stay. If you have any questions about your discharge medications or the care you received while you were in the hospital after you are discharged, you can call the unit and asked to speak with the hospitalist on call if the hospitalist that took  care of you is not available. Once you are discharged, your primary care physician will handle any further medical issues. Please note that NO REFILLS for any discharge medications will be authorized once you are discharged, as it is imperative that you return to your primary care physician (or establish a relationship with a primary care physician if you do not have one) for your aftercare needs so that they  can reassess your need for medications and monitor your lab values. If you do not have a primary care physician, you can call [phone] for a physician referral.   Increase activity slowly   Complete by: As directed         Allergies as of 09/29/2020      Reactions   Penicillins Rash      Medication List    TAKE these medications   benzonatate 200 MG capsule Commonly known as: TESSALON Take 1 capsule (200 mg total) by mouth 3 (three) times daily.   chlorpheniramine-HYDROcodone 10-8 MG/5ML Suer Commonly known as: TUSSIONEX Take 5 mLs by mouth every 12 (twelve) hours as needed (breakthrough cough).   furosemide 20 MG tablet Commonly known as: LASIX Take 1 tablet (20 mg total) by mouth daily for 7 days. Start taking on: September 30, 2020   metoprolol tartrate 25 MG tablet Commonly known as: LOPRESSOR Take 0.5 tablets (12.5 mg total) by mouth 2 (two) times daily.   mometasone-formoterol 200-5 MCG/ACT Aero Commonly known as: DULERA Inhale 2 puffs into the lungs 2 (two) times daily.   pantoprazole 40 MG tablet Commonly known as: PROTONIX Take 1 tablet (40 mg total) by mouth daily at 12 noon for 21 days. Start taking on: September 30, 2020   polyethylene glycol 17 g packet Commonly known as: MIRALAX / GLYCOLAX Take 17 g by mouth daily as needed.   Potassium Chloride ER 20 MEQ Tbcr Take 20 mEq by mouth daily for 7 days.   predniSONE 20 MG tablet Commonly known as: DELTASONE Take 2 tablets once daily for 5 days followed by 1 tablet once daily for 10 days and then stop            Durable Medical Equipment  (From admission, onward)         Start     Ordered   09/28/20 1600  For home use only DME 4 wheeled rolling walker with seat  Once       Question:  Patient needs a walker to treat with the following condition  Answer:  Weakness   09/28/20 1559   09/28/20 1600  For home use only DME 3 n 1  Once        09/28/20 1559   09/28/20 1027  For home use only DME oxygen   Once       Question Answer Comment  Length of Need 6 Months   Mode or (Route) Nasal cannula   Liters per Minute 4   Frequency Continuous (stationary and portable oxygen unit needed)   Oxygen conserving device Yes   Oxygen delivery system Gas      09/28/20 1027            Follow-up Information    Brimage, Vondra, DO. Schedule an appointment as soon as possible for a visit in 1 week(s).   Specialty: Family Medicine Contact information: [address] [place] [address] [phone]               TOTAL DISCHARGE TIME: 35 minutes  Bonnielee Haff  [employer] operational officer on [email or web address]  09/29/2020, 12:06 PM

## 2020-09-29 NOTE — Progress Notes (Signed)
SATURATION QUALIFICATIONS: (This note is used to comply with regulatory documentation for home oxygen)  Patient Saturations on Room Air at Rest = 73% on room air   Patient Saturations on Room Air while Ambulating =65% on room air   Patient Saturations on Liters of oxygen while Ambulating = 90% on 4.5L    Please briefly explain why patient needs home oxygen: Covid PNA

## 2020-09-29 NOTE — Progress Notes (Signed)
Occupational Therapy Treatment Patient Details Name: Wendy Ruiz MRN: [id number] DOB: [birth year] Today's Date: 09/29/2020    History of present illness 64 Y/O F with PMX of morbid obesity and bipolar presented with cough, fever, and SOB for two days after being exposed to a family member who tested positive for COVID-19. Patient found to have acute hypoxic respiratory failure 2/2 COVID-19, hyypercalcemia, and acute kidney disease.    OT comments  OT treatment session with focus on ADL transfers, energy conservation, home set-up, SpO2 monitoring, and safety with DME. Patient currently demonstrates Min guard to supervision A for toilet transfers to [facility] without use of AD. Patient also limited by DOE with activity, need for supplemental O2 (4.5L via [place]), generalized weakness, and decreased activity tolerance. Patient notes plan to d/c to her daughters home with family able to provide 24hr supervision/assist. Patient able to recall 3 energy conservation techniques this date including rest breaks between tasks, grooming in sitting, and LB dressing in sitting. Patient would benefit from continued acute OT services in prep for safety d/c home with continued recommendation for [place].    Follow Up Recommendations  Home health OT;Supervision/Assistance - 24 hour    Equipment Recommendations  3 in 1 bedside commode;Tub/shower bench    Recommendations for Other Services      Precautions / Restrictions Precautions Precautions: Fall;Other (comment) Precaution Comments: watch spO2  Restrictions Weight Bearing Restrictions: No       Mobility Bed Mobility                  Transfers Overall transfer level: Needs assistance Equipment used: None Transfers: Sit to/from Stand Sit to Stand: Supervision              Balance Overall balance assessment: Needs assistance Sitting-balance support: No upper extremity supported;Feet supported Sitting balance-Leahy Scale: Good      Standing balance support: No upper extremity supported;During functional activity Standing balance-Leahy Scale: Fair                             ADL either performed or assessed with clinical judgement   ADL Overall ADL's : Needs assistance/impaired Eating/Feeding: Independent;Sitting                       Toilet Transfer: Copy Details (indicate cue type and reason): Stand-pivot transfer from [facility] to recliner without AD.  Toileting- Clothing Manipulation and Hygiene: Supervision/safety;Sitting/lateral lean;Sit to/from stand Toileting - Clothing Manipulation Details (indicate cue type and reason): Patient reports completion of toileting without assist or AD. DOE noted with toileting task.              Vision       Perception     Praxis      Cognition Arousal/Alertness: Awake/alert Behavior During Therapy: WFL for tasks assessed/performed Overall Cognitive Status: Within Functional Limits for tasks assessed                                 General Comments: Patient with DOE with functional activity.        Exercises Exercises: General Lower Extremity General Exercises - Lower Extremity Hip Flexion/Marching: Both;10 reps;Standing   Shoulder Instructions       General Comments With standing marching, spO2 dropped to 73% on 4.5L O2 [place], 4-5 minute rest break with increase to 90%. With 15 ft ambulation,  spO2 dropped to 71% with 4-5 minute seated rest break spO2 increased to 92%. Patient with increased anxiety this session due to potentially going home later today. With anxiety, patient experiences increased SOB     Pertinent Vitals/ Pain       Pain Assessment: No/denies pain  Home Living                                          Prior Functioning/Environment              Frequency  Min 3X/week        Progress Toward Goals  OT Goals(current goals can now be found in the care  plan section)  Progress towards OT goals: Progressing toward goals  Acute Rehab OT Goals Patient Stated Goal: To get better OT Goal Formulation: With patient Time For Goal Achievement: 10/13/20 Potential to Achieve Goals: Good ADL Goals Pt Will Perform Grooming: with modified independence;standing;sitting Pt Will Perform Lower Body Dressing: with modified independence;sit to/from stand Pt Will Transfer to Toilet: with modified independence;ambulating;bedside commode Pt Will Perform Toileting - Clothing Manipulation and hygiene: with modified independence;sitting/lateral leans;sit to/from stand Additional ADL Goal #1: Pt will independently verbalize three energy conservation techniques for ADLs and IADLs Additional ADL Goal #2: Pt will independently monitor SpO2 and use purse lip breathing for ADLs  Plan Discharge plan remains appropriate;Frequency remains appropriate    Co-evaluation                 AM-PAC OT "6 Clicks" Daily Activity     Outcome Measure   Help from another person eating meals?: None Help from another person taking care of personal grooming?: A Little Help from another person toileting, which includes using toliet, bedpan, or urinal?: A Little Help from another person bathing (including washing, rinsing, drying)?: A Little Help from another person to put on and taking off regular upper body clothing?: A Little Help from another person to put on and taking off regular lower body clothing?: A Little 6 Click Score: 19    End of Session Equipment Utilized During Treatment: Oxygen (4.5L)  OT Visit Diagnosis: Unsteadiness on feet (R26.81);Other abnormalities of gait and mobility (R26.89);Muscle weakness (generalized) (M62.81)   Activity Tolerance Patient tolerated treatment well   Patient Left in chair;with call bell/phone within reach   Nurse Communication          Time: 4580-9983 OT Time Calculation (min): 23 min  Charges: OT General Charges $OT  Visit: 1 Visit OT Treatments $Self Care/Home Management : 8-22 mins $Therapeutic Activity: 8-22 mins  Mahalie Kanner H. OTR/L Supplemental OT, Department of rehab services [phone]  [place] Rosamond R H. 09/29/2020, 12:53 PM

## 2020-09-29 NOTE — TOC Progression Note (Signed)
Transition of Care [facility]) - Progression Note    Patient Details  Name: DELOROS BERETTA MRN: [id number] Date of Birth: May 05, 1956  Transition of Care [facility] CM/SW [place], RN Phone Number: 09/29/2020, 8:47 AM  Clinical Narrative:    Case management spoke with Kingsley Spittle, CM with [facility] and the agency declined services for PT/OT  for charity for the patient due to patient having available funds.  I called and spoke with Amy, RNCM with [facility] to request services for PT/OT.  [facility] will return call to determine if they can provide assistance.  Expected Discharge Plan: [place] Barriers to Discharge: Inadequate or no insurance  Expected Discharge Plan and Services Expected Discharge Plan: [place] In-house Referral: Development worker, community, Clinical Social Work Discharge Planning Services: CM Consult Post Acute Care Choice: Home Health, Durable Medical Equipment Living arrangements for the past 2 months: Single Family Home                 DME Arranged: 3-N-1, Walker rolling with seat, Oxygen DME Agency: [employer] Date DME Agency Contacted: 09/28/20 Time DME Agency Contacted: 0940 Representative spoke with at DME Agency: Joanna: PT, OT [place] Agency: [place] ([place]) Date [place]: 09/28/20 Time [place]: [place] Representative spoke with at [place]: [place] ([place]) Interventions    Readmission Risk Interventions No flowsheet data found.

## 2020-10-07 NOTE — Progress Notes (Signed)
Patient ID: Wendy Ruiz, female   DOB: 1956/02/01, 64 y.o.   MRN: [id number] Virtual Visit via Video Note  I connected with Audelia Hives on 10/14/20 at  2:10 PM EDT by a video enabled telemedicine application and verified that I am speaking with the correct person using two identifiers.  Location: Patient: [place] Provider: Freeman Caldron, PA-C   I discussed the limitations of evaluation and management by telemedicine and the availability of in person appointments. The patient expressed understanding and agreed to proceed.  PATIENT visit by video virtually in the context of Covid-19 pandemic. Patient location:  home My Location:  [place] office Persons on the call:  Me, the patient, her daughter Caryl Asp, and Elmon Else   History of Present Illness:   After hospitalization 9/18-10/19/2021 for Covid 19.  Still on 3-4 L O2.  Doing well.  Appetite is good.  No N/V/D.  Still coughing some;  Mucus in clear.  Energy continues to improve.  Last dose of prednisone is today.  Wants a cough medicine that is less expensive.  She was not discharged with albuterol-only dulera.  No fever.  Her daughter also had covid but is fine now.    From discharge summary: INITIAL HISTORY: 64yo with a history of bipolar disorder, obesity, and GERD who was admitted to the hospital 9/18 with severe acute hypoxic respiratory failure and diagnosed with Covid pneumonia. The patient was not vaccinated against Covid. The patient was under the care of PCCM in the ICU and BiPAP dependent for a significant portion of the hospital stay. She was transferred to the hospital service 9/29. At that time she was requiring 50 L of heated high flow nasal cannula support with additional nonrebreather mask support.  Significant Events:  9/18 admit via [place]  9/19 CTa chest no PE -near confluent dense groundglass opacities all lobes bilaterally 9/21 TTE unrevealing 9/23 bilateral lower extremity venous  duplex negative 9/29 [facility] assumed [facility]  10/2 bilateral lower extremity venous duplex negative 10/3 CTa chest without PE  Date of Positive COVID Test:  9/19  Date Isolation Ends: 10/9  Vaccination Status: Not vaccinated against Covid  COVID-19 specific Treatment: Baricitinib 9/19 > 10/2 Steroid 9/18 > Remdesivir 9/19 > 9/23  Antimicrobials: Cefepime 9/23 > 9/27 Levaquin 9/29 > 10/2   HOSPITAL COURSE:   Acute respiratory failure with hypoxia/pneumonia due to COVID-19 Patient completed a course of remdesivir and baricitinib.  She has been on steroids since admission.  She has had a very slow recovery process.  She was initially requiring heated high flow oxygen up to 55 L/min.  Was briefly on BiPAP as well. O2 Requirements have been slowly decreasing over the past 2 weeks.  She is now down to nasal cannula at 4 Lpm.  She does tend to desaturate with ambulation but recovers quickly.  She feels stronger.  She will be continued on steroids for few more days.  Lasix for a few more days.  She was kept in negative fluid balance during this hospital stay.  Antitussive agents will also be prescribed. Home health will be ordered.  Home oxygen will be ordered.  Patient looking forward to going home.  Sinus tachycardia TSH normal. Echocardiogram did not show any significant abnormalities except for elevated pulmonary artery systolic pressure. Tachycardia seems to be stable.   Continue metoprolol.  Elevated D-dimer Likely secondary to COVID-19.  CTA x2 and venous duplex lower extremities x2 negative for thrombus.   Normocytic anemia Hemoglobin  has been stable. No evidence of overt blood loss.   Transaminitis Likely secondary to COVID-19. It is gradually improving.   Needs to be checked in 1 to 2 weeks.  Indeterminate QuantiFERON Obtained while in ICU. No evidence on CTA chest of active tuberculosis.  GERD Continue PPI  Obesity Estimated body mass index  is 39.09 kg/m as calculated from the following:   Height as of this encounter: 5\' 4"  (1.626 m).   Weight as of this encounter: 103.3 kg.    Observations/Objective:  NAD.  Using O2.  Voice is a little hoarse at times.  No cough during appt.  A&Ox3.     Assessment and Plan: 1. Pneumonia due to COVID-19 virus Resolving;  Sent phenergan DM, albuterol and additional dulera.    2. Hyperglycemia Will assess at follow-up;  Partly due to steroid.  I have had a lengthy discussion and provided education about insulin resistance and the intake of too much sugar/refined carbohydrates.  I have advised the patient to work at a goal of eliminating sugary drinks, candy, desserts, sweets, refined sugars, processed foods, and white carbohydrates.  The patient expresses understanding.    3. Hospital discharge follow-up Doing well overall.  Use O2 as needed   Follow Up Instructions: Assign PCP in ~ 3 weeks   I discussed the assessment and treatment plan with the patient. The patient was provided an opportunity to ask questions and all were answered. The patient agreed with the plan and demonstrated an understanding of the instructions.   The patient was advised to call back or seek an in-person evaluation if the symptoms worsen or if the condition fails to improve as anticipated.  I provided 15 minutes of non-face-to-face time during this encounter.   Freeman Caldron, PA-C

## 2020-10-14 ENCOUNTER — Other Ambulatory Visit: Payer: Self-pay

## 2020-10-14 ENCOUNTER — Inpatient Hospital Stay: Payer: Self-pay | Admitting: Physician Assistant

## 2020-10-14 ENCOUNTER — Ambulatory Visit: Payer: HRSA Program | Attending: Physician Assistant | Admitting: Physician Assistant

## 2020-10-14 ENCOUNTER — Other Ambulatory Visit: Payer: Self-pay | Admitting: Physician Assistant

## 2020-10-14 DIAGNOSIS — Z09 Encounter for follow-up examination after completed treatment for conditions other than malignant neoplasm: Secondary | ICD-10-CM

## 2020-10-14 DIAGNOSIS — J1282 Pneumonia due to coronavirus disease 2019: Secondary | ICD-10-CM | POA: Diagnosis not present

## 2020-10-14 DIAGNOSIS — R739 Hyperglycemia, unspecified: Secondary | ICD-10-CM

## 2020-10-14 DIAGNOSIS — U071 COVID-19: Secondary | ICD-10-CM | POA: Diagnosis not present

## 2020-10-14 MED ORDER — MOMETASONE FURO-FORMOTEROL FUM 200-5 MCG/ACT IN AERO: 2.0000 | INHALATION_SPRAY | Freq: Two times a day (BID) | RESPIRATORY_TRACT | 1 refills | Status: DC

## 2020-10-14 MED ORDER — PROMETHAZINE-DM 6.25-15 MG/5ML PO SYRP: 5.0000 mL | ORAL_SOLUTION | Freq: Four times a day (QID) | ORAL | 0 refills | Status: DC | PRN

## 2020-10-14 MED ORDER — ALBUTEROL SULFATE HFA 108 (90 BASE) MCG/ACT IN AERS: 2.0000 | INHALATION_SPRAY | Freq: Four times a day (QID) | RESPIRATORY_TRACT | 2 refills | Status: DC | PRN

## 2020-10-14 MED FILL — ALBUTEROL SULFATE HFA 108 (: 108 (90 BAS | 25 days supply | Qty: 18 | Fill #0

## 2020-10-14 MED FILL — PROMETHAZINE W/DM SYRUP: 6.25-15 | 5 days supply | Qty: 118 | Fill #0

## 2020-10-14 NOTE — Progress Notes (Signed)
Per pt daughter wants in office appt asap

## 2020-10-26 ENCOUNTER — Ambulatory Visit (INDEPENDENT_AMBULATORY_CARE_PROVIDER_SITE_OTHER): Payer: HRSA Program

## 2020-10-26 ENCOUNTER — Ambulatory Visit (INDEPENDENT_AMBULATORY_CARE_PROVIDER_SITE_OTHER): Payer: HRSA Program | Admitting: Adult Health

## 2020-10-26 ENCOUNTER — Encounter: Payer: Self-pay | Admitting: Adult Health

## 2020-10-26 ENCOUNTER — Telehealth: Payer: Self-pay | Admitting: Internal Medicine

## 2020-10-26 ENCOUNTER — Other Ambulatory Visit: Payer: Self-pay

## 2020-10-26 VITALS — BP 110/70 | HR 116 | Temp 98.8°F | Ht 64.5 in | Wt 223.8 lb

## 2020-10-26 DIAGNOSIS — R5381 Other malaise: Secondary | ICD-10-CM

## 2020-10-26 DIAGNOSIS — U071 COVID-19: Secondary | ICD-10-CM | POA: Diagnosis not present

## 2020-10-26 DIAGNOSIS — I272 Pulmonary hypertension, unspecified: Secondary | ICD-10-CM

## 2020-10-26 DIAGNOSIS — J1282 Pneumonia due to coronavirus disease 2019: Secondary | ICD-10-CM | POA: Diagnosis not present

## 2020-10-26 DIAGNOSIS — J8410 Pulmonary fibrosis, unspecified: Secondary | ICD-10-CM | POA: Diagnosis not present

## 2020-10-26 DIAGNOSIS — J9611 Chronic respiratory failure with hypoxia: Secondary | ICD-10-CM | POA: Diagnosis not present

## 2020-10-26 MED ORDER — PREDNISONE 10 MG PO TABS: ORAL_TABLET | ORAL | 0 refills | Status: DC

## 2020-10-26 NOTE — Assessment & Plan Note (Signed)
Pulmonary hypertension with elevated pressures noted on 2D echo during hospitalization with critical illness CT chest negative for PE x2.  Venous Dopplers were negative for DVT. Patient is continue on oxygen to keep O2 saturations greater than 88%.  No previous diagnosis of pulmonary hypertension prior to admission.  No previous diagnosed as of sleep apnea We will repeat 2D echo in 6 weeks if pressures remain elevated will need further evaluation

## 2020-10-26 NOTE — Progress Notes (Signed)
@Patient  ID: Wendy Ruiz, female    DOB: 1956-02-05, 64 y.o.   MRN: [id number]  Chief Complaint  Patient presents with  . Follow-up    Referring provider: Antony Blackbird, MD  HPI: 64 year old female former smoker with history of bipolar disorder seen for PCCM consult during hospitalization for UDJSH-70 infection complicated by severe acute hypoxic respiratory failure and Covid pneumonia.  Patient had a prolonged hospitalization September 18 through September 29, 2020.  She was treated high flow oxygen, BiPAP,Baricitinib, Steroids, Remdesivir   TEST/EVENTS :  August 30, 2020 + COVID-19 test CT chest August 30, 2020 - for PE, positive for groundglass opacities Venous Dopplers September 23 - for DVT Repeat CT chest October 3 - for PE, extensive groundglass and consolidative opacities bilaterally  2D echo September 22nd 2021 EF 60 to 65%, normal right ventricle systolic function and size.  Severely elevated pulmonary artery systolic pressure   26/37/8588 Follow up : COVID-19 pneumonia, oxygen dependent respiratory failure, pulmonary hypertension Patient presents for a post hospital follow-up.  Patient was recently admitted for a prolonged critical illness with COVID-19 infection, acute respiratory failure and Covid pneumonia.  She was seen by pulmonary and critical care team during her hospitalization.  She was treated with high flow oxygen, BiPAP support. Baricitinib, steroids and remdesivir, therapeutic dose of lovenox (elevated D Dimer) .  Along with antibiotics including cefepime and Levaquin.  She did require heated high flow oxygen up to 55 L/min. CT chest was negative x2 and showed extensive groundglass and consolidative opacities.  Venous Dopplers were negative.  Echo showed preserved EF but severely elevated pulmonary artery systolic pressures. Patient had a slow recovery and was discharged on 4 L of oxygen. Since discharge patient is feeling better but still has ongoing  cough, dyspnea with activity , drop in oxygen levels with activity . Feels like recovery time is getting better but will drop O2 in 70s with activity . Resolves with rest on 4l/m . She does not increase her oxygen with activity.  Chest x-ray today shows persistent bilateral airspace opacities. Completed OT /PT at home. Balance is good, needs rolling walker to help with rest breaks to help with dyspnea and low O2 . No fever, edema , orthopnea.  We discussed pulmonary rehab , she wants to hold off on this right now.  She was seen by PCP , started on Dulera and Phenergan cough syrup . Has not seen much change in symptoms in last week.  She says O2 saturations in the hospital did drop prior to discharge but she quickly recovered she says this has been going on since discharge.  Is actually getting some better with faster recovery's.  She says she has never been told to increase her oxygen with activity.. She says her appetite is good with no nausea vomiting or diarrhea.  Prior to Covid , was in good health, lived alone. 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 . No known lung problems. Not on oxygen or inhalers prior to admission.  Has returned back to work part time from home.      Allergies  Allergen Reactions  . Penicillins Rash    Immunization History  Administered Date(s) Administered  . Td 04/16/2005    Past Medical History:  Diagnosis Date  . Bipolar 1 disorder ([place])   . 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 Obesity     Tobacco History: Social History   Tobacco Use  Smoking Status Former Smoker  Smokeless Tobacco Never  Used   Counseling given: Not Answered   Outpatient Medications Prior to Visit  Medication Sig Dispense Refill  . albuterol (VENTOLIN HFA) 108 (90 Base) MCG/ACT inhaler Inhale 2 puffs into the lungs every 6 (six) hours as needed for wheezing or shortness of breath. 8 g 2  . mometasone-formoterol (DULERA) 200-5 MCG/ACT AERO Inhale 2 puffs into the  lungs 2 (two) times daily. 1 each 1  . OVER THE COUNTER MEDICATION Take 1 tablet by mouth every 12 (twelve) hours as needed.    . chlorpheniramine-HYDROcodone (TUSSIONEX) 10-8 MG/5ML SUER Take 5 mLs by mouth every 12 (twelve) hours as needed (breakthrough cough). (Patient not taking: Reported on 10/26/2020) 140 mL 0  . furosemide (LASIX) 20 MG tablet Take 1 tablet (20 mg total) by mouth daily for 7 days. 7 tablet 0  . pantoprazole (PROTONIX) 40 MG tablet Take 1 tablet (40 mg total) by mouth daily at 12 noon for 21 days. 21 tablet 0  . polyethylene glycol (MIRALAX / GLYCOLAX) 17 g packet Take 17 g by mouth daily as needed. (Patient not taking: Reported on 10/26/2020) 14 each 0  . potassium chloride 20 MEQ TBCR Take 20 mEq by mouth daily for 7 days. 7 tablet 0  . promethazine-dextromethorphan (PROMETHAZINE-DM) 6.25-15 MG/5ML syrup Take 5 mLs by mouth 4 (four) times daily as needed for cough. (Patient not taking: Reported on 10/26/2020) 118 mL 0  . benzonatate (TESSALON) 200 MG capsule Take 1 capsule (200 mg total) by mouth 3 (three) times daily. (Patient not taking: Reported on 10/26/2020) 30 capsule 0   No facility-administered medications prior to visit.     Review of Systems:   Constitutional:   No  weight loss, night sweats,  Fevers, chills,  +fatigue, or  lassitude.  HEENT:   No headaches,  Difficulty swallowing,  Tooth/dental problems, or  Sore throat,                No sneezing, itching, ear ache, nasal congestion, post nasal drip,   CV:  No chest pain,  Orthopnea, PND, swelling in lower extremities, anasarca, dizziness, palpitations, syncope.   GI  No heartburn, indigestion, abdominal pain, nausea, vomiting, diarrhea, change in bowel habits, loss of appetite, bloody stools.   Resp:    No chest wall deformity  Skin: no rash or lesions.  GU: no dysuria, change in color of urine, no urgency or frequency.  No flank pain, no hematuria   MS:  No joint pain or swelling.  No decreased  range of motion.  No back pain.    Physical Exam  BP 110/70 (BP Location: Right Arm, Patient Position: Sitting, Cuff Size: Normal)   Pulse (!) 116   Temp 98.8 F (37.1 C) (Temporal)   Ht 5' 4.5" (1.638 m)   Wt 223 lb 12.8 oz (101.5 kg)   SpO2 91%   BMI 37.82 kg/m   GEN: A/Ox3; pleasant , NAD, well nourished , on o2 , rolling walker    HEENT:  [place]/AT, , NOSE-clear, THROAT-clear, no lesions, no postnasal drip or exudate noted.   NECK:  Supple w/ fair ROM; no JVD; normal carotid impulses w/o bruits; no thyromegaly or nodules palpated; no lymphadenopathy.    RESP  Faint BB crackles  no accessory muscle use, no dullness to percussion  CARD:  RRR, no m/r/g, no peripheral edema, pulses intact, no cyanosis or clubbing.  GI:   Soft & nt; nml bowel sounds; no organomegaly or masses detected.   [place]co: Warm bil,  no deformities or joint swelling noted.   Neuro: alert, no focal deficits noted.    Skin: Warm, no lesions or rashes    Lab Results:  CBC    Component Value Date/Time   WBC 13.0 (H) 09/24/2020 0906   RBC 3.90 09/24/2020 0906   HGB 10.3 (L) 09/24/2020 0906   HCT 34.8 (L) 09/24/2020 0906   PLT 201 09/24/2020 0906   MCV 89.2 09/24/2020 0906   MCH 26.4 09/24/2020 0906   MCHC 29.6 (L) 09/24/2020 0906   RDW 13.5 09/24/2020 0906   LYMPHSABS 1.3 09/17/2020 0334   MONOABS 0.7 09/17/2020 0334   EOSABS 0.1 09/17/2020 0334   BASOSABS 0.0 09/17/2020 0334    BMET    Component Value Date/Time   NA 136 09/29/2020 0218   K 3.5 09/29/2020 0218   CL 92 (L) 09/29/2020 0218   CO2 32 09/29/2020 0218   GLUCOSE 127 (H) 09/29/2020 0218   BUN 13 09/29/2020 0218   CREATININE 0.76 09/29/2020 0218   CALCIUM 9.1 09/29/2020 0218   GFRNONAA >60 09/29/2020 0218   GFRAA >60 09/15/2020 0408    BNP    Component Value Date/Time   BNP 28.3 09/20/2020 0500    ProBNP No results found for: PROBNP  Imaging: DG Chest 2 View  Result Date: 10/26/2020 CLINICAL DATA:  Post COVID  pneumonia. EXAM: CHEST - 2 VIEW COMPARISON:  None. FINDINGS: Similar cardiomediastinal silhouette. Increased conspicuity of multifocal bilateral airspace opacities. No visible pleural effusions or pneumothorax. No acute osseous abnormality. IMPRESSION: Increased conspicuity of multifocal bilateral airspace opacities, concerning for multifocal pneumonia. Recommend follow-up to resolution. Electronically Signed   By: Margaretha Sheffield MD   On: 10/26/2020 15:26      No flowsheet data found.  No results found for: NITRICOXIDE      Assessment & Plan:   Postinflammatory pulmonary fibrosis ([place]) Patient with critical illness with UUVOZ-36 infection complicated by severe hypoxemia and Covid pneumonia.  Patient continues to have abnormal bilateral opacities.  Likely with a postinflammatory pulmonary fibrosis.  Will need close follow-up with follow-up CT chest, pulmonary function testing. We will set up for PFTs on return visit.  On return visit will decide on timing of next CT.   Plan  Patient Instructions  Prednisone taper over next week  Labs today .  Continue on Dulera 2 puffs Twice daily , rinse after use.  Albuterol inhaler As needed   Mucinex DM Twice daily As needed  Cough/congestion  2 D echo in 6 weeks (follow up pulmonary hypertension)  Continue on Oxygen 4l/m at rest , 8 l/m with walking .   Follow up in office in 6 weeks with Dr. Shearon Stalls with PFTs  Please contact office for sooner follow up if symptoms do not improve or worsen or seek emergency care        Pneumonia due to COVID-19 virus Patient is clinically improving however continues to have ongoing cough and bilateral opacities.  We will treat with a short course of steroids.  Check labs including BNP and sed rate. Continue on Dulera. Check PFTs on return visit.  Plan  Patient Instructions  Prednisone taper over next week  Labs today .  Continue on Dulera 2 puffs Twice daily , rinse after use.  Albuterol inhaler As  needed   Mucinex DM Twice daily As needed  Cough/congestion  2 D echo in 6 weeks (follow up pulmonary hypertension)  Continue on Oxygen 4l/m at rest , 8 l/m with walking .  Follow up in office in 6 weeks with Dr. Shearon Stalls with PFTs  Please contact office for sooner follow up if symptoms do not improve or worsen or seek emergency care     n   Chronic respiratory failure with hypoxia [facility]) Patient has severe hypoxemia associated with COVID-19 and COVID-19 pneumonia.  She is made significant improvement but remains oxygen dependent.  She requires 4 L of oxygen at rest.  She does require 8 L of oxygen walking in the office to maintain O2 saturations greater than 88%.  Plan  Patient Instructions  Prednisone taper over next week  Labs today .  Continue on Dulera 2 puffs Twice daily , rinse after use.  Albuterol inhaler As needed   Mucinex DM Twice daily As needed  Cough/congestion  2 D echo in 6 weeks (follow up pulmonary hypertension)  Continue on Oxygen 4l/m at rest , 8 l/m with walking .   Follow up in office in 6 weeks with Dr. Shearon Stalls with PFTs  Please contact office for sooner follow up if symptoms do not improve or worsen or seek emergency care        Physical deconditioning Patient with critical illness prolonged hospitalization she has general physical deconditioning.  She has completed home physical therapy and Occupational Therapy.  We discussed pulmonary rehab which I think she would be a good candidate for.  However this time she wants to wait until her follow-up visit.  She has not returned back to driving.  She is working partially from home.  Patient is encouraged on activity as tolerated.  And to wear oxygen with activities to keep O2 saturations greater than 88 to 90%.  She does require 8 L with walking to maintain adequate O2 saturations  Pulmonary hypertension ([place]) Pulmonary hypertension with elevated pressures noted on 2D echo during hospitalization with critical  illness CT chest negative for PE x2.  Venous Dopplers were negative for DVT. Patient is continue on oxygen to keep O2 saturations greater than 88%.  No previous diagnosis of pulmonary hypertension prior to admission.  No previous diagnosed as of sleep apnea We will repeat 2D echo in 6 weeks if pressures remain elevated will need further evaluation      Rexene Edison, NP 10/26/2020

## 2020-10-26 NOTE — Assessment & Plan Note (Signed)
Patient with critical illness prolonged hospitalization she has general physical deconditioning.  She has completed home physical therapy and Occupational Therapy.  We discussed pulmonary rehab which I think she would be a good candidate for.  However this time she wants to wait until her follow-up visit.  She has not returned back to driving.  She is working partially from home.  Patient is encouraged on activity as tolerated.  And to wear oxygen with activities to keep O2 saturations greater than 88 to 90%.  She does require 8 L with walking to maintain adequate O2 saturations

## 2020-10-26 NOTE — Patient Instructions (Addendum)
Prednisone taper over next week  Labs today .  Continue on Dulera 2 puffs Twice daily , rinse after use.  Albuterol inhaler As needed   Mucinex DM Twice daily As needed  Cough/congestion  2 D echo in 6 weeks (follow up pulmonary hypertension)  Continue on Oxygen 4l/m at rest , 8 l/m with walking .   Follow up in office in 6 weeks with Dr. Shearon Stalls with PFTs  Please contact office for sooner follow up if symptoms do not improve or worsen or seek emergency care

## 2020-10-26 NOTE — Telephone Encounter (Signed)
Patient's daughter called concerned about patient's cough and shortness of breath with low oxygen.   Called spoke with patient.  She states her cough has gotten worse since starting the promethazine-dextromethorphan cough syrup, so she stopped that and started Mucinex DM and states it has helped over the last two days. Patient is also taking the dulera and the albuterol. Patient is using albuterol every 4 hours 2 puffs each time for the past 3 days.   Patient is also short of breath. Patient is on 4L oxygen when sitting still and walking. She feels her oxygen is dropping when she stands up, but hasn't checked the number. She said her oxygen has dropped two times while she was sitting still to 71%, but it recovered quickly within a minute to the 90s.    Patient never seen in our office but was seen by Dr. Chase Caller in the hospital and has an consult with Dr. Shearon Stalls on 11/17/20  Patient scheduled with Tammy Parrett as Hospital Follow up at 2:30pm 10/26/20

## 2020-10-26 NOTE — Assessment & Plan Note (Signed)
Patient has severe hypoxemia associated with COVID-19 and COVID-19 pneumonia.  She is made significant improvement but remains oxygen dependent.  She requires 4 L of oxygen at rest.  She does require 8 L of oxygen walking in the office to maintain O2 saturations greater than 88%.  Plan  Patient Instructions  Prednisone taper over next week  Labs today .  Continue on Dulera 2 puffs Twice daily , rinse after use.  Albuterol inhaler As needed   Mucinex DM Twice daily As needed  Cough/congestion  2 D echo in 6 weeks (follow up pulmonary hypertension)  Continue on Oxygen 4l/m at rest , 8 l/m with walking .   Follow up in office in 6 weeks with Dr. Shearon Stalls with PFTs  Please contact office for sooner follow up if symptoms do not improve or worsen or seek emergency care

## 2020-10-26 NOTE — Assessment & Plan Note (Signed)
Patient is clinically improving however continues to have ongoing cough and bilateral opacities.  We will treat with a short course of steroids.  Check labs including BNP and sed rate. Continue on Dulera. Check PFTs on return visit.  Plan  Patient Instructions  Prednisone taper over next week  Labs today .  Continue on Dulera 2 puffs Twice daily , rinse after use.  Albuterol inhaler As needed   Mucinex DM Twice daily As needed  Cough/congestion  2 D echo in 6 weeks (follow up pulmonary hypertension)  Continue on Oxygen 4l/m at rest , 8 l/m with walking .   Follow up in office in 6 weeks with Dr. Shearon Stalls with PFTs  Please contact office for sooner follow up if symptoms do not improve or worsen or seek emergency care     n

## 2020-10-26 NOTE — Progress Notes (Signed)
cfoll

## 2020-10-26 NOTE — Assessment & Plan Note (Signed)
Patient with critical illness with XYIAX-65 infection complicated by severe hypoxemia and Covid pneumonia.  Patient continues to have abnormal bilateral opacities.  Likely with a postinflammatory pulmonary fibrosis.  Will need close follow-up with follow-up CT chest, pulmonary function testing. We will set up for PFTs on return visit.  On return visit will decide on timing of next CT.   Plan  Patient Instructions  Prednisone taper over next week  Labs today .  Continue on Dulera 2 puffs Twice daily , rinse after use.  Albuterol inhaler As needed   Mucinex DM Twice daily As needed  Cough/congestion  2 D echo in 6 weeks (follow up pulmonary hypertension)  Continue on Oxygen 4l/m at rest , 8 l/m with walking .   Follow up in office in 6 weeks with Dr. Shearon Stalls with PFTs  Please contact office for sooner follow up if symptoms do not improve or worsen or seek emergency care

## 2020-10-27 LAB — BASIC METABOLIC PANEL
BUN: 5 mg/dL — ABNORMAL LOW (ref 6–23)
CO2: 30 mEq/L (ref 19–32)
Calcium: 9.3 mg/dL (ref 8.4–10.5)
Chloride: 100 mEq/L (ref 96–112)
Creatinine, Ser: 0.62 mg/dL (ref 0.40–1.20)
GFR: 95.33 mL/min (ref >60.00)
Glucose, Bld: 117 mg/dL — ABNORMAL HIGH (ref 70–99)
Potassium: 3.7 mEq/L (ref 3.5–5.1)
Sodium: 138 mEq/L (ref 135–145)

## 2020-10-27 LAB — CBC WITH DIFFERENTIAL/PLATELET
Basophils Absolute: 0.1 10*3/uL (ref 0.0–0.1)
Basophils Relative: 0.6 % (ref 0.0–3.0)
Eosinophils Absolute: 0.6 10*3/uL (ref 0.0–0.7)
Eosinophils Relative: 4.6 % (ref 0.0–5.0)
HCT: 36.6 % (ref 36.0–46.0)
Hemoglobin: 11.2 g/dL — ABNORMAL LOW (ref 12.0–15.0)
Lymphocytes Relative: 11.4 % — ABNORMAL LOW (ref 12.0–46.0)
Lymphs Abs: 1.6 10*3/uL (ref 0.7–4.0)
MCHC: 30.6 g/dL (ref 30.0–36.0)
MCV: 83.6 fl (ref 78.0–100.0)
Monocytes Absolute: 0.8 10*3/uL (ref 0.1–1.0)
Monocytes Relative: 5.7 % (ref 3.0–12.0)
Neutro Abs: 10.6 10*3/uL — ABNORMAL HIGH (ref 1.4–7.7)
Neutrophils Relative %: 77.7 % — ABNORMAL HIGH (ref 43.0–77.0)
Platelets: 295 10*3/uL (ref 150.0–400.0)
RBC: 4.38 Mil/uL (ref 3.87–5.11)
RDW: 14.9 % (ref 11.5–15.5)
WBC: 13.7 10*3/uL — ABNORMAL HIGH (ref 4.0–10.5)

## 2020-10-27 LAB — BRAIN NATRIURETIC PEPTIDE: Pro B Natriuretic peptide (BNP): 83 pg/mL (ref 0.0–100.0)

## 2020-11-10 NOTE — Progress Notes (Signed)
ATCx1, left vm for patient to return call regarding lab results.

## 2020-11-11 ENCOUNTER — Ambulatory Visit: Payer: Self-pay | Admitting: Family Medicine

## 2020-11-12 NOTE — Progress Notes (Signed)
ATC x2, LVM to return call.

## 2020-11-13 ENCOUNTER — Telehealth: Payer: Self-pay | Admitting: Adult Health

## 2020-11-13 NOTE — Telephone Encounter (Signed)
See result note.  

## 2020-11-13 NOTE — Progress Notes (Signed)
Called and spoke with patient, advised of results and recommendations per Rexene Edison NP. She verbalized understanding.  Routing results to pcp.

## 2020-11-17 ENCOUNTER — Institutional Professional Consult (permissible substitution): Payer: Self-pay | Admitting: Internal Medicine

## 2020-11-25 ENCOUNTER — Telehealth: Payer: Self-pay | Admitting: Adult Health

## 2020-11-25 NOTE — Telephone Encounter (Signed)
Daughter called patient with soft lump on back that popped up . No change in breathing, no increased oxygen demands. No pain, rash, bruising or known injury . Does not have PCP .  Advised will need ov to evaluate.  Please set up in office visit for evaluation   Please contact office for sooner follow up if symptoms do not improve or worsen or seek emergency care

## 2020-11-25 NOTE — Telephone Encounter (Signed)
11/25/2020  Attempted to call patient.  Left voicemail.  Instructed patient that when she calls back to her office to schedule an office visit with Dr. Shearon Stalls for further evaluation  Patient can also utilize an urgent care or emergency room for evaluation as it is document here patient does not have a primary care provider.  Wyn Quaker, FNP

## 2020-11-26 NOTE — Telephone Encounter (Signed)
Called and spoke to pt's daughter. Informed her of the recs per TP and BPM. She verbalized understanding and states the swelling on pt's back has resolved and pt no longer wants to come in for an appt. Pt will call back if she wants to make an appt at this time. Will forward to Dr. Shearon Stalls as Juluis Rainier.

## 2020-11-30 ENCOUNTER — Other Ambulatory Visit: Payer: Self-pay | Admitting: *Deleted

## 2020-11-30 ENCOUNTER — Other Ambulatory Visit: Payer: Self-pay | Admitting: Family Medicine

## 2020-11-30 DIAGNOSIS — U071 COVID-19: Secondary | ICD-10-CM

## 2020-11-30 DIAGNOSIS — J8410 Pulmonary fibrosis, unspecified: Secondary | ICD-10-CM

## 2020-11-30 MED ORDER — ALBUTEROL SULFATE HFA 108 (90 BASE) MCG/ACT IN AERS: 2.0000 | INHALATION_SPRAY | Freq: Four times a day (QID) | RESPIRATORY_TRACT | 0 refills | Status: DC | PRN

## 2020-11-30 MED ORDER — MOMETASONE FURO-FORMOTEROL FUM 200-5 MCG/ACT IN AERO: 2.0000 | INHALATION_SPRAY | Freq: Two times a day (BID) | RESPIRATORY_TRACT | 0 refills | Status: DC

## 2020-11-30 MED FILL — ALBUTEROL SULFATE HFA 108 (: 108 (90 BAS | 25 days supply | Qty: 18 | Fill #0

## 2020-11-30 MED FILL — !DULERA 200 MCG/5 MCG INH: 200-5 | 30 days supply | Qty: 13 | Fill #0

## 2020-11-30 NOTE — Telephone Encounter (Signed)
Medication:  mometasone-formoterol (DULERA) 200-5 MCG/ACT AERO [[id number]]  albuterol (VENTOLIN HFA) 108 (90 Base) MCG/ACT inhaler [[id number]]   Has the patient contacted their pharmacy? no (Agent: If no, request that the patient contact the pharmacy for the refill.)  Preferred Pharmacy (with phone number or street name):  [facility], [address]  [address], [place] [address]  Phone:  [phone] Fax:  [phone]  Agent: Please be advised that RX refills may take up to 3 business days. We ask that you follow-up with your pharmacy.

## 2020-12-03 ENCOUNTER — Other Ambulatory Visit (HOSPITAL_COMMUNITY)
Admission: RE | Admit: 2020-12-03 | Discharge: 2020-12-03 | Disposition: A | Discharge status: Home / Self Care | Payer: HRSA Program | Source: Ambulatory Visit | Attending: Adult Health | Admitting: Adult Health

## 2020-12-03 DIAGNOSIS — Z20822 Contact with and (suspected) exposure to covid-19: Secondary | ICD-10-CM | POA: Insufficient documentation

## 2020-12-03 LAB — SARS CORONAVIRUS 2 (TAT 6-24 HRS): SARS Coronavirus 2: NEGATIVE

## 2020-12-07 ENCOUNTER — Other Ambulatory Visit: Payer: Self-pay

## 2020-12-07 ENCOUNTER — Ambulatory Visit (INDEPENDENT_AMBULATORY_CARE_PROVIDER_SITE_OTHER): Payer: Self-pay | Admitting: Internal Medicine

## 2020-12-07 DIAGNOSIS — J1282 Pneumonia due to coronavirus disease 2019: Secondary | ICD-10-CM

## 2020-12-07 DIAGNOSIS — J8410 Pulmonary fibrosis, unspecified: Secondary | ICD-10-CM

## 2020-12-07 DIAGNOSIS — U071 COVID-19: Secondary | ICD-10-CM

## 2020-12-07 NOTE — Progress Notes (Signed)
Patient was not able to complete PFT due to excessive cough and mucus.

## 2020-12-08 ENCOUNTER — Other Ambulatory Visit (HOSPITAL_COMMUNITY): Payer: Self-pay

## 2020-12-21 ENCOUNTER — Other Ambulatory Visit: Payer: Self-pay

## 2020-12-21 ENCOUNTER — Ambulatory Visit (HOSPITAL_COMMUNITY): Payer: HRSA Program | Attending: Internal Medicine

## 2020-12-21 DIAGNOSIS — J9610 Chronic respiratory failure, unspecified whether with hypoxia or hypercapnia: Secondary | ICD-10-CM | POA: Insufficient documentation

## 2020-12-21 DIAGNOSIS — J1282 Pneumonia due to coronavirus disease 2019: Secondary | ICD-10-CM

## 2020-12-21 DIAGNOSIS — I272 Pulmonary hypertension, unspecified: Secondary | ICD-10-CM

## 2020-12-21 DIAGNOSIS — U071 COVID-19: Secondary | ICD-10-CM

## 2020-12-21 DIAGNOSIS — R059 Cough, unspecified: Secondary | ICD-10-CM | POA: Diagnosis not present

## 2020-12-21 DIAGNOSIS — R739 Hyperglycemia, unspecified: Secondary | ICD-10-CM | POA: Insufficient documentation

## 2020-12-21 DIAGNOSIS — R109 Unspecified abdominal pain: Secondary | ICD-10-CM | POA: Diagnosis not present

## 2020-12-21 DIAGNOSIS — E669 Obesity, unspecified: Secondary | ICD-10-CM | POA: Insufficient documentation

## 2020-12-21 LAB — ECHOCARDIOGRAM COMPLETE
AR max vel: 2 cm2
AV Area VTI: 2.26 cm2
AV Area mean vel: 1.65 cm2
AV Mean grad: 9 mmHg
AV Peak grad: 18 mmHg
Ao pk vel: 2.12 m/s
Area-P 1/2: 5.02 cm2
S' Lateral: 2.2 cm

## 2020-12-23 NOTE — Progress Notes (Signed)
ATC patient x1, left vm to return call to schedule office visit.  According to her appointment, the PFT was completed on 12/07/2020, however, I do not see the results in [place], will verify with patient.

## 2020-12-24 ENCOUNTER — Other Ambulatory Visit: Payer: Self-pay

## 2020-12-24 ENCOUNTER — Other Ambulatory Visit: Payer: Self-pay | Admitting: Physician Assistant

## 2020-12-24 ENCOUNTER — Encounter: Payer: Self-pay | Admitting: Physician Assistant

## 2020-12-24 ENCOUNTER — Ambulatory Visit: Payer: Self-pay | Attending: Family Medicine | Admitting: Physician Assistant

## 2020-12-24 VITALS — BP 113/64 | HR 80 | Temp 98.3°F | Ht 64.5 in | Wt 225.0 lb

## 2020-12-24 DIAGNOSIS — R609 Edema, unspecified: Secondary | ICD-10-CM

## 2020-12-24 DIAGNOSIS — R739 Hyperglycemia, unspecified: Secondary | ICD-10-CM

## 2020-12-24 DIAGNOSIS — J8410 Pulmonary fibrosis, unspecified: Secondary | ICD-10-CM

## 2020-12-24 DIAGNOSIS — D72829 Elevated white blood cell count, unspecified: Secondary | ICD-10-CM

## 2020-12-24 DIAGNOSIS — D171 Benign lipomatous neoplasm of skin and subcutaneous tissue of trunk: Secondary | ICD-10-CM

## 2020-12-24 LAB — GLUCOSE, POCT (MANUAL RESULT ENTRY): POC Glucose: 127 mg/dl — AB (ref 70–99)

## 2020-12-24 LAB — POCT GLYCOSYLATED HEMOGLOBIN (HGB A1C): HbA1c POC (<> result, manual entry): 5.4 % (ref 4.0–5.6)

## 2020-12-24 MED ORDER — ALBUTEROL SULFATE HFA 108 (90 BASE) MCG/ACT IN AERS: 2.0000 | INHALATION_SPRAY | Freq: Four times a day (QID) | RESPIRATORY_TRACT | 2 refills | Status: DC | PRN

## 2020-12-24 MED ORDER — MOMETASONE FURO-FORMOTEROL FUM 200-5 MCG/ACT IN AERO: 2.0000 | INHALATION_SPRAY | Freq: Two times a day (BID) | RESPIRATORY_TRACT | 3 refills | Status: DC

## 2020-12-24 MED ORDER — FUROSEMIDE 20 MG PO TABS: ORAL_TABLET | ORAL | 1 refills | Status: DC

## 2020-12-24 MED FILL — FUROSEMIDE 20 MG TABS: 20 | 30 days supply | Qty: 30 | Fill #0

## 2020-12-24 MED FILL — !DULERA 200 MCG/5 MCG INH: 200-5 | 30 days supply | Qty: 13 | Fill #0

## 2020-12-24 MED FILL — ALBUTEROL SULFATE HFA 108 (: 108 (90 BAS | 25 days supply | Qty: 18 | Fill #0

## 2020-12-24 NOTE — Progress Notes (Signed)
Patient ID: Wendy Ruiz, female   DOB: 05/10/1956, 65 y.o.   MRN: [id number]   Wendy Ruiz, is a 65 y.o. female  TDV:[id number]  [id number]:[id number]  DOB - Mar 23, 1956  Subjective:  Chief Complaint and HPI: Wendy Ruiz is a 65 y.o. female here today for follow up.  She was diagnosed with covid pneumonia and hospitalized 9/18-10/19/2021 and subsequently developed pulmonary fibrosis and is still Oxygen dependent at 3-4L.  She stopped her inhalers about 2 weeks ago bc she ran out and didn't think they were helping.  Also has B foot swelling which she took previously that helped.  She is asking about weaning from O2.  She was supposed to have PFT with Dr Shearon Stalls and they were unable to perform due to excess mucus.  No fever.  Appetite is good.  Her daughter is with her today.  She currently does not have a f/up appt with pulmonology scheduled.  Non-smoker  Also c/o "lump" on R lower to mid back she noticed as she has lost a little weight.     ED/Hospital notes/labs and pulmonology notes reviewed  ROS:   Constitutional:  No f/c, No night sweats, No unexplained weight loss. EENT:  No vision changes, No blurry vision, No hearing changes. No mouth, throat, or ear problems.  Respiratory: + cough, + SOB Cardiac: No CP, no palpitations GI:  No abd pain, No N/V/D. GU: No Urinary s/sx Musculoskeletal: No joint pain Neuro: No headache, no dizziness, no motor weakness.  Skin: No rash Endocrine:  No polydipsia. No polyuria.  Psych: Denies SI/HI  No problems updated.  ALLERGIES: Allergies  Allergen Reactions  . Penicillins Rash    PAST MEDICAL HISTORY: Past Medical History:  Diagnosis Date  . Bipolar 1 disorder ([place])   . 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 Obesity     MEDICATIONS AT HOME: Prior to Admission medications   Medication Sig Start Date End Date Taking? Authorizing Provider  OVER THE COUNTER MEDICATION Take 1 tablet by mouth every 12 (twelve) hours as needed.   Yes  [provider]  albuterol (VENTOLIN HFA) 108 (90 Base) MCG/ACT inhaler Inhale 2 puffs into the lungs every 6 (six) hours as needed for wheezing or shortness of breath. 12/24/20   Argentina Donovan, PA-C  chlorpheniramine-HYDROcodone (TUSSIONEX) 10-8 MG/5ML SUER Take 5 mLs by mouth every 12 (twelve) hours as needed (breakthrough cough). Patient not taking: Reported on 12/24/2020 09/29/20   Bonnielee Haff, MD  furosemide (LASIX) 20 MG tablet 1 daily for 5 days prn leg swelling 12/24/20   Freeman Caldron M, PA-C  mometasone-formoterol (DULERA) 200-5 MCG/ACT AERO Inhale 2 puffs into the lungs 2 (two) times daily. 12/24/20   Argentina Donovan, PA-C  pantoprazole (PROTONIX) 40 MG tablet Take 1 tablet (40 mg total) by mouth daily at 12 noon for 21 days. 09/30/20 10/21/20  Bonnielee Haff, MD  polyethylene glycol (MIRALAX / GLYCOLAX) 17 g packet Take 17 g by mouth daily as needed. Patient not taking: No sig reported 09/29/20   Bonnielee Haff, MD  predniSONE (DELTASONE) 10 MG tablet 4 tabs for 2 days, then 3 tabs for 2 days, 2 tabs for 2 days, then 1 tab for 2 days, then stop 10/26/20   Parrett, Tammy S, NP  promethazine-dextromethorphan (PROMETHAZINE-DM) 6.25-15 MG/5ML syrup Take 5 mLs by mouth 4 (four) times daily as needed for cough. Patient not taking: No sig reported 10/14/20   Argentina Donovan, PA-C     Objective:  EXAM:  Vitals:   12/24/20 1012  BP: 113/64  Pulse: 80  Temp: 98.3 F (36.8 C)  TempSrc: Oral  SpO2: 99%  Weight: 225 lb (102.1 kg)  Height: 5' 4.5" (1.638 m)    General appearance : A&OX3. NAD. Non-toxic-appearing;  On 4L O2 in office.   HEENT: Atraumatic and Normocephalic.  PERRLA. EOM intact. Neck: supple, no JVD. No cervical lymphadenopathy. No thyromegaly Chest/Lungs:  Breathing-non-labored, fair air entry bilaterally, breath sounds without rales or rhonchi.   Mild to moderate wheezing throughoutwheezing  CVS: S1 S2 regular, no murmurs, gallops, rubs   Extremities: Bilateral Lower Ext shows mild (1-2+) edema, both legs are warm to touch with = pulse throughout R mid to lower back there is a mobile lipoma that is about 5X8cm Neurology:  CN II-XII grossly intact, Non focal.   Psych:  TP linear. J/I WNL. Normal speech. Appropriate eye contact and affect.  Skin:  No Rash  Data Review Lab Results  Component Value Date   HGBA1C 5.4 12/24/2020   HGBA1C 5.7 (H) 08/31/2020     Assessment & Plan   1. Hyperglycemia Likely was due to steroids I have had a lengthy discussion and provided education about insulin resistance and the intake of too much sugar/refined carbohydrates.  I have advised the patient to work at a goal of eliminating sugary drinks, candy, desserts, sweets, refined sugars, processed foods, and white carbohydrates.  The patient expresses understanding.  - Glucose (CBG) - HgB A1c  2. Postinflammatory pulmonary fibrosis (HCC) Continue 3-4L O2  RESUME INHALERS AS DIRECTED - CBC with Differential/Platelet - mometasone-formoterol (DULERA) 200-5 MCG/ACT AERO; Inhale 2 puffs into the lungs 2 (two) times daily.  Dispense: 13 g; Refill: 3 - albuterol (VENTOLIN HFA) 108 (90 Base) MCG/ACT inhaler; Inhale 2 puffs into the lungs every 6 (six) hours as needed for wheezing or shortness of breath.  Dispense: 18 g; Refill: 2 - Comprehensive metabolic panel - CBC with Differential/Platelet -we called and scheduled pulmonology f/up asap(tuesday 12/29/2020)-  3. Leukocytosis, unspecified type Likely was due to prednisone - CBC with Differential/Platelet - Comprehensive metabolic panel - CBC with Differential/Platelet  4. Lipoma of back Once improving/better consider elective removal  5. Edema, unspecified type - furosemide (LASIX) 20 MG tablet; 1 daily for 5 days prn leg swelling  Dispense: 20 tablet; Refill: 1 - Brain natriuretic peptide - Comprehensive metabolic panel   Patient have been counseled extensively about nutrition and  exercise  Return for keep appt with Dr Wynetta Emery.  The patient was given clear instructions to go to ER or return to medical center if symptoms don't improve, worsen or new problems develop. The patient verbalized understanding. The patient was told to call to get lab results if they haven't heard anything in the next week.     Freeman Caldron, PA-C [name] [place], [place]   12/24/2020, 10:49 AM

## 2020-12-25 LAB — CBC WITH DIFFERENTIAL/PLATELET
Basophils Absolute: 0 10*3/uL (ref 0.0–0.2)
Basos: 0 %
EOS (ABSOLUTE): 0.3 10*3/uL (ref 0.0–0.4)
Eos: 3 %
Hematocrit: 38.1 % (ref 34.0–46.6)
Hemoglobin: 12 g/dL (ref 11.1–15.9)
Immature Grans (Abs): 0 10*3/uL (ref 0.0–0.1)
Immature Granulocytes: 0 %
Lymphocytes Absolute: 2 10*3/uL (ref 0.7–3.1)
Lymphs: 20 %
MCH: 25.8 pg — ABNORMAL LOW (ref 26.6–33.0)
MCHC: 31.5 g/dL (ref 31.5–35.7)
MCV: 82 fL (ref 79–97)
Monocytes Absolute: 0.5 10*3/uL (ref 0.1–0.9)
Monocytes: 5 %
Neutrophils Absolute: 7.1 10*3/uL — ABNORMAL HIGH (ref 1.4–7.0)
Neutrophils: 72 %
Platelets: 253 10*3/uL (ref 150–450)
RBC: 4.65 x10E6/uL (ref 3.77–5.28)
RDW: 13 % (ref 11.7–15.4)
WBC: 9.9 10*3/uL (ref 3.4–10.8)

## 2020-12-25 LAB — COMPREHENSIVE METABOLIC PANEL
ALT: 13 IU/L (ref 0–32)
AST: 13 IU/L (ref 0–40)
Albumin/Globulin Ratio: 1.3 (ref 1.2–2.2)
Albumin: 4 g/dL (ref 3.8–4.8)
Alkaline Phosphatase: 86 IU/L (ref 44–121)
BUN/Creatinine Ratio: 17 (ref 12–28)
BUN: 11 mg/dL (ref 8–27)
Bilirubin Total: 0.4 mg/dL (ref 0.0–1.2)
CO2: 26 mmol/L (ref 20–29)
Calcium: 9.9 mg/dL (ref 8.7–10.3)
Chloride: 102 mmol/L (ref 96–106)
Creatinine, Ser: 0.64 mg/dL (ref 0.57–1.00)
GFR calc Af Amer: 111 mL/min/{1.73_m2} (ref >59)
GFR calc non Af Amer: 96 mL/min/{1.73_m2} (ref >59)
Globulin, Total: 3 g/dL (ref 1.5–4.5)
Glucose: 122 mg/dL — ABNORMAL HIGH (ref 65–99)
Potassium: 4.5 mmol/L (ref 3.5–5.2)
Sodium: 142 mmol/L (ref 134–144)
Total Protein: 7 g/dL (ref 6.0–8.5)

## 2020-12-25 LAB — BRAIN NATRIURETIC PEPTIDE: BNP: 6.3 pg/mL (ref 0.0–100.0)

## 2020-12-29 ENCOUNTER — Encounter: Payer: Self-pay | Admitting: Adult Health

## 2020-12-29 ENCOUNTER — Ambulatory Visit (INDEPENDENT_AMBULATORY_CARE_PROVIDER_SITE_OTHER): Payer: Self-pay | Admitting: Adult Health

## 2020-12-29 ENCOUNTER — Other Ambulatory Visit: Payer: Self-pay

## 2020-12-29 VITALS — BP 110/80 | HR 103 | Temp 98.4°F | Ht 64.5 in | Wt 225.6 lb

## 2020-12-29 DIAGNOSIS — J8410 Pulmonary fibrosis, unspecified: Secondary | ICD-10-CM

## 2020-12-29 DIAGNOSIS — K219 Gastro-esophageal reflux disease without esophagitis: Secondary | ICD-10-CM

## 2020-12-29 DIAGNOSIS — J9611 Chronic respiratory failure with hypoxia: Secondary | ICD-10-CM

## 2020-12-29 DIAGNOSIS — I272 Pulmonary hypertension, unspecified: Secondary | ICD-10-CM

## 2020-12-29 DIAGNOSIS — J1282 Pneumonia due to coronavirus disease 2019: Secondary | ICD-10-CM

## 2020-12-29 DIAGNOSIS — R5381 Other malaise: Secondary | ICD-10-CM

## 2020-12-29 DIAGNOSIS — U071 COVID-19: Secondary | ICD-10-CM

## 2020-12-29 NOTE — Assessment & Plan Note (Signed)
Patient has decreased oxygen demands at rest.  But continues to require increased oxygen to maintain O2 saturations with ambulation.  Suspect this was related to some postinflammatory fibrosis. Will decrease oxygen at 3 L at rest.  Continue on oxygen 8 L with activity to maintain O2 saturations greater than 88 to 90%.  Have advised patient to stop and rest to help with oxygen rebounds.  Plan  Patient Instructions  Set up for HRCT Chest  Continue on Dulera 2 puffs Twice daily , rinse after use.  Albuterol inhaler As needed   Mucinex DM Twice daily As needed  Cough/congestion  Begin Prilosec 20mg  daily before meal.  Begin Pepcid 20mg  At bedtime  .  GERD diet  Begin Delsym 2 tsp Twice daily   Continue on Oxygen 3lm at rest and 8 l/m walking  Follow up in office in 4-6 weeks with Dr. Shearon Stalls with PFTs  Please contact office for sooner follow up if symptoms do not improve or worsen or seek emergency care

## 2020-12-29 NOTE — Assessment & Plan Note (Signed)
Previous 2D echo showed pulmonary hypertension.  Repeat echo was unable to evaluate pulmonary artery pressures.  RVSP and RV size were normal.   For now continue on oxygen.  No evidence of volume overload on exam.  Plan  Patient Instructions  Set up for HRCT Chest  Continue on Dulera 2 puffs Twice daily , rinse after use.  Albuterol inhaler As needed   Mucinex DM Twice daily As needed  Cough/congestion  Begin Prilosec 20mg  daily before meal.  Begin Pepcid 20mg  At bedtime  .  GERD diet  Begin Delsym 2 tsp Twice daily   Continue on Oxygen 3lm at rest and 8 l/m walking  Follow up in office in 4-6 weeks with Dr. Shearon Stalls with PFTs  Please contact office for sooner follow up if symptoms do not improve or worsen or seek emergency care

## 2020-12-29 NOTE — Assessment & Plan Note (Signed)
Activity as tolerated.  Patient has finished PT and OT at home.  On return visit consider referral to pulmonary rehab

## 2020-12-29 NOTE — Progress Notes (Signed)
@Patient  ID: Wendy Ruiz, female    DOB: 1956/09/07, 65 y.o.   MRN: [id number]  Chief Complaint  Patient presents with  . Follow-up    Referring provider: No ref. provider found  HPI: 65 year old female former smoker with history of bipolar disorder seen for PCCM consult during hospitalization for [id number] infection complicated by severe acute hypoxic respiratory failure and COVID-pneumonia September 2021.  Patient had prolonged hospitalization September 18 through September 29, 2020.  She was treated with high flow oxygen, BiPAP support, steroids, remdesivir, Baricitinib   TEST/EVENTS :  August 30, 2020 + COVID-19 test CT chest August 30, 2020 - for PE, positive for groundglass opacities Venous Dopplers September 23 - for DVT Repeat CT chest October 3 - for PE, extensive groundglass and consolidative opacities bilaterally  2D echo September 22nd 2021 EF 60 to 65%, normal right ventricle systolic function and size.  Severely elevated pulmonary artery systolic pressure  0000000 Follow up : COVID-19 pneumonia, oxygen pendant respiratory failure Patient presents for a 63-month follow-up.  Patient was seen last visit after prolonged hospitalization for critical illness with [id number] infection complicated by acute respiratory failure and COVID-pneumonia.  During hospital stay patient required high flow oxygen, BiPAP support, steroids, remdesivir, therapeutic dose of Lovenox,Baricitinib .  She was treated with empiric antibiotics.  During hospitalization she required heated high flow oxygen up to 55 L.  She was slowly weaned of oxygen and at discharge required 4 L of oxygen at rest and 8 L of oxygen with activity. Previous CT chest was negative for PE.  Showed extensive groundglass and consolidative opacities.  Venous Dopplers were negative during hospitalization.  Echo showed preserved EF with severely elevated pulmonary artery systolic pressures. Chest x-ray last visit showed  persistent bilateral opacities.  Since last visit patient says she is starting to get better.  She is becoming more active.  She did finish occupational and physical therapy at home.  She does have a rolling walker with a bench seat.  She says her oxygen demands have decreased at rest she is able to use 3 L of oxygen and keep O2 saturations in the mid 90s.  However she continues to require increased oxygen flows and continues to have desaturations despite oxygen at 8 L.  She does feel that her activity tolerance has improved and she does feel that she is improving.  Patient was set up for pulmonary function testing however patient says she was unable to get this done because she had increased cough with congestion.  Last visit she was given prednisone taper which she feels that did help with her breathing as well.  Prior to COVID-patient says she was in good health.  Lives alone.  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  She was not on any oxygen.  And had no known lung problems.  She is currently working from home.  Lab test in the office today shows O2 saturations at 3 L is able to maintain oxygen above 90% at rest.  Patient does require high flow oxygen at 8 L walking to maintain O2 saturations.  If patient walks for persistent amount of time she does desaturate quickly.  Previous 2D echo showed preserved EF with elevated pulmonary artery pressures.  A 2D echo was repeated on December 21, 2020 that showed EF at 65 to 70%.  Mild LVH.  Normal RV SF.  Normal right ventricular size.  Was unable to measure for pulmonary artery pressure.  Patient complains of heartburn  and indigestion.  Complains that she has acid reflux that comes up into her throat.  She is not on any medications.  We discussed her GERD diet.  Allergies  Allergen Reactions  . Penicillins Rash    Immunization History  Administered Date(s) Administered  . Td 04/16/2005    Past Medical History:  Diagnosis Date   . Bipolar 1 disorder ([place])   . 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 Obesity     Tobacco History: Social History   Tobacco Use  Smoking Status Former Smoker  Smokeless Tobacco Never Used   Counseling given: Not Answered   Outpatient Medications Prior to Visit  Medication Sig Dispense Refill  . albuterol (VENTOLIN HFA) 108 (90 Base) MCG/ACT inhaler Inhale 2 puffs into the lungs every 6 (six) hours as needed for wheezing or shortness of breath. 18 g 2  . mometasone-formoterol (DULERA) 200-5 MCG/ACT AERO Inhale 2 puffs into the lungs 2 (two) times daily. 13 g 3  . chlorpheniramine-HYDROcodone (TUSSIONEX) 10-8 MG/5ML SUER Take 5 mLs by mouth every 12 (twelve) hours as needed (breakthrough cough). (Patient not taking: No sig reported) 140 mL 0  . furosemide (LASIX) 20 MG tablet 1 daily for 5 days prn leg swelling (Patient not taking: Reported on 12/29/2020) 20 tablet 1  . OVER THE COUNTER MEDICATION Take 1 tablet by mouth every 12 (twelve) hours as needed. (Patient not taking: Reported on 12/29/2020)    . pantoprazole (PROTONIX) 40 MG tablet Take 1 tablet (40 mg total) by mouth daily at 12 noon for 21 days. 21 tablet 0  . polyethylene glycol (MIRALAX / GLYCOLAX) 17 g packet Take 17 g by mouth daily as needed. (Patient not taking: No sig reported) 14 each 0  . predniSONE (DELTASONE) 10 MG tablet 4 tabs for 2 days, then 3 tabs for 2 days, 2 tabs for 2 days, then 1 tab for 2 days, then stop (Patient not taking: Reported on 12/29/2020) 20 tablet 0  . promethazine-dextromethorphan (PROMETHAZINE-DM) 6.25-15 MG/5ML syrup Take 5 mLs by mouth 4 (four) times daily as needed for cough. (Patient not taking: No sig reported) 118 mL 0   No facility-administered medications prior to visit.     Review of Systems:   Constitutional:   No  weight loss, night sweats,  Fevers, chills +, fatigue, or  lassitude.  HEENT:   No headaches,  Difficulty swallowing,  Tooth/dental problems, or  Sore throat,                 No sneezing, itching, ear ache, nasal congestion, post nasal drip,   CV:  No chest pain,  Orthopnea, PND, swelling in lower extremities, anasarca, dizziness, palpitations, syncope.   GI  No heartburn, indigestion, abdominal pain, nausea, vomiting, diarrhea, change in bowel habits, loss of appetite, bloody stools.   Resp:    No chest wall deformity  Skin: no rash or lesions.  GU: no dysuria, change in color of urine, no urgency or frequency.  No flank pain, no hematuria   MS:  No joint pain or swelling.  No decreased range of motion.  No back pain.    Physical Exam  BP 110/80 (BP Location: Left Arm, Patient Position: Sitting, Cuff Size: Large)   Pulse (!) 103   Temp 98.4 F (36.9 C) (Temporal)   Ht 5' 4.5" (1.638 m)   Wt 225 lb 9.6 oz (102.3 kg)   SpO2 96%   BMI 38.13 kg/m   GEN: A/Ox3; pleasant , NAD,  on O2   HEENT:  [place]/AT,   NOSE-clear, THROAT-clear, no lesions, no postnasal drip or exudate noted.   NECK:  Supple w/ fair ROM; no JVD; normal carotid impulses w/o bruits; no thyromegaly or nodules palpated; no lymphadenopathy.    RESP  Clear  P & A; w/o, wheezes/ rales/ or rhonchi. no accessory muscle use, no dullness to percussion  CARD:  RRR, no m/r/g, no peripheral edema, pulses intact, no cyanosis or clubbing.  GI:   Soft & nt; nml bowel sounds; no organomegaly or masses detected.   Musco: Warm bil, no deformities or joint swelling noted.   Neuro: alert, no focal deficits noted.    Skin: Warm, no lesions or rashes    Lab Results:  CBC    Component Value Date/Time   WBC 9.9 12/24/2020 1110   WBC 13.7 (H) 10/26/2020 1616   RBC 4.65 12/24/2020 1110   RBC 4.38 10/26/2020 1616   HGB 12.0 12/24/2020 1110   HCT 38.1 12/24/2020 1110   PLT 253 12/24/2020 1110   MCV 82 12/24/2020 1110   MCH 25.8 (L) 12/24/2020 1110   MCH 26.4 09/24/2020 0906   MCHC 31.5 12/24/2020 1110   MCHC 30.6 10/26/2020 1616   RDW 13.0 12/24/2020 1110   LYMPHSABS 2.0 12/24/2020 1110    MONOABS 0.8 10/26/2020 1616   EOSABS 0.3 12/24/2020 1110   BASOSABS 0.0 12/24/2020 1110    BMET    Component Value Date/Time   NA 142 12/24/2020 1110   K 4.5 12/24/2020 1110   CL 102 12/24/2020 1110   CO2 26 12/24/2020 1110   GLUCOSE 122 (H) 12/24/2020 1110   GLUCOSE 117 (H) 10/26/2020 1616   BUN 11 12/24/2020 1110   CREATININE 0.64 12/24/2020 1110   CALCIUM 9.9 12/24/2020 1110   GFRNONAA 96 12/24/2020 1110   GFRNONAA >60 09/29/2020 0218   GFRAA 111 12/24/2020 1110    BNP    Component Value Date/Time   BNP 6.3 12/24/2020 1110   BNP 28.3 09/20/2020 0500    ProBNP    Component Value Date/Time   PROBNP 83.0 10/26/2020 1616    Imaging: ECHOCARDIOGRAM COMPLETE  Result Date: 12/21/2020    ECHOCARDIOGRAM REPORT   Patient Name:   FAELYN SPEER Date of Exam: 12/21/2020 Medical Rec #:  [id number]           Height:       64.5 in Accession #:    [id number]          Weight:       223.8 lb Date of Birth:  1956/02/18           BSA:          2.063 m Patient Age:    17 years            BP:           110/70 mmHg Patient Gender: F                   HR:           85 bpm. Exam Location:  [facility] Procedure: 2D Echo, Cardiac Doppler and Color Doppler Indications:    I27.20 Pulmonary Hypertension  History:        Patient has prior history of Echocardiogram examinations.                 Obesity. Cough. Hyperglycemia. Right flank pain. Chronic  respiratory failure.  Sonographer:    Diamond Nickel RCS Referring Phys: [place]  1. Left ventricular ejection fraction, by estimation, is 65 to 70%. The left ventricle has normal function. The left ventricle has no regional wall motion abnormalities. There is mild concentric left ventricular hypertrophy. Left ventricular diastolic parameters were normal.  2. Right ventricular systolic function is normal. The right ventricular size is normal. Tricuspid regurgitation signal is inadequate for assessing PA pressure.   3. The mitral valve is grossly normal. No evidence of mitral valve regurgitation.  4. The aortic valve is tricuspid. There is mild calcification of the aortic valve. Aortic valve regurgitation is not visualized. No aortic stenosis is present.  5. The inferior vena cava is normal in size with greater than 50% respiratory variability, suggesting right atrial pressure of 3 mmHg. Comparison(s): A prior study was performed on 09/01/2020. Decrease in tricuspid spectral Doppler: unable to assess for PASP in this study. FINDINGS  Left Ventricle: Left ventricular ejection fraction, by estimation, is 65 to 70%. The left ventricle has normal function. The left ventricle has no regional wall motion abnormalities. The left ventricular internal cavity size was normal in size. There is  mild concentric left ventricular hypertrophy. Left ventricular diastolic parameters were normal. Right Ventricle: The right ventricular size is normal. No increase in right ventricular wall thickness. Right ventricular systolic function is normal. Tricuspid regurgitation signal is inadequate for assessing PA pressure. Left Atrium: Left atrial size was normal in size. Right Atrium: Right atrial size was normal in size. Pericardium: There is no evidence of pericardial effusion. Mitral Valve: The mitral valve is grossly normal. No evidence of mitral valve regurgitation. Tricuspid Valve: The tricuspid valve is grossly normal. Tricuspid valve regurgitation is not demonstrated. Aortic Valve: The aortic valve is tricuspid. There is mild calcification of the aortic valve. Aortic valve regurgitation is not visualized. No aortic stenosis is present. Aortic valve mean gradient measures 9.0 mmHg. Aortic valve peak gradient measures 18.0 mmHg. Aortic valve area, by VTI measures 2.26 cm. Pulmonic Valve: The pulmonic valve was not well visualized. Pulmonic valve regurgitation is not visualized. Aorta: The ascending aorta was not well visualized and the aortic  root is normal in size and structure. Venous: The inferior vena cava is normal in size with greater than 50% respiratory variability, suggesting right atrial pressure of 3 mmHg. IAS/Shunts: The atrial septum is grossly normal.  LEFT VENTRICLE PLAX 2D LVIDd:         3.90 cm  Diastology LVIDs:         2.20 cm  LV e' medial:    9.36 cm/s LV PW:         1.00 cm  LV E/e' medial:  7.8 LV IVS:        1.10 cm  LV e' lateral:   11.60 cm/s LVOT diam:     1.80 cm  LV E/e' lateral: 6.3 LV SV:         81 LV SV Index:   39 LVOT Area:     2.54 cm  RIGHT VENTRICLE RV Basal diam:  2.80 cm RV S prime:     12.30 cm/s TAPSE (M-mode): 1.8 cm LEFT ATRIUM             Index       RIGHT ATRIUM          Index LA diam:        3.30 cm 1.60 cm/m  RA Area:  8.38 cm LA Vol (A2C):   45.7 ml 22.15 ml/m RA Volume:   13.60 ml 6.59 ml/m LA Vol (A4C):   55.2 ml 26.76 ml/m LA Biplane Vol: 51.0 ml 24.72 ml/m  AORTIC VALVE AV Area (Vmax):    2.00 cm AV Area (Vmean):   1.65 cm AV Area (VTI):     2.26 cm AV Vmax:           212.00 cm/s AV Vmean:          145.000 cm/s AV VTI:            0.357 m AV Peak Grad:      18.0 mmHg AV Mean Grad:      9.0 mmHg LVOT Vmax:         167.00 cm/s LVOT Vmean:        93.900 cm/s LVOT VTI:          0.317 m LVOT/AV VTI ratio: 0.89  AORTA Ao Root diam: 2.70 cm MITRAL VALVE MV Area (PHT): 5.02 cm    SHUNTS MV Decel Time: 151 msec    Systemic VTI:  0.32 m MV E velocity: 73.30 cm/s  Systemic Diam: 1.80 cm MV A velocity: 87.80 cm/s MV E/A ratio:  0.83 Rudean Haskell MD Electronically signed by Rudean Haskell MD Signature Date/Time: 12/21/2020/4:19:39 PM    Final       No flowsheet data found.  No results found for: NITRICOXIDE      Assessment & Plan:   Postinflammatory pulmonary fibrosis ([place]) Patient is clinically making some improvement.  Suspect she has a component of postinflammatory pulmonary fibrosis.  We will check a high-resolution CT chest since we are 4 months post her initial acute  infection. Would like for her to return for pulmonary function testing. For now we will continue on current regimen.  Advance activity as tolerated.  And continue on oxygen therapy. On return visit consider pulmonary rehab referral  Plan  Patient Instructions  Set up for HRCT Chest  Continue on Dulera 2 puffs Twice daily , rinse after use.  Albuterol inhaler As needed   Mucinex DM Twice daily As needed  Cough/congestion  Begin Prilosec 20mg  daily before meal.  Begin Pepcid 20mg  At bedtime  .  GERD diet  Begin Delsym 2 tsp Twice daily   Continue on Oxygen 3lm at rest and 8 l/m walking  Follow up in office in 4-6 weeks with Dr. Shearon Stalls with PFTs  Please contact office for sooner follow up if symptoms do not improve or worsen or seek emergency care         Chronic respiratory failure with hypoxia [facility]) Patient has decreased oxygen demands at rest.  But continues to require increased oxygen to maintain O2 saturations with ambulation.  Suspect this was related to some postinflammatory fibrosis. Will decrease oxygen at 3 L at rest.  Continue on oxygen 8 L with activity to maintain O2 saturations greater than 88 to 90%.  Have advised patient to stop and rest to help with oxygen rebounds.  Plan  Patient Instructions  Set up for HRCT Chest  Continue on Dulera 2 puffs Twice daily , rinse after use.  Albuterol inhaler As needed   Mucinex DM Twice daily As needed  Cough/congestion  Begin Prilosec 20mg  daily before meal.  Begin Pepcid 20mg  At bedtime  .  GERD diet  Begin Delsym 2 tsp Twice daily   Continue on Oxygen 3lm at rest and 8 l/m walking  Follow up in  office in 4-6 weeks with Dr. Shearon Stalls with PFTs  Please contact office for sooner follow up if symptoms do not improve or worsen or seek emergency care        Physical deconditioning Activity as tolerated.  Patient has finished PT and OT at home.  On return visit consider referral to pulmonary rehab  Pulmonary hypertension  ([place]) Previous 2D echo showed pulmonary hypertension.  Repeat echo was unable to evaluate pulmonary artery pressures.  RVSP and RV size were normal.   For now continue on oxygen.  No evidence of volume overload on exam.  Plan  Patient Instructions  Set up for HRCT Chest  Continue on Dulera 2 puffs Twice daily , rinse after use.  Albuterol inhaler As needed   Mucinex DM Twice daily As needed  Cough/congestion  Begin Prilosec 20mg  daily before meal.  Begin Pepcid 20mg  At bedtime  .  GERD diet  Begin Delsym 2 tsp Twice daily   Continue on Oxygen 3lm at rest and 8 l/m walking  Follow up in office in 4-6 weeks with Dr. Shearon Stalls with PFTs  Please contact office for sooner follow up if symptoms do not improve or worsen or seek emergency care        GERD (gastroesophageal reflux disease) GERD.  We will add in a PPI and H2 blocker.  GERD diet discussed.  Plan  Patient Instructions  Set up for HRCT Chest  Continue on Dulera 2 puffs Twice daily , rinse after use.  Albuterol inhaler As needed   Mucinex DM Twice daily As needed  Cough/congestion  Begin Prilosec 20mg  daily before meal.  Begin Pepcid 20mg  At bedtime  .  GERD diet  Begin Delsym 2 tsp Twice daily   Continue on Oxygen 3lm at rest and 8 l/m walking  Follow up in office in 4-6 weeks with Dr. Shearon Stalls with PFTs  Please contact office for sooner follow up if symptoms do not improve or worsen or seek emergency care           Rexene Edison, NP 12/29/2020

## 2020-12-29 NOTE — Assessment & Plan Note (Signed)
Patient is clinically making some improvement.  Suspect she has a component of postinflammatory pulmonary fibrosis.  We will check a high-resolution CT chest since we are 4 months post her initial acute infection. Would like for her to return for pulmonary function testing. For now we will continue on current regimen.  Advance activity as tolerated.  And continue on oxygen therapy. On return visit consider pulmonary rehab referral  Plan  Patient Instructions  Set up for HRCT Chest  Continue on Dulera 2 puffs Twice daily , rinse after use.  Albuterol inhaler As needed   Mucinex DM Twice daily As needed  Cough/congestion  Begin Prilosec 20mg  daily before meal.  Begin Pepcid 20mg  At bedtime  .  GERD diet  Begin Delsym 2 tsp Twice daily   Continue on Oxygen 3lm at rest and 8 l/m walking  Follow up in office in 4-6 weeks with Dr. Shearon Stalls with PFTs  Please contact office for sooner follow up if symptoms do not improve or worsen or seek emergency care

## 2020-12-29 NOTE — Patient Instructions (Addendum)
Set up for HRCT Chest  Continue on Dulera 2 puffs Twice daily , rinse after use.  Albuterol inhaler As needed   Mucinex DM Twice daily As needed  Cough/congestion  Begin Prilosec 20mg  daily before meal.  Begin Pepcid 20mg  At bedtime  .  GERD diet  Begin Delsym 2 tsp Twice daily   Continue on Oxygen 3lm at rest and 8 l/m walking  Follow up in office in 4-6 weeks with Dr. Shearon Stalls with PFTs  Please contact office for sooner follow up if symptoms do not improve or worsen or seek emergency care

## 2020-12-29 NOTE — Progress Notes (Signed)
Pt. Had OV today with TP, the PFT was not completed d/t her having Covid pna.  She will have PFT's prior to f/u visit.  Nothing further needed.

## 2020-12-29 NOTE — Assessment & Plan Note (Signed)
GERD.  We will add in a PPI and H2 blocker.  GERD diet discussed.  Plan  Patient Instructions  Set up for HRCT Chest  Continue on Dulera 2 puffs Twice daily , rinse after use.  Albuterol inhaler As needed   Mucinex DM Twice daily As needed  Cough/congestion  Begin Prilosec 20mg  daily before meal.  Begin Pepcid 20mg  At bedtime  .  GERD diet  Begin Delsym 2 tsp Twice daily   Continue on Oxygen 3lm at rest and 8 l/m walking  Follow up in office in 4-6 weeks with Dr. Shearon Stalls with PFTs  Please contact office for sooner follow up if symptoms do not improve or worsen or seek emergency care

## 2020-12-30 ENCOUNTER — Telehealth (INDEPENDENT_AMBULATORY_CARE_PROVIDER_SITE_OTHER): Payer: Self-pay

## 2020-12-30 NOTE — Telephone Encounter (Signed)
Patient verified date of birth. She is aware of normal labs except slightly elevated sugar which should go down since being off of steroids and with better control of sugar intake. She verbalized understanding of results. Nat Christen, CMA

## 2020-12-30 NOTE — Telephone Encounter (Signed)
-----   Message from Argentina Donovan, [place] sent at 12/30/2020  3:18 PM EST ----- Your labs are overall reassuring.  Your blood count is normal/stable.  Your BNP level was normal-this is good and a test that screens for heart failure.  Your blood sugar is a little elevated-so just decrease sugar intake and being off steroids, this should go back to normal.  Kidney function, liver function, and electrolytes are normal.  Follow up as planned.  Thanks, Freeman Caldron, PA-C

## 2021-01-06 ENCOUNTER — Telehealth: Payer: Self-pay | Admitting: Internal Medicine

## 2021-01-06 NOTE — Telephone Encounter (Signed)
Patient called because no one called her to schedule her CT scan.  Advised her that one of our PCCs would call her to get that scheduled. She verbalized understanding.  Nothing further needed.

## 2021-01-06 NOTE — Addendum Note (Signed)
Addended by: Vanessa Barbara on: 01/06/2021 03:31 PM   Modules accepted: Orders

## 2021-01-08 ENCOUNTER — Other Ambulatory Visit: Payer: Self-pay

## 2021-01-11 ENCOUNTER — Telehealth: Payer: Self-pay | Admitting: Adult Health

## 2021-01-13 NOTE — Telephone Encounter (Signed)
Rec'd unsigned forms back - will give to TP tomorrow to see if this was an error -pr

## 2021-01-18 ENCOUNTER — Ambulatory Visit
Admission: RE | Admit: 2021-01-18 | Discharge: 2021-01-18 | Disposition: A | Discharge status: Home / Self Care | Payer: Self-pay | Source: Ambulatory Visit | Attending: Adult Health | Admitting: Adult Health

## 2021-01-18 DIAGNOSIS — J8410 Pulmonary fibrosis, unspecified: Secondary | ICD-10-CM

## 2021-01-18 DIAGNOSIS — J1282 Pneumonia due to coronavirus disease 2019: Secondary | ICD-10-CM

## 2021-01-18 NOTE — Telephone Encounter (Signed)
Rec'd signed forms - faxed to [facility] at [phone]

## 2021-01-18 NOTE — Progress Notes (Signed)
Called and spoke with patient, advised of results per TP.  She verbalized understanding.  Nothing further needed.

## 2021-01-23 ENCOUNTER — Other Ambulatory Visit (HOSPITAL_COMMUNITY)
Admission: RE | Admit: 2021-01-23 | Discharge: 2021-01-23 | Disposition: A | Discharge status: Home / Self Care | Payer: Medicare Other | Source: Ambulatory Visit | Attending: Internal Medicine | Admitting: Internal Medicine

## 2021-01-23 DIAGNOSIS — Z20822 Contact with and (suspected) exposure to covid-19: Secondary | ICD-10-CM | POA: Diagnosis present

## 2021-01-24 LAB — SARS CORONAVIRUS 2 (TAT 6-24 HRS): SARS Coronavirus 2: NEGATIVE

## 2021-01-25 ENCOUNTER — Other Ambulatory Visit: Payer: Self-pay | Admitting: Internal Medicine

## 2021-01-25 DIAGNOSIS — J80 Acute respiratory distress syndrome: Secondary | ICD-10-CM

## 2021-01-27 ENCOUNTER — Encounter: Payer: Self-pay | Admitting: Internal Medicine

## 2021-01-27 ENCOUNTER — Other Ambulatory Visit: Payer: Self-pay | Admitting: Internal Medicine

## 2021-01-27 ENCOUNTER — Ambulatory Visit (INDEPENDENT_AMBULATORY_CARE_PROVIDER_SITE_OTHER): Payer: Medicare Other | Admitting: Internal Medicine

## 2021-01-27 ENCOUNTER — Other Ambulatory Visit: Payer: Self-pay

## 2021-01-27 VITALS — BP 114/76 | HR 79 | Temp 97.2°F | Ht 64.5 in | Wt 228.1 lb

## 2021-01-27 DIAGNOSIS — J9611 Chronic respiratory failure with hypoxia: Secondary | ICD-10-CM | POA: Diagnosis not present

## 2021-01-27 DIAGNOSIS — J80 Acute respiratory distress syndrome: Secondary | ICD-10-CM

## 2021-01-27 DIAGNOSIS — J8410 Pulmonary fibrosis, unspecified: Secondary | ICD-10-CM | POA: Diagnosis not present

## 2021-01-27 DIAGNOSIS — U071 COVID-19: Secondary | ICD-10-CM | POA: Diagnosis not present

## 2021-01-27 LAB — PULMONARY FUNCTION TEST
DL/VA % pred: 95 %
DL/VA: 4.64 ml/min/mmHg/L
DLCO cor % pred: 40 %
DLCO cor: 10.17 ml/min/mmHg
DLCO unc % pred: 38 %
DLCO unc: 9.7 ml/min/mmHg
FEF 25-75 Post: 1.52 L/sec
FEF 25-75 Pre: 1.57 L/sec
FEF2575-%Change-Post: -3 %
FEF2575-%Pred-Post: 78 %
FEF2575-%Pred-Pre: 80 %
FEV1-%Change-Post: 0 %
FEV1-%Pred-Post: 49 %
FEV1-%Pred-Pre: 49 %
FEV1-Post: 1.05 L
FEV1-Pre: 1.06 L
FEV1FVC-%Change-Post: -1 %
FEV1FVC-%Pred-Pre: 109 %
FEV6-%Change-Post: 1 %
FEV6-%Pred-Post: 47 %
FEV6-%Pred-Pre: 47 %
FEV6-Post: 1.23 L
FEV6-Pre: 1.21 L
FEV6FVC-%Pred-Post: 105 %
FEV6FVC-%Pred-Pre: 105 %
FVC-%Change-Post: 1 %
FVC-%Pred-Post: 45 %
FVC-%Pred-Pre: 44 %
FVC-Post: 1.23 L
FVC-Pre: 1.21 L
Post FEV1/FVC ratio: 86 %
Post FEV6/FVC ratio: 100 %
Pre FEV1/FVC ratio: 87 %
Pre FEV6/FVC Ratio: 100 %
RV % pred: 69 %
RV: 1.42 L
TLC % pred: 52 %
TLC: 2.69 L

## 2021-01-27 MED ORDER — ALBUTEROL SULFATE HFA 108 (90 BASE) MCG/ACT IN AERS
2.0000 | INHALATION_SPRAY | Freq: Four times a day (QID) | RESPIRATORY_TRACT | 5 refills | Status: DC | PRN
Start: 2021-01-27 — End: 2021-01-27

## 2021-01-27 MED ORDER — MOMETASONE FURO-FORMOTEROL FUM 200-5 MCG/ACT IN AERO
2.0000 | INHALATION_SPRAY | Freq: Two times a day (BID) | RESPIRATORY_TRACT | 5 refills | Status: DC
Start: 2021-01-27 — End: 2021-03-30

## 2021-01-27 MED FILL — ALBUTEROL SULFATE HFA 108 (: 108 (90 BAS | 25 days supply | Qty: 9 | Fill #0

## 2021-01-27 NOTE — Progress Notes (Signed)
Wendy Ruiz    [id number]    Jan 03, 1956  Primary Care Physician:Fulp, Ander Gaster, MD (Inactive) Date of Appointment: 01/27/2021 Established Patient Visit  Chief complaint:   Chief Complaint  Patient presents with  . Follow-up    Here to review the PFT done today.  She stated that her oxygen levels keep dropping.  She forgts to turn up the oxygen tank when she gets up to move around so sometimes her oxygen will drop down into the 70's.      HPI: Wendy Ruiz is a 65 y.o. woman previously unvaccinated to covid 19 who was hospitalized September 2021 with covid 19 infection treated with high flow oxygen, bipap, steroids, baricitinib and remdesevir.  She had a prolonged hospital stay for over a month. Discharged on home oxygen.   Interval Updates: Saw Tammy Parrett in hospital follow up. Had a repeat CT scan done. Here for follow up after PFTs. Still drops oxygen when she is walking around on 4LNC. Sometimes as low as 75% while wearing oxygen. She is taking albuterol inhaler and dulera, but not taking either of these regularly. [employer] is her DME. She is wondering if she can have a POC, currently only has large tanks. Dyspnea is usually only at rest.    I have reviewed the patient's family social and past medical history and updated as appropriate.   Past Medical History:  Diagnosis Date  . Bipolar 1 disorder ([place])   . 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 Obesity     History reviewed. No pertinent surgical history.  History reviewed. No pertinent family history.  Social History   Occupational History  . Not on file  Tobacco Use  . Smoking status: Former Research scientist (life sciences)  . Smokeless tobacco: Never Used  Substance and Sexual Activity  . Alcohol use: No  . Drug use: No  . Sexual activity: Not on file     Physical Exam: Blood pressure 114/76, pulse 79, temperature (!) 97.2 F (36.2 C), temperature source Tympanic, height 5' 4.5" (1.638 m), weight 228 lb 2 oz (103.5  kg), SpO2 99 %.  Gen:      No acute distress, on nasal cannula Lungs:    No increased respiratory effort, symmetric chest wall excursion, shallow inspirations, clear to auscultation bilaterally,no wheezes or crackles CV:         Regular rate and rhythm; no murmurs, rubs, or gallops.  No pedal edema   Data Reviewed: Imaging: I have personally reviewed the CT Chest Feb 2022 reviewed personally and compared to CT Chest from September 2021.   PFTs:  PFT Results Latest Ref Rng & Units 01/27/2021  FVC-Pre L 1.21  FVC-Predicted Pre % 44  FVC-Post L 1.23  FVC-Predicted Post % 45  Pre FEV1/FVC % % 87  Post FEV1/FCV % % 86  FEV1-Pre L 1.06  FEV1-Predicted Pre % 49  FEV1-Post L 1.05  DLCO uncorrected ml/min/mmHg 9.70  DLCO UNC% % 38  DLCO corrected ml/min/mmHg 10.17  DLCO COR %Predicted % 40  DLVA Predicted % 95  TLC L 2.69  TLC % Predicted % 52  RV % Predicted % 69   I have personally reviewed the patient's PFTs and there is severe restriction to ventilation and severe reduction in diffusion capacity  Labs:  Lab Results  Component Value Date   WBC 9.9 12/24/2020   HGB 12.0 12/24/2020   HCT 38.1 12/24/2020   MCV 82 12/24/2020   PLT  253 12/24/2020     Immunization status: Immunization History  Administered Date(s) Administered  . Td 04/16/2005    Assessment:  Chronic hypoxemic respiratory failure Covid 19 Pulmonary Fibrosis  Plan/Recommendations:  Reviewed PFTs and CT scan with the patient and her daughter. She is interested in [place] but unfortunately those only cover up to [facility]. Repeat ambulatory desaturation study today and she desaturated to the 70s while walking with 5LNC on POC. Will continue current oxygen prescription 3-4LNC at rest with [facility] with ambulation. Discussed referral to pulmonary rehabilitation.  I am worried most of this will be permanent and not reversible based on CT appearance. She would like to remain hopeful for improvement.  I recommend starting  ICS-LABA BID and use albuterol prn.    Return to Care: Return in about 3 months (around 04/26/2021).   Lenice Llamas, MD Pulmonary and [place]

## 2021-01-27 NOTE — Progress Notes (Signed)
PFT done today. 

## 2021-01-27 NOTE — Patient Instructions (Signed)
The patient should have follow up scheduled with myself in 3 months.   I am referring you to pulmonary rehabilitation. This is the exercise program for patients with lung disease at [facility]. You will be hearing from them.  Start taking dulera twice a day. Can take albuterol in between as needed.  Unfortunately your oxygen levels dropped too low for a portable concentrator today, so I can't prescribe this.  We can always retest at the next visit to see if you need less oxygen.

## 2021-01-29 ENCOUNTER — Other Ambulatory Visit: Payer: Self-pay | Admitting: Internal Medicine

## 2021-01-29 MED FILL — !SYMBICORT 160-4.5 MCG INH: 160-4.5 | 30 days supply | Qty: 1 | Fill #0

## 2021-02-04 ENCOUNTER — Encounter (HOSPITAL_COMMUNITY): Payer: Self-pay | Admitting: *Deleted

## 2021-02-04 NOTE — Progress Notes (Signed)
Received referral from Dr. Shearon Stalls for this pt to participate in pulmonary rehab with the the diagnosis of Postinflammatory Pulmonary Fibrosis. Pt with long hospitalization for Covid. Clinical review of pt follow up appt on 2/16 Pulmonary office note.  Pt with Covid Risk Score - 2. Pt appropriate for scheduling for Pulmonary rehab.  Will forward to support staff for scheduling and verification of insurance eligibility/benefits with pt consent. Cherre Huger, BSN Cardiac and Training and development officer

## 2021-02-11 ENCOUNTER — Telehealth (HOSPITAL_COMMUNITY): Payer: Self-pay | Admitting: Family Medicine

## 2021-02-18 ENCOUNTER — Telehealth: Payer: Self-pay | Admitting: Internal Medicine

## 2021-02-18 NOTE — Telephone Encounter (Signed)
Sorry to hear this , would rec saline nasal /gel As needed    For face - can use otc facial moisturizer such as aveeno or other dry skin facial lotions   Please contact office for sooner follow up if symptoms do not improve or worsen or seek emergency care

## 2021-02-18 NOTE — Telephone Encounter (Signed)
Called and spoke with pt who states her face has been very dry due to her oxygen. Pt wears 4L at rest and 8L with exertion.  Pt would like to know if there could be anything recommended that could help with the dry skin on her face. Tammy, please advise.

## 2021-02-18 NOTE — Telephone Encounter (Signed)
Call returned to patient, confirmed DOB. Made aware of TP recommendations. Voiced understanding.   Nothing further needed at this time.

## 2021-03-12 ENCOUNTER — Ambulatory Visit: Payer: Self-pay | Admitting: Internal Medicine

## 2021-03-17 ENCOUNTER — Telehealth: Payer: Self-pay | Admitting: Internal Medicine

## 2021-03-17 NOTE — Telephone Encounter (Signed)
Called and spoke with pt who states she wants to try to see if she can qualify for a POC. Stated to pt on 10/26/20, we had documented that she was requiring 4L O2 at rest and was requiring 8L O2 with ambulation in order to keep her sats stable.  Pt said that she does not feel like she requires that much O2 anymore and even stated that she has been using less O2.  Pt is scheduled for a f/u with Dr. Shearon Stalls 5/5 but she is wanting to see if it would be okay for her to have a visit scheduled to come in to the office for a qualifying walk prior to that appt. If it is okay to get pt in for a sooner appt for the walk, we should just move pt's OV up sooner if able as pt's OV will need to be at least 30-days from walk if she would be suitable to receive a POC.  Dr. Shearon Stalls, please advise on this.

## 2021-03-17 NOTE — Telephone Encounter (Signed)
Is she in pulmonary rehab? I made a referral.  They could do it there, right? Otherwise can she just do a nurse visit for ambulatory desat study?

## 2021-03-17 NOTE — Telephone Encounter (Signed)
ATC patient x1.  Left vm to return call.

## 2021-03-18 NOTE — Telephone Encounter (Signed)
Called and spoke with patient. She stated that she is not scheduled to attend pulm rehab until May. She was hoping to have a POC before then. She has been scheduled to see TP on 04/25 at 930am to have a walk during her OV for the POC. Will cancel the OV with ND in May.   Nothing further needed.

## 2021-03-25 ENCOUNTER — Telehealth (HOSPITAL_COMMUNITY): Payer: Self-pay

## 2021-03-25 NOTE — Telephone Encounter (Signed)
Called patient to see if she was interested in participating in the Pulmonary Rehab Program. Patient stated yes. Patient will come in for orientation on 04/26/2021@9 :00am and will attend the 10:15am exercise class.  Tourist information centre manager.

## 2021-03-30 ENCOUNTER — Other Ambulatory Visit: Payer: Self-pay

## 2021-03-30 ENCOUNTER — Other Ambulatory Visit: Payer: Self-pay | Admitting: Internal Medicine

## 2021-03-30 ENCOUNTER — Telehealth: Payer: Self-pay | Admitting: Adult Health

## 2021-03-30 DIAGNOSIS — J8410 Pulmonary fibrosis, unspecified: Secondary | ICD-10-CM

## 2021-03-30 MED ORDER — MOMETASONE FURO-FORMOTEROL FUM 200-5 MCG/ACT IN AERO
2.0000 | INHALATION_SPRAY | Freq: Two times a day (BID) | RESPIRATORY_TRACT | 5 refills | Status: DC
  Filled 2021-03-30: qty 13, 30d supply, fill #0

## 2021-03-30 NOTE — Telephone Encounter (Signed)
I called and spoke with patient in regards to refill on dulera. I sent in [place] to preferred pharmacy and patient verbalized understanding. Nothing further was needed.

## 2021-03-31 ENCOUNTER — Other Ambulatory Visit: Payer: Self-pay

## 2021-04-05 ENCOUNTER — Other Ambulatory Visit: Payer: Self-pay

## 2021-04-05 ENCOUNTER — Ambulatory Visit (INDEPENDENT_AMBULATORY_CARE_PROVIDER_SITE_OTHER): Payer: Self-pay | Admitting: Adult Health

## 2021-04-05 ENCOUNTER — Encounter: Payer: Self-pay | Admitting: Adult Health

## 2021-04-05 ENCOUNTER — Telehealth: Payer: Self-pay | Admitting: Adult Health

## 2021-04-05 DIAGNOSIS — J9611 Chronic respiratory failure with hypoxia: Secondary | ICD-10-CM

## 2021-04-05 DIAGNOSIS — J8410 Pulmonary fibrosis, unspecified: Secondary | ICD-10-CM

## 2021-04-05 DIAGNOSIS — R5381 Other malaise: Secondary | ICD-10-CM

## 2021-04-05 NOTE — Addendum Note (Signed)
Addended by: Geanie Logan on: 04/05/2021 11:02 AM   Modules accepted: Orders

## 2021-04-05 NOTE — Addendum Note (Signed)
Addended by: Geanie Logan on: 04/05/2021 10:22 AM   Modules accepted: Orders

## 2021-04-05 NOTE — Assessment & Plan Note (Signed)
Decrease O2 demands  Plan  Patient Instructions  Continue on Dulera 2 puffs Twice daily , rinse after use.  Albuterol inhaler As needed   Mucinex DM Twice daily As needed  Cough/congestion  Zyrtec 10mg  At bedtime  As needed  Allergies  Decrease Oxygen 2lm at rest and 4 l/m walking (continuous flow)  Order for POC device  On POC, use 5l/m Oxygen .  Follow up with Pulmonary Rehab next month as planned.  Follow up in office in 2-3 with Dr. Shearon Stalls or Faigy Stretch NP and As needed   Please contact office for sooner follow up if symptoms do not improve or worsen or seek emergency care

## 2021-04-05 NOTE — Assessment & Plan Note (Signed)
Healthy weight loss 

## 2021-04-05 NOTE — Telephone Encounter (Signed)
ATC patient unable to reach LM to call back office (x1)  

## 2021-04-05 NOTE — Patient Instructions (Signed)
Continue on Dulera 2 puffs Twice daily , rinse after use.  Albuterol inhaler As needed   Mucinex DM Twice daily As needed  Cough/congestion  Zyrtec 10mg  At bedtime  As needed  Allergies  Decrease Oxygen 2lm at rest and 4 l/m walking (continuous flow)  Order for POC device  On POC, use 5l/m Oxygen .  Follow up with Pulmonary Rehab next month as planned.  Follow up in office in 2-3 with Dr. Shearon Stalls or Demia Viera NP and As needed   Please contact office for sooner follow up if symptoms do not improve or worsen or seek emergency care

## 2021-04-05 NOTE — Progress Notes (Signed)
@Patient  ID: Wendy Ruiz, female    DOB: 04-Dec-1956, 65 y.o.   MRN: [id number]  Chief Complaint  Patient presents with  . Follow-up    Referring provider: No ref. provider found  HPI: 65 year old female former smoker with history of bipolar disorder seen for PCCM consult during hospitalization September 2021 for FUXNA-[id number] infection complicated by severe acute hypoxic respiratory failure and COVID-pneumonia.  Patient had a prolonged hospitalization September 18 through September 29, 2020.  She required high flow oxygen, BiPAP support, steroids, remdesivir and Baricitinib .   SH : Lives alone.  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  TEST/EVENTS :  August 30, 2020 + COVID-19 test CT chest August 30, 2020 - for PE, positive for groundglass opacities Venous Dopplers September 23 - for DVT Repeat CT chest October 3 - for PE, extensive groundglass and consolidative opacities bilaterally 2D echo September 22nd 2021 EF 60 to 65%, normal right ventricle systolic function and size. Severely elevated pulmonary artery systolic pressure 2D echo December 21, 2020 EF 65 to 70%, mild LVH, right ventricular systolic function normal.  RV size is normal.  Unable to assess PA pressure.  Left ventricular diastolic parameters were normal. High-resolution CT chest January 18, 2021 showed interval resolution of acute airspace disease.  Tubular bronchiectasis throughout and traction bronchiectasis in the bilateral upper lobes.  There is an apical predominant pattern of bronchiectasis consolidation and architectural distortion.  These findings are new compared to CT October 2021.  Felt to be consistent with a postinfectious or inflammatory fibrosis following COVID-pneumonia.  PFTs January 28, 2020 showed FEV1 at 49%, ratio 86, FVC 45%, no significant bronchodilator response, DLCO 38%.  04/05/2021 Follow up ; Post Inflammatory Fibrosis (Covid 19 08/2020) , O2 RF  Patient returns for a 22-month  follow-up.  Patient is being followed after severe critical illness in September 2021 with [id number] infection complicated by COVID-pneumonia and severe acute hypoxic respiratory failure.  She had a prolonged hospitalization and required high flow oxygen, BiPAP support, steroids, remdesivir and Baricitinib.  She has been making slow progress over the last several months.  She was discharged on oxygen requiring 4 L of oxygen at rest and up to 8 L of oxygen with activity.  Follow-up pulmonary function testing in February showed severe restriction with an FEV1 at 49% FVC at 45%.  The DLCO was at 38%.  Follow-up CT chest showed postinflammatory fibrosis changes with an apical predominance.  Last visit she wanted to change over to a portable oxygen concentrator to help her with mobility.  Unfortunately she continued to desat on POC.  Today O2 saturations were 84% on room air.  She required 5 L of oxygen on portable oxygen concentrator to maintain O2 saturations greater than 90%.  This is an improvement since last visit.  Since last visit patient says she is feeling better, feels she is making progress, slowly getting better. Feels good right now. Has minimal cough . Remains on Dulera Twice daily  .   Now able to go to stores with oxygen tanks  to walk some but runs out of oxygen quickly. Needs POC to help with independence .  Marland Kitchen   Referred to Pulmonary Rehab. Going in 04/25/21.  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 Working virtually from home.  Has not started driving yet.  Right now is on 4l/m O2 . Today in office was able to turn down O2 at 2l/m at rest and maintain O2 sats >90%.  Had some  GERD last visit , says it has resolved.   Declines Covid vaccine, patient education given .   Allergies  Allergen Reactions  . Penicillins Rash    Immunization History  Administered Date(s) Administered  . Td 04/16/2005    Past Medical History:  Diagnosis Date  . Bipolar 1 disorder ([place])   . Chronic  pain due to trauma   . Obesity     Tobacco History: Social History   Tobacco Use  Smoking Status Former Smoker  . Types: Cigarettes  . Quit date: 22  . Years since quitting: 42.3  Smokeless Tobacco Never Used   Counseling given: Not Answered   Outpatient Medications Prior to Visit  Medication Sig Dispense Refill  . albuterol (VENTOLIN HFA) 108 (90 Base) MCG/ACT inhaler INHALE 2 PUFFS INTO THE LUNGS EVERY 6 (SIX) HOURS AS NEEDED FOR WHEEZING OR SHORTNESS OF BREATH. 8.5 g 5  . mometasone-formoterol (DULERA) 200-5 MCG/ACT AERO Inhale 2 puffs into the lungs 2 (two) times daily. 13 g 5  . polyethylene glycol powder (GLYCOLAX/MIRALAX) 17 GM/SCOOP powder DISSOLVE 1 CAPFUL IN WATER AND TAKE BY MOUTH DAILY AS NEEDED. 238 g 0  . potassium chloride SA (KLOR-CON) 20 MEQ tablet TAKE 1 TABLET (20 MEQ) BY MOUTH DAILY FOR 7 DAYS. 7 tablet 0  . budesonide-formoterol (SYMBICORT) 160-4.5 MCG/ACT inhaler INHALE 2 PUFFS BY MOUTH IN THE MORNING AND AGAIN AT NIGHT. (Patient not taking: Reported on 04/05/2021) 1 g 0   No facility-administered medications prior to visit.     Review of Systems:   Constitutional:   No  weight loss, night sweats,  Fevers, chills, + fatigue, or  lassitude.  HEENT:   No headaches,  Difficulty swallowing,  Tooth/dental problems, or  Sore throat,                No sneezing, itching, ear ache, nasal congestion, post nasal drip,   CV:  No chest pain,  Orthopnea, PND, swelling in lower extremities, anasarca, dizziness, palpitations, syncope.   GI  No heartburn, indigestion, abdominal pain, nausea, vomiting, diarrhea, change in bowel habits, loss of appetite, bloody stools.   Resp:  No chest wall deformity  Skin: no rash or lesions.  GU: no dysuria, change in color of urine, no urgency or frequency.  No flank pain, no hematuria   MS:  No joint pain or swelling.  No decreased range of motion.  No back pain.    Physical Exam  BP 126/88 (BP Location: Left Arm, Cuff  Size: Normal)   Pulse 93   Temp 98.4 F (36.9 C) (Temporal)   Ht 5' 4.5" (1.638 m)   Wt 229 lb (103.9 kg)   SpO2 100% Comment: 4L O2  BMI 38.70 kg/m   GEN: A/Ox3; pleasant , NAD, well nourished on O2    HEENT:  [place]/AT,   NOSE-clear, THROAT-clear, no lesions, no postnasal drip or exudate noted.   NECK:  Supple w/ fair ROM; no JVD; normal carotid impulses w/o bruits; no thyromegaly or nodules palpated; no lymphadenopathy.    RESP  Clear  P & A; w/o, wheezes/ rales/ or rhonchi. no accessory muscle use, no dullness to percussion  CARD:  RRR, no m/r/g, no peripheral edema, pulses intact, no cyanosis or clubbing.  GI:   Soft & nt; nml bowel sounds; no organomegaly or masses detected.   Musco: Warm bil, no deformities or joint swelling noted.   Neuro: alert, no focal deficits noted.    Skin: Warm, no lesions or rashes  Lab Results:  CBC    Component Value Date/Time   WBC 9.9 12/24/2020 1110   WBC 13.7 (H) 10/26/2020 1616   RBC 4.65 12/24/2020 1110   RBC 4.38 10/26/2020 1616   HGB 12.0 12/24/2020 1110   HCT 38.1 12/24/2020 1110   PLT 253 12/24/2020 1110   MCV 82 12/24/2020 1110   MCH 25.8 (L) 12/24/2020 1110   MCH 26.4 09/24/2020 0906   MCHC 31.5 12/24/2020 1110   MCHC 30.6 10/26/2020 1616   RDW 13.0 12/24/2020 1110   LYMPHSABS 2.0 12/24/2020 1110   MONOABS 0.8 10/26/2020 1616   EOSABS 0.3 12/24/2020 1110   BASOSABS 0.0 12/24/2020 1110    BMET    Component Value Date/Time   NA 142 12/24/2020 1110   K 4.5 12/24/2020 1110   CL 102 12/24/2020 1110   CO2 26 12/24/2020 1110   GLUCOSE 122 (H) 12/24/2020 1110   GLUCOSE 117 (H) 10/26/2020 1616   BUN 11 12/24/2020 1110   CREATININE 0.64 12/24/2020 1110   CALCIUM 9.9 12/24/2020 1110   GFRNONAA 96 12/24/2020 1110   GFRNONAA >60 09/29/2020 0218   GFRAA 111 12/24/2020 1110    BNP    Component Value Date/Time   BNP 6.3 12/24/2020 1110   BNP 28.3 09/20/2020 0500    ProBNP    Component Value Date/Time    PROBNP 83.0 10/26/2020 1616    Imaging: No results found.    PFT Results Latest Ref Rng & Units 01/27/2021  FVC-Pre L 1.21  FVC-Predicted Pre % 44  FVC-Post L 1.23  FVC-Predicted Post % 45  Pre FEV1/FVC % % 87  Post FEV1/FCV % % 86  FEV1-Pre L 1.06  FEV1-Predicted Pre % 49  FEV1-Post L 1.05  DLCO uncorrected ml/min/mmHg 9.70  DLCO UNC% % 38  DLCO corrected ml/min/mmHg 10.17  DLCO COR %Predicted % 40  DLVA Predicted % 95  TLC L 2.69  TLC % Predicted % 52  RV % Predicted % 69    No results found for: NITRICOXIDE      Assessment & Plan:   Postinflammatory pulmonary fibrosis (HCC) Clinically making slow progress with decrease O2 demands Now qualifies for POC device  Will decrease continuous flow O2 as well.  Pulmonary rehab   Plan  Patient Instructions  Continue on Dulera 2 puffs Twice daily , rinse after use.  Albuterol inhaler As needed   Mucinex DM Twice daily As needed  Cough/congestion  Zyrtec 10mg  At bedtime  As needed  Allergies  Decrease Oxygen 2lm at rest and 4 l/m walking (continuous flow)  Order for POC device  On POC, use 5l/m Oxygen .  Follow up with Pulmonary Rehab next month as planned.  Follow up in office in 2-3 with Dr. Shearon Stalls or Verania Salberg NP and As needed   Please contact office for sooner follow up if symptoms do not improve or worsen or seek emergency care        Chronic respiratory failure with hypoxia ([place]) Decrease O2 demands  Plan  Patient Instructions  Continue on Dulera 2 puffs Twice daily , rinse after use.  Albuterol inhaler As needed   Mucinex DM Twice daily As needed  Cough/congestion  Zyrtec 10mg  At bedtime  As needed  Allergies  Decrease Oxygen 2lm at rest and 4 l/m walking (continuous flow)  Order for POC device  On POC, use 5l/m Oxygen .  Follow up with Pulmonary Rehab next month as planned.  Follow up in office in 2-3 with Dr.  Shearon Stalls or Reather Steller NP and As needed   Please contact office for sooner follow up if symptoms  do not improve or worsen or seek emergency care        Morbid obesity ([place]) Healthy weight loss   Physical deconditioning Pulmonary rehab      Rexene Edison, NP 04/05/2021

## 2021-04-05 NOTE — Assessment & Plan Note (Signed)
Clinically making slow progress with decrease O2 demands Now qualifies for POC device  Will decrease continuous flow O2 as well.  Pulmonary rehab   Plan  Patient Instructions  Continue on Dulera 2 puffs Twice daily , rinse after use.  Albuterol inhaler As needed   Mucinex DM Twice daily As needed  Cough/congestion  Zyrtec 10mg  At bedtime  As needed  Allergies  Decrease Oxygen 2lm at rest and 4 l/m walking (continuous flow)  Order for POC device  On POC, use 5l/m Oxygen .  Follow up with Pulmonary Rehab next month as planned.  Follow up in office in 2-3 with Dr. Shearon Stalls or Lindwood Mogel NP and As needed   Please contact office for sooner follow up if symptoms do not improve or worsen or seek emergency care

## 2021-04-05 NOTE — Assessment & Plan Note (Signed)
Pulmonary rehab?

## 2021-04-06 NOTE — Telephone Encounter (Signed)
Spoke with the pt  She is unhappy with price that [employer] gave her for her POC  She wants to go ahead and have her order sent to another DME to see if they can offer a better price or payment plan  PCC's, can you help with this and send to another DME? Thanks!

## 2021-04-06 NOTE — Telephone Encounter (Signed)
I have received a CMN from [employer] on pt for Dr Shearon Stalls to sign she will be back Monday to sign

## 2021-04-06 NOTE — Telephone Encounter (Signed)
Called pt and sent order to [place] [facility] per pt request.

## 2021-04-06 NOTE — Telephone Encounter (Signed)
Order sent to [facility].

## 2021-04-07 NOTE — Telephone Encounter (Signed)
Called and spoke with patient. She stated that she had received a call from [employer] stating that since she had been with [employer] for more than 45 days, she would have to pay out of pocket for her POC. She was given a quote of over $2000. She had asked to be placed on payment arrangements but she was never given a straight answer.   She did her own research and found the reviews on the [employer] machines to be discouraging. She went to [employer] and found a POC with the accessories around $670. Here is the link to the device she found: [facility] POC  I advised her that I would call [employer] to see why she would need to pay out of pocket for the POC. She verbalized understanding.   Called [employer] and spoke with Tiffany. She did state that [employer] is no longer providing POCs for patients who are already established with them. If the patient needs a POC, they will need to be setup at the same time as the original setup. Patients are now required to pay out of pocket for POCs. They do offer payment arrangements but the POC will need to be paid in full before the patient will receive it.   TP, can you look at the [place] I linked above and provide your input? Thanks!

## 2021-04-07 NOTE — Telephone Encounter (Signed)
Called and spoke with the pt and she states TP advised her to let us know once she gets her machine and we can set up appt for her to be walked on it to be sure that it's adequate.

## 2021-04-07 NOTE — Telephone Encounter (Signed)
This is not something we would know the answer to

## 2021-04-07 NOTE — Telephone Encounter (Signed)
Pt returning a phone call to see if getting a poc from [place] is an option. Pt can be reached at [phone]. Pt stated that she did not like the reviews she read about [employer] (??)

## 2021-04-07 NOTE — Telephone Encounter (Signed)
Patient does not have insurance. We discussed her options.  She would like to go ahead and proceed with the portable oxygen concentrator through [employer].  It does go up to 5 L pulsing oxygen.  She will come by our office for [place] to evaluate her on this to make sure it is working okay.

## 2021-04-14 ENCOUNTER — Telehealth: Payer: Self-pay | Admitting: Internal Medicine

## 2021-04-14 NOTE — Telephone Encounter (Signed)
Pt stated that when she was here to see TP on 04/05/21, TP told her that once she received her POC that she could come to the office and have someone from our office check it to make sure it is set up right. I did not see anything like this in TP note.  TP please advise. Thanks

## 2021-04-15 ENCOUNTER — Ambulatory Visit: Payer: Medicare Other | Admitting: Internal Medicine

## 2021-04-15 NOTE — Telephone Encounter (Signed)
Yes I did  please ask her to come by if she wants to come by around 2 today we can take a look at her right before to we can take a look at it and make sure it is working okay and make sure her oxygen levels are okay on the right level

## 2021-04-15 NOTE — Telephone Encounter (Signed)
Called and spoke to pt. Pt states she is returning the POC. She is uncomfortable with the lack of support. Pt is afraid if there are issues with the machine she will have trouble with support. Pt states she will get an inogen system instead.   Will send to TP as FYI.

## 2021-04-22 ENCOUNTER — Telehealth (HOSPITAL_COMMUNITY): Payer: Self-pay | Admitting: *Deleted

## 2021-04-26 ENCOUNTER — Encounter (HOSPITAL_COMMUNITY)
Admission: RE | Admit: 2021-04-26 | Discharge: 2021-04-26 | Disposition: A | Discharge status: Home / Self Care | Payer: Self-pay | Source: Ambulatory Visit | Attending: Physician Assistant | Admitting: Physician Assistant

## 2021-04-26 ENCOUNTER — Other Ambulatory Visit: Payer: Self-pay

## 2021-04-26 NOTE — Progress Notes (Signed)
Wendy Ruiz arrived @ pulmonary rehab for orientation/walk test today, 04/26/2021.  She had Medicare A&B when our support staff called to check her insurance 02/2021.  Today when she arrived we found that her Medicare ran out because she is now employed.  She qualifies for insurance in 1-2 weeks with her employer and will call us with that information so we can check her insurance coverage.  I did not complete the orientation today, and will reschedule her when she has insurance coverage. [phone]

## 2021-05-04 ENCOUNTER — Ambulatory Visit (HOSPITAL_COMMUNITY): Payer: Self-pay

## 2021-05-06 ENCOUNTER — Ambulatory Visit (HOSPITAL_COMMUNITY): Payer: Self-pay

## 2021-05-11 ENCOUNTER — Ambulatory Visit (HOSPITAL_COMMUNITY): Payer: Self-pay

## 2021-05-12 ENCOUNTER — Telehealth (HOSPITAL_COMMUNITY): Payer: Self-pay

## 2021-05-12 NOTE — Telephone Encounter (Signed)
Called and spoke with pt in regards to PR, pt stated she has not recv'ed her updated insurance as of yet. She is not sure when she will get it and stated that she will contact PR when she does get insurance.  Closed referral

## 2021-05-13 ENCOUNTER — Ambulatory Visit (HOSPITAL_COMMUNITY): Payer: Self-pay

## 2021-05-18 ENCOUNTER — Ambulatory Visit (HOSPITAL_COMMUNITY): Payer: Self-pay

## 2021-05-20 ENCOUNTER — Ambulatory Visit (HOSPITAL_COMMUNITY): Payer: Self-pay

## 2021-05-25 ENCOUNTER — Ambulatory Visit (HOSPITAL_COMMUNITY): Payer: Self-pay

## 2021-05-27 ENCOUNTER — Ambulatory Visit (HOSPITAL_COMMUNITY): Payer: Self-pay

## 2021-06-01 ENCOUNTER — Ambulatory Visit (HOSPITAL_COMMUNITY): Payer: Self-pay

## 2021-06-03 ENCOUNTER — Ambulatory Visit (HOSPITAL_COMMUNITY): Payer: Self-pay

## 2021-06-08 ENCOUNTER — Ambulatory Visit (HOSPITAL_COMMUNITY): Payer: Self-pay

## 2021-06-10 ENCOUNTER — Ambulatory Visit (HOSPITAL_COMMUNITY): Payer: Self-pay

## 2021-06-15 ENCOUNTER — Ambulatory Visit (HOSPITAL_COMMUNITY): Payer: Self-pay

## 2021-06-17 ENCOUNTER — Ambulatory Visit (HOSPITAL_COMMUNITY): Payer: Self-pay

## 2021-06-22 ENCOUNTER — Ambulatory Visit (HOSPITAL_COMMUNITY): Payer: Self-pay

## 2021-06-24 ENCOUNTER — Ambulatory Visit (HOSPITAL_COMMUNITY): Payer: Self-pay

## 2021-06-29 ENCOUNTER — Ambulatory Visit (HOSPITAL_COMMUNITY): Payer: Self-pay

## 2021-07-01 ENCOUNTER — Ambulatory Visit (HOSPITAL_COMMUNITY): Payer: Self-pay

## 2021-07-05 ENCOUNTER — Other Ambulatory Visit: Payer: Self-pay

## 2021-07-05 ENCOUNTER — Encounter: Payer: Self-pay | Admitting: Adult Health

## 2021-07-05 ENCOUNTER — Ambulatory Visit (INDEPENDENT_AMBULATORY_CARE_PROVIDER_SITE_OTHER): Payer: Self-pay | Admitting: Adult Health

## 2021-07-05 DIAGNOSIS — J9611 Chronic respiratory failure with hypoxia: Secondary | ICD-10-CM

## 2021-07-05 DIAGNOSIS — R5381 Other malaise: Secondary | ICD-10-CM

## 2021-07-05 DIAGNOSIS — J8410 Pulmonary fibrosis, unspecified: Secondary | ICD-10-CM

## 2021-07-05 MED ORDER — ALBUTEROL SULFATE HFA 108 (90 BASE) MCG/ACT IN AERS
2.0000 | INHALATION_SPRAY | Freq: Four times a day (QID) | RESPIRATORY_TRACT | 5 refills | Status: AC | PRN
  Filled 2021-07-05: qty 8.5, 25d supply, fill #0

## 2021-07-05 MED ORDER — MOMETASONE FURO-FORMOTEROL FUM 200-5 MCG/ACT IN AERO
2.0000 | INHALATION_SPRAY | Freq: Two times a day (BID) | RESPIRATORY_TRACT | 5 refills | Status: AC
  Filled 2021-07-05: qty 13, 30d supply, fill #0

## 2021-07-05 NOTE — Assessment & Plan Note (Signed)
Patient is to continue on oxygen to maintain O2 saturations greater than 88 to 90%.  Her oxygen demands have decreased over the last several months. She is to continue on oxygen 2 L with activity and at bedtime.  If she is using her portable oxygen concentrator will need 3 L pulsed oxygen with activity

## 2021-07-05 NOTE — Progress Notes (Signed)
$'@Patient'd$  ID: Wendy Ruiz, female    DOB: May 21, 1956, 65 y.o.   MRN: [id number]  Chief Complaint  Patient presents with   Follow-up    Referring provider: No ref. provider found  HPI: 65 year old female former smoker with history of bipolar disorder seen for pulmonary critical care consult during hospitalization September 2021 for [id number] infection complicated by severe acute hypoxic respiratory failure and COVID-pneumonia.  She had a prolonged hospitalization September 18 through October 19.  She required high flow oxygen, BiPAP support, steroids, remdesivir and Baricitinib.   Social history.  Patient lives alone.  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  Severely elevated pulmonary artery systolic pressure 2D echo December 21, 2020 EF 65 to 70%, mild LVH, right ventricular systolic function normal.  RV size is normal.  Unable to assess PA pressure.  Left ventricular diastolic parameters were normal. High-resolution CT chest January 18, 2021 showed interval resolution of acute airspace disease.  Tubular bronchiectasis throughout and traction bronchiectasis in the bilateral upper lobes.  There is an apical predominant pattern of bronchiectasis consolidation and architectural distortion.  These findings are new compared to CT October 2021.  Felt to be consistent with a postinfectious or inflammatory fibrosis following COVID-pneumonia.   PFTs January 28, 2020 showed FEV1 at 49%, ratio 86, FVC 45%, no significant bronchodilator response, DLCO 38%.  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  Patient is being followed after critical illness in September 2021 with COVID 19 infection complicated by COVID-pneumonia and severe acute respiratory failure.  Prior to admission patient had minimal medical problems with no known lung issues. She has been making slow progress over the last several months.  She was initially discharged on 4 L of oxygen at rest and 8 L with activity.  Pulmonary function testing in February showed severe restriction and diffusing defect.  Follow-up CT chest showed postinflammatory fibrosis changes with apical predominance.  Since last visit patient says she has been making slow progress and is starting to feel better.  She has improved activity tolerance.  She has back to driving.  She is also had decreased oxygen demands.  She says at home she is able to sit without oxygen and keep her O2 saturations above 90%.  On room air.  She does require 2 L of oxygen with activity at home.  Today in the office we did a walk test.  Patient required 3 L of oxygen on pulse setting to maintain O2 saturations greater than 88 to 90%.  At rest patient was able to maintain O2 saturations greater than 90% around room air. She remains on Dulera twice daily.  Has rare albuterol use. Patient was referred to pulmonary rehab last visit.  Patient says she tried to go but her insurance had very high co-pays that were not affordable. She says she is trying to be active and walks as much as she can. Patient declines COVID-vaccine.  Patient education was given Patient denies any hemoptysis chest pain orthopnea PND or increased leg swelling  Allergies  Allergen Reactions   Penicillins Rash  Immunization History  Administered Date(s) Administered   Td 04/16/2005    Past Medical History:  Diagnosis Date   Bipolar 1 disorder ([place])    Chronic pain due to trauma    Obesity     Tobacco History: Social History   Tobacco Use  Smoking Status Former   Types:  Cigarettes   Start date: 1979   Quit date: 1980   Years since quitting: 42.5  Smokeless Tobacco Never   Counseling given: Not Answered   Outpatient Medications Prior to Visit  Medication Sig Dispense Refill   albuterol (VENTOLIN HFA) 108 (90 Base) MCG/ACT inhaler INHALE 2 PUFFS INTO THE LUNGS EVERY 6 (SIX) HOURS AS NEEDED FOR WHEEZING OR SHORTNESS OF BREATH. 8.5 g 5   mometasone-formoterol (DULERA) 200-5 MCG/ACT AERO Inhale 2 puffs into the lungs 2 (two) times daily. 13 g 5   polyethylene glycol powder (GLYCOLAX/MIRALAX) 17 GM/SCOOP powder DISSOLVE 1 CAPFUL IN WATER AND TAKE BY MOUTH DAILY AS NEEDED. 238 g 0   potassium chloride SA (KLOR-CON) 20 MEQ tablet TAKE 1 TABLET (20 MEQ) BY MOUTH DAILY FOR 7 DAYS. (Patient not taking: Reported on 07/05/2021) 7 tablet 0   No facility-administered medications prior to visit.     Review of Systems:   Constitutional:   No  weight loss, night sweats,  Fevers, chills,  +fatigue, or  lassitude.  HEENT:   No headaches,  Difficulty swallowing,  Tooth/dental problems, or  Sore throat,                No sneezing, itching, ear ache, nasal congestion, post nasal drip,   CV:  No chest pain,  Orthopnea, PND, swelling in lower extremities, anasarca, dizziness, palpitations, syncope.   GI  No heartburn, indigestion, abdominal pain, nausea, vomiting, diarrhea, change in bowel habits, loss of appetite, bloody stools.   Resp:   No chest wall deformity  Skin: no rash or lesions.  GU: no dysuria, change in color of urine, no urgency or frequency.  No flank pain, no hematuria   MS:  No joint pain or swelling.  No decreased range of motion.  No back pain.    Physical Exam  BP 138/76   Pulse 82   Ht '5\' 4"'$  (1.626 m)   Wt 228 lb 6.4 oz (103.6 kg)   SpO2 98% Comment: 2-3L when sitting she use 1L  BMI 39.20 kg/m   GEN: A/Ox3; pleasant , NAD, well nourished    HEENT:  [place]/AT,   NOSE-clear, THROAT-clear, no lesions, no postnasal drip or exudate noted.  Class 2-3 MP airway   NECK:  Supple w/ fair ROM; no JVD; normal carotid impulses w/o bruits; no thyromegaly or nodules palpated; no lymphadenopathy.    RESP  Clear  P & A; w/o, wheezes/ rales/ or rhonchi. no accessory muscle use, no dullness to percussion  CARD:  RRR, no m/r/g, no peripheral edema, pulses intact, no cyanosis or clubbing.  GI:   Soft & nt; nml bowel sounds; no organomegaly or masses detected.   Musco: Warm bil, no deformities or joint swelling noted.   Neuro: alert, no focal deficits noted.    Skin: Warm, no lesions or rashes    Lab Results:    Imaging: No results found.    PFT Results Latest Ref Rng & Units 01/27/2021  FVC-Pre L 1.21  FVC-Predicted Pre % 44  FVC-Post L 1.23  FVC-Predicted Post % 45  Pre FEV1/FVC % % 87  Post FEV1/FCV % % 86  FEV1-Pre  L 1.06  FEV1-Predicted Pre % 49  FEV1-Post L 1.05  DLCO uncorrected ml/min/mmHg 9.70  DLCO UNC% % 38  DLCO corrected ml/min/mmHg 10.17  DLCO COR %Predicted % 40  DLVA Predicted % 95  TLC L 2.69  TLC % Predicted % 52  RV % Predicted % 69    No results found for: NITRICOXIDE      Assessment & Plan:   Postinflammatory pulmonary fibrosis (HCC) Patient appears stable.  She has had improvement in her activity tolerance and decreased oxygen demands.  She continues to make ongoing progress.  Will continue current regimen.  Activity is key for her.  Very unfortunate that she was not able to go to pulmonary rehab.  Plan  Patient Instructions  Continue on Dulera 2 puffs Twice daily , rinse after use.  Albuterol inhaler As needed   Mucinex DM Twice daily As needed  Cough/congestion  Zyrtec '10mg'$  At bedtime  As needed  Allergies  Decrease Oxygen 2l/m with activity and At bedtime  . Goal is to have O2 sats >88-90%.  Use 3 l/m on POC pulse oxygen with activity   Activity as tolerated.  Follow up in office in 4 months  with Dr. Shearon Stalls or Daylin Gruszka NP and As needed  (Spirometry with DLCO ) Please contact  office for sooner follow up if symptoms do not improve or worsen or seek emergency care        Physical deconditioning Advised on activity as tolerated  Morbid obesity ([place]) Healthy weight loss  Chronic respiratory failure with hypoxia ([place]) Patient is to continue on oxygen to maintain O2 saturations greater than 88 to 90%.  Her oxygen demands have decreased over the last several months. She is to continue on oxygen 2 L with activity and at bedtime.  If she is using her portable oxygen concentrator will need 3 L pulsed oxygen with activity     Rexene Edison, NP 07/05/2021

## 2021-07-05 NOTE — Addendum Note (Signed)
Addended by: Vanessa Barbara on: 07/05/2021 10:22 AM   Modules accepted: Orders

## 2021-07-05 NOTE — Patient Instructions (Addendum)
Continue on Dulera 2 puffs Twice daily , rinse after use.  Albuterol inhaler As needed   Mucinex DM Twice daily As needed  Cough/congestion  Zyrtec '10mg'$  At bedtime  As needed  Allergies  Decrease Oxygen 2l/m with activity and At bedtime  . Goal is to have O2 sats >88-90%.  Use 3 l/m on POC pulse oxygen with activity   Activity as tolerated.  Follow up in office in 4 months  with Dr. Shearon Stalls or Rithvik Orcutt NP and As needed  (Spirometry with DLCO ) Please contact office for sooner follow up if symptoms do not improve or worsen or seek emergency care

## 2021-07-05 NOTE — Assessment & Plan Note (Signed)
Advised on activity as tolerated

## 2021-07-05 NOTE — Assessment & Plan Note (Signed)
Patient appears stable.  She has had improvement in her activity tolerance and decreased oxygen demands.  She continues to make ongoing progress.  Will continue current regimen.  Activity is key for her.  Very unfortunate that she was not able to go to pulmonary rehab.  Plan  Patient Instructions  Continue on Dulera 2 puffs Twice daily , rinse after use.  Albuterol inhaler As needed   Mucinex DM Twice daily As needed  Cough/congestion  Zyrtec '10mg'$  At bedtime  As needed  Allergies  Decrease Oxygen 2l/m with activity and At bedtime  . Goal is to have O2 sats >88-90%.  Use 3 l/m on POC pulse oxygen with activity   Activity as tolerated.  Follow up in office in 4 months  with Dr. Shearon Stalls or Hyrum Shaneyfelt NP and As needed  (Spirometry with DLCO ) Please contact office for sooner follow up if symptoms do not improve or worsen or seek emergency care

## 2021-07-05 NOTE — Assessment & Plan Note (Signed)
Healthy weight loss 

## 2021-07-08 ENCOUNTER — Other Ambulatory Visit: Payer: Self-pay

## 2021-11-11 ENCOUNTER — Ambulatory Visit: Payer: Self-pay | Admitting: Adult Health

## 2021-12-20 ENCOUNTER — Other Ambulatory Visit: Payer: Self-pay | Admitting: Adult Health

## 2021-12-21 ENCOUNTER — Other Ambulatory Visit: Payer: Self-pay | Admitting: *Deleted

## 2021-12-21 DIAGNOSIS — J8410 Pulmonary fibrosis, unspecified: Secondary | ICD-10-CM

## 2021-12-21 LAB — SARS CORONAVIRUS 2 (TAT 6-24 HRS): SARS Coronavirus 2: NEGATIVE

## 2021-12-22 ENCOUNTER — Ambulatory Visit (INDEPENDENT_AMBULATORY_CARE_PROVIDER_SITE_OTHER): Payer: BC Managed Care – PPO | Admitting: Internal Medicine

## 2021-12-22 ENCOUNTER — Ambulatory Visit (INDEPENDENT_AMBULATORY_CARE_PROVIDER_SITE_OTHER): Payer: BC Managed Care – PPO | Admitting: Adult Health

## 2021-12-22 ENCOUNTER — Other Ambulatory Visit: Payer: Self-pay

## 2021-12-22 ENCOUNTER — Encounter: Payer: Self-pay | Admitting: Adult Health

## 2021-12-22 ENCOUNTER — Ambulatory Visit (INDEPENDENT_AMBULATORY_CARE_PROVIDER_SITE_OTHER): Payer: BC Managed Care – PPO

## 2021-12-22 VITALS — BP 104/74 | HR 88 | Temp 98.4°F | Ht 64.0 in | Wt 220.0 lb

## 2021-12-22 DIAGNOSIS — J9611 Chronic respiratory failure with hypoxia: Secondary | ICD-10-CM | POA: Diagnosis not present

## 2021-12-22 DIAGNOSIS — J8410 Pulmonary fibrosis, unspecified: Secondary | ICD-10-CM

## 2021-12-22 DIAGNOSIS — R5381 Other malaise: Secondary | ICD-10-CM | POA: Diagnosis not present

## 2021-12-22 LAB — PULMONARY FUNCTION TEST
DL/VA % pred: 133 %
DL/VA: 6.51 ml/min/mmHg/L
DLCO cor % pred: 74 %
DLCO cor: 18.69 ml/min/mmHg
DLCO unc % pred: 74 %
DLCO unc: 18.69 ml/min/mmHg
FEF 25-75 Pre: 2.05 L/sec
FEF2575-%Pred-Pre: 107 %
FEV1-%Pred-Pre: 69 %
FEV1-Pre: 1.47 L
FEV1FVC-%Pred-Pre: 107 %
FEV6-%Pred-Pre: 67 %
FEV6-Pre: 1.72 L
FEV6FVC-%Pred-Pre: 105 %
FVC-%Pred-Pre: 64 %
FVC-Pre: 1.72 L
Pre FEV1/FVC ratio: 85 %
Pre FEV6/FVC Ratio: 100 %

## 2021-12-22 NOTE — Patient Instructions (Signed)
Remain off Dulera  Albuterol inhaler As needed   Mucinex DM Twice daily As needed  Cough/congestion  Saline nasal spray Twice daily As needed   Zyrtec 10mg  At bedtime  As needed  Allergies  Activity as tolerated.  Chest xray today  Follow up in office in 6 months  with Dr. Shearon Stalls or Linkon Siverson NP and As needed.  Please contact office for sooner follow up if symptoms do not improve or worsen or seek emergency care

## 2021-12-22 NOTE — Patient Instructions (Addendum)
Spirometry/dlco performed today. 

## 2021-12-22 NOTE — Progress Notes (Signed)
Spirometry/dlco performed today. 

## 2021-12-22 NOTE — Progress Notes (Signed)
@Patient  ID: Wendy Ruiz, female    DOB: [birth year], 66 y.o.   MRN: [id number]  Chief Complaint  Patient presents with   Follow-up    Referring provider: No ref. provider found  HPI: 66 year old female former smoker with a history of bipolar disorder seen for pulmonary critical care consult during hospitalization September 2021 for [id number] infection complicated by severe acute hypoxic respiratory failure and COVID-pneumonia.  She had a prolonged hospitalization from September 18 through September 29, 2020.  She required high flow oxygen, BiPAP support, steroids, remdesivir and Baricitinib.   Patient lives alone.  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  Severely elevated pulmonary artery systolic pressure 2D echo December 21, 2020 EF 65 to 70%, mild LVH, right ventricular systolic function normal.  RV size is normal.  Unable to assess PA pressure.  Left ventricular diastolic parameters were normal. High-resolution CT chest January 18, 2021 showed interval resolution of acute airspace disease.  Tubular bronchiectasis throughout and traction bronchiectasis in the bilateral upper lobes.  There is an apical predominant pattern of bronchiectasis consolidation and architectural distortion.  These findings are new compared to CT October 2021.  Felt to be consistent with a postinfectious or inflammatory fibrosis following COVID-pneumonia.   PFTs January 28, 2020 showed FEV1 at 49%, ratio 86, FVC 45%, no significant bronchodilator response, DLCO 38%.   12/22/2021 Follow up : Postinflammatory fibrosis) post COVID-19 infection), oxygen  dependent respiratory failure Patient returns for a 73-month follow-up.  Since last visit patient says she is doing much better.  Has weaned herself off of all oxygen over the last 3 to 4 months.  No longer using Dulera.  Rare use of albuterol. O2 saturations at home remaining into the low to mid 90s.  On room air.  Patient stamina is improving.  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 PFT is substantially improved with FEV1 at 69%, ratio 85, FVC 64%, DLCO 74%. Does complain of some postnasal drainage at times. Overall she feels that she is doing better and moving in the right direction She declines the COVID-vaccine.    Allergies  Allergen Reactions   Penicillins Rash    Immunization History  Administered Date(s) Administered   Td 04/16/2005    Past Medical History:  Diagnosis Date   Bipolar 1 disorder ([place])    Chronic pain due to trauma    Obesity     Tobacco History: Social History   Tobacco Use  Smoking Status Former   Types: Cigarettes   Start date: 1979   Quit date: 1980   Years since quitting: 43.0  Smokeless Tobacco Never   Counseling given: Not Answered   Outpatient Medications Prior to Visit  Medication Sig Dispense Refill   albuterol (VENTOLIN HFA) 108 (90 Base) MCG/ACT inhaler INHALE 2 PUFFS INTO THE LUNGS EVERY 6 (SIX) HOURS AS NEEDED FOR WHEEZING OR SHORTNESS OF BREATH. 8.5 g 5   Ascorbic Acid (VITAMIN C) 100 MG tablet Take 100 mg  by mouth daily.     cholecalciferol (VITAMIN D3) 25 MCG (1000 UNIT) tablet Take 1,000 Units by mouth daily.     mometasone-formoterol (DULERA) 200-5 MCG/ACT AERO Inhale 2 puffs into the lungs 2 (two) times daily. 13 g 5   Multiple Vitamins-Minerals (MULTIVITAMIN WITH MINERALS) tablet Take 1 tablet by mouth daily.     Zinc Sulfate (ZINC-220 PO) Take by mouth.     potassium chloride SA  (KLOR-CON) 20 MEQ tablet TAKE 1 TABLET (20 MEQ) BY MOUTH DAILY FOR 7 DAYS. (Patient not taking: Reported on 07/05/2021) 7 tablet 0   No facility-administered medications prior to visit.     Review of Systems:   Constitutional:   No  weight loss, night sweats,  Fevers, chills,  +fatigue, or  lassitude.  HEENT:   No headaches,  Difficulty swallowing,  Tooth/dental problems, or  Sore throat,                No sneezing, itching, ear ache, nasal congestion, post nasal drip,   CV:  No chest pain,  Orthopnea, PND, swelling in lower extremities, anasarca, dizziness, palpitations, syncope.   GI  No heartburn, indigestion, abdominal pain, nausea, vomiting, diarrhea, change in bowel habits, loss of appetite, bloody stools.   Resp: .  No excess mucus, no productive cough,  No non-productive cough,  No coughing up of blood.  No change in color of mucus.  No wheezing.  No chest wall deformity  Skin: no rash or lesions.  GU: no dysuria, change in color of urine, no urgency or frequency.  No flank pain, no hematuria   MS:  No joint pain or swelling.  No decreased range of motion.  No back pain.    Physical Exam  BP 104/74 (BP Location: Left Arm, Cuff Size: Large)    Pulse 88    SpO2 91%   GEN: A/Ox3; pleasant , NAD, well nourished    HEENT:  [place]/AT,  NOSE-clear, THROAT-clear, no lesions, no postnasal drip or exudate noted.   NECK:  Supple w/ fair ROM; no JVD; normal carotid impulses w/o bruits; no thyromegaly or nodules palpated; no lymphadenopathy.    RESP  Clear  P & A; w/o, wheezes/ rales/ or rhonchi. no accessory muscle use, no dullness to percussion  CARD:  RRR, no m/r/g, no peripheral edema, pulses intact, no cyanosis or clubbing.  GI:   Soft & nt; nml bowel sounds; no organomegaly or masses detected.   Musco: Warm bil, no deformities or joint swelling noted.   Neuro: alert, no focal deficits noted.    Skin: Warm, no lesions or rashes    Lab  Results:  CBC    BNP    Imaging: No results found.    PFT Results Latest Ref Rng & Units 12/22/2021 01/27/2021  FVC-Pre L 1.72 1.21  FVC-Predicted Pre % 64 44  FVC-Post L - 1.23  FVC-Predicted Post % - 45  Pre FEV1/FVC % % 85 87  Post FEV1/FCV % % - 86  FEV1-Pre L 1.47 1.06  FEV1-Predicted Pre % 69 49  FEV1-Post L - 1.05  DLCO uncorrected ml/min/mmHg 18.69 9.70  DLCO UNC% % 74 38  DLCO corrected ml/min/mmHg 18.69 10.17  DLCO COR %Predicted % 74 40  DLVA Predicted % 133 95  TLC L - 2.69  TLC % Predicted % - 52  RV % Predicted % - 69    No results found for: NITRICOXIDE      Assessment & Plan:   No  problem-specific Assessment & Plan notes found for this encounter.     Rexene Edison, NP 12/22/2021

## 2021-12-24 NOTE — Assessment & Plan Note (Signed)
No longer using oxygen.  She does have a portable oxygen concentrator at home that she owns personally.  Advised her to periodically check her oxygen levels.  She remains in the 90s on room air today in office.  Plan  . Patient Instructions  Remain off Dulera  Albuterol inhaler As needed   Mucinex DM Twice daily As needed  Cough/congestion  Saline nasal spray Twice daily As needed   Zyrtec 10mg  At bedtime  As needed  Allergies  Activity as tolerated.  Chest xray today  Follow up in office in 6 months  with Dr. Shearon Stalls or Nevin Grizzle NP and As needed.  Please contact office for sooner follow up if symptoms do not improve or worsen or seek emergency care

## 2021-12-24 NOTE — Assessment & Plan Note (Signed)
Appears stable.  Patient's been making clinical improvement.  PFTs today show stability and improved DLCO.  Continue on current regimen  Plan  Patient Instructions  Remain off Dulera  Albuterol inhaler As needed   Mucinex DM Twice daily As needed  Cough/congestion  Saline nasal spray Twice daily As needed   Zyrtec 10mg  At bedtime  As needed  Allergies  Activity as tolerated.  Chest xray today  Follow up in office in 6 months  with Dr. Shearon Stalls or Rocio Roam NP and As needed.  Please contact office for sooner follow up if symptoms do not improve or worsen or seek emergency care

## 2021-12-24 NOTE — Assessment & Plan Note (Signed)
Continue activity as tolerated.  

## 2022-01-23 IMAGING — CT CT ANGIO CHEST
2 of 6 series · 17 of 46 positions shown · IV contrast (APPLIED)
Comparison: Chest CT-08/29/2020; 01/12/2018; 05/24/2017

CLINICAL DATA: [id number] infection, now with cough, fever and
shortness of breath for the past 2 days. Evaluate for pulmonary
embolism.

EXAM:
CT ANGIOGRAPHY CHEST WITH CONTRAST
TECHNIQUE: Multidetector CT imaging of the chest was performed using the
standard protocol during bolus administration of intravenous
contrast. Multiplanar CT image reconstructions and MIPs were
obtained to evaluate the vascular anatomy.
CONTRAST:  80mL OMNIPAQUE IOHEXOL 350 MG/ML SOLN

[Series 7: thins · axial · 0.64mm/px · z∈[+1193,+1458]mm · 14 of 291 slices shown]
[im 13/291  lung]
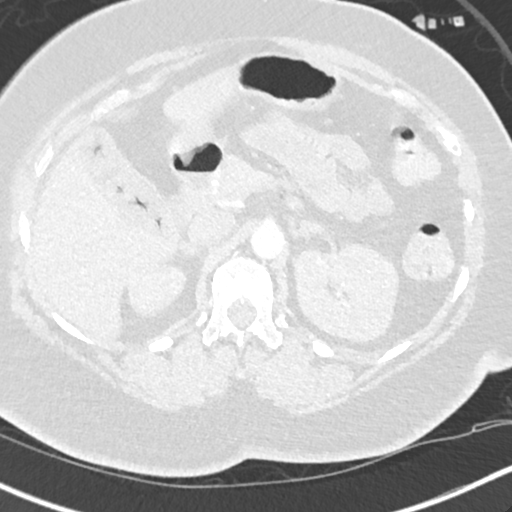
[im 38/291  soft-tissue]
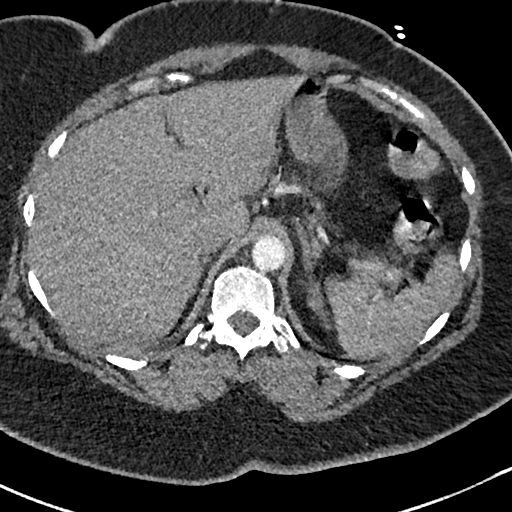
[im 51/291  lung]
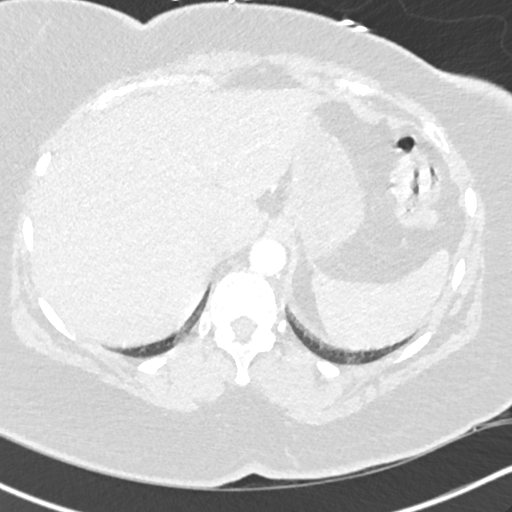
[im 76/291  soft-tissue]
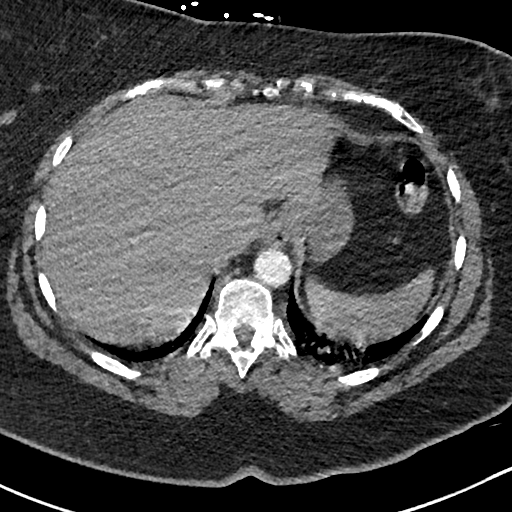
[im 101/291  lung]
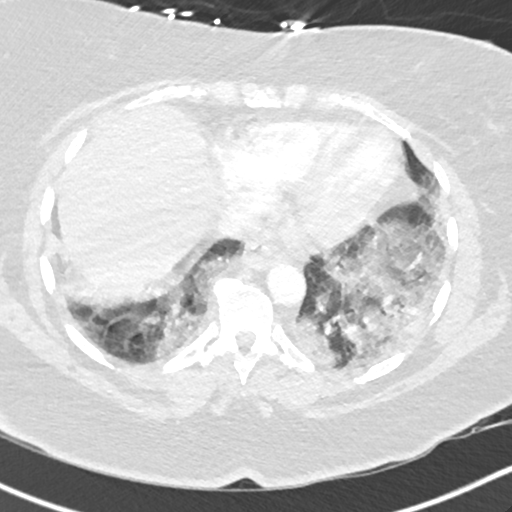
[im 114/291  soft-tissue]
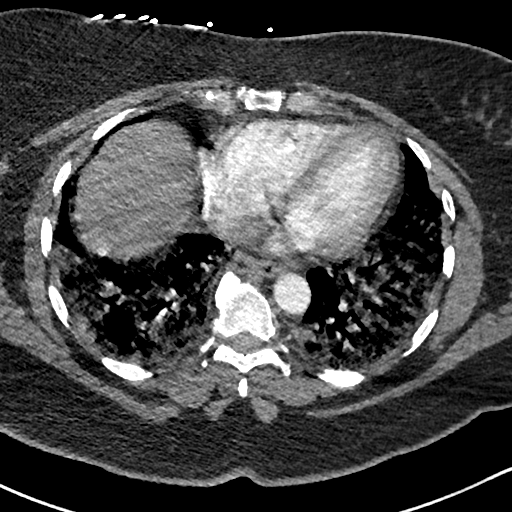
[im 139/291  lung]
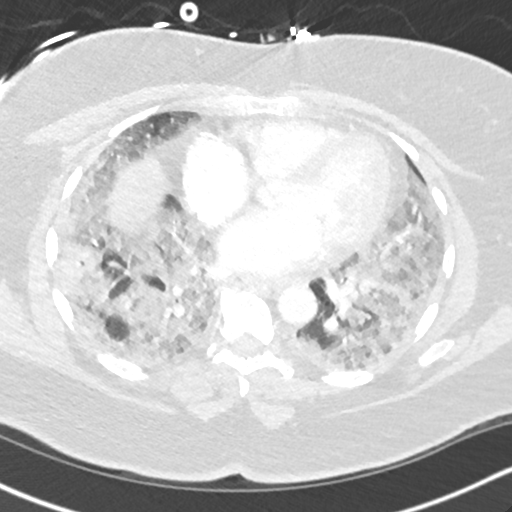
[im 152/291  soft-tissue]
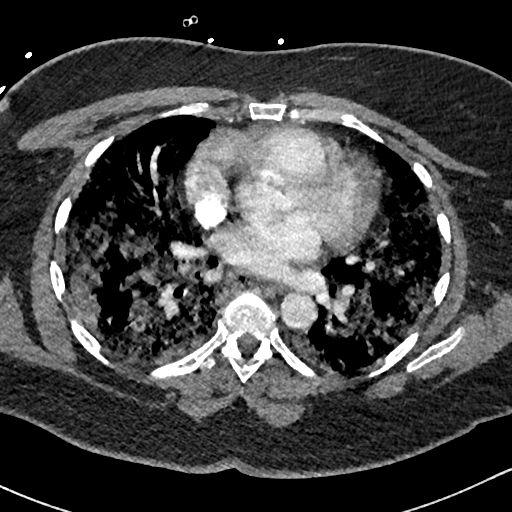
[im 177/291  lung]
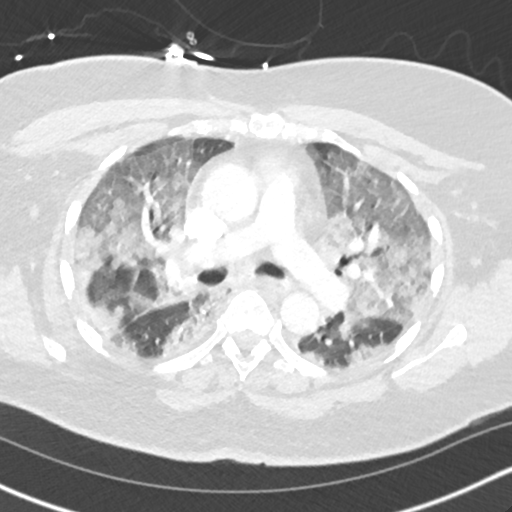
[im 190/291  soft-tissue]
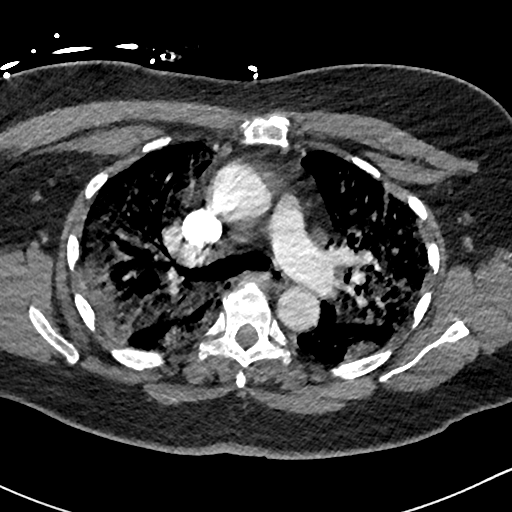
[im 215/291  lung]
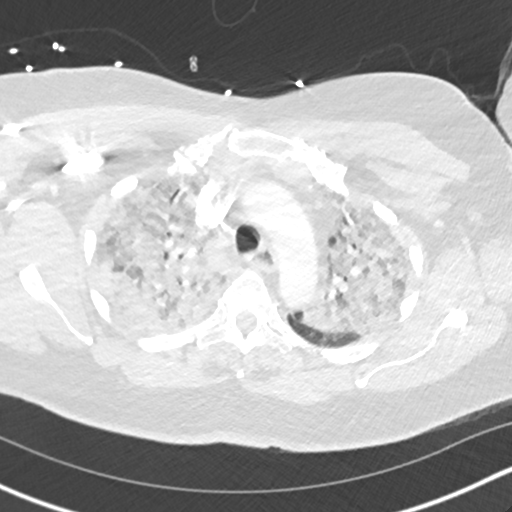
[im 240/291  soft-tissue]
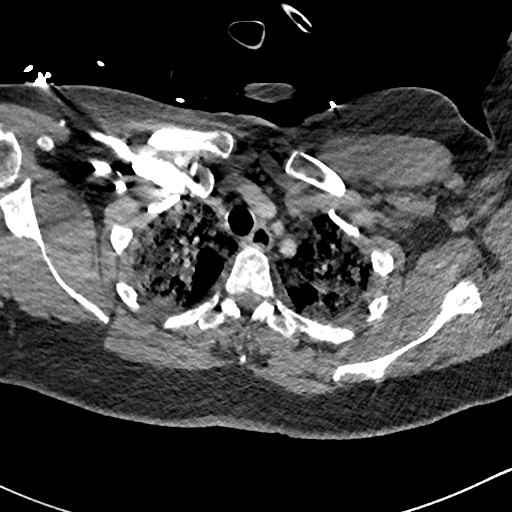
[im 253/291  lung]
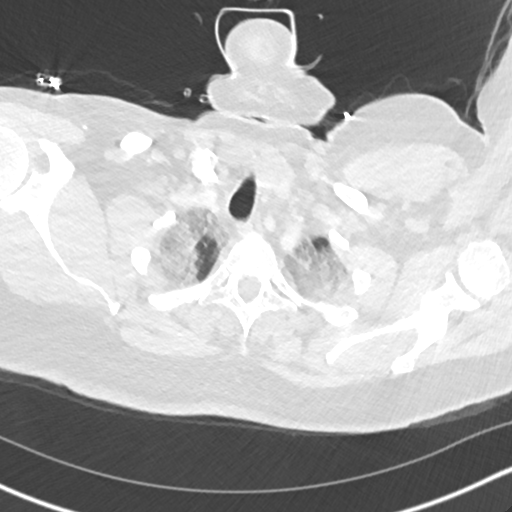
[im 278/291  soft-tissue]
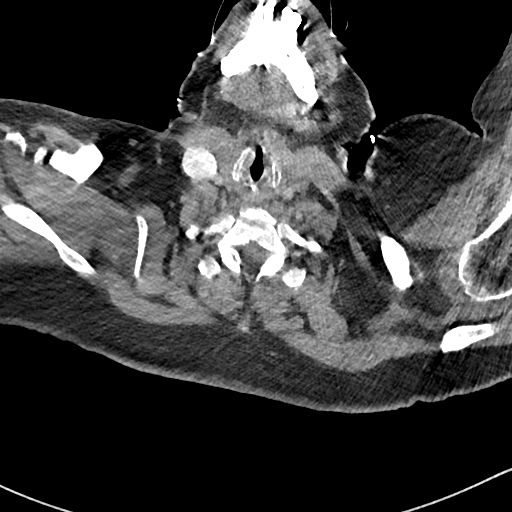

[Series 8: coronal mpr · coronal · 0.61mm/px · 3 of 118 slices shown]
[im 30/118  soft-tissue]
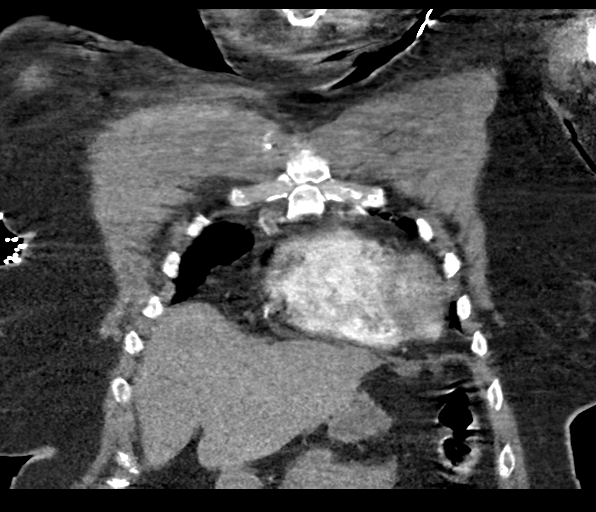
[im 59/118  soft-tissue]
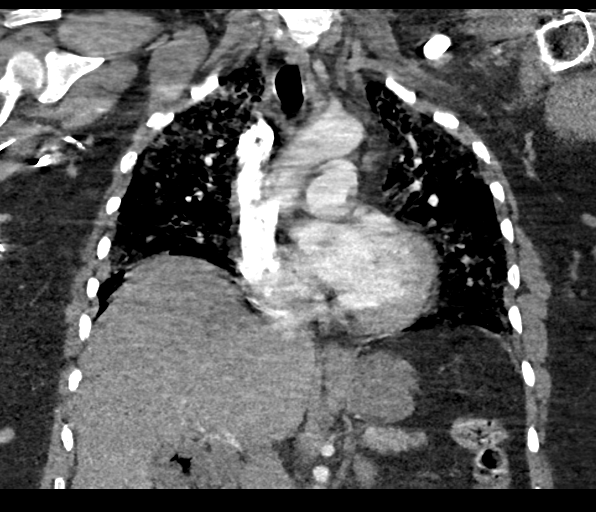
[im 88/118  soft-tissue]
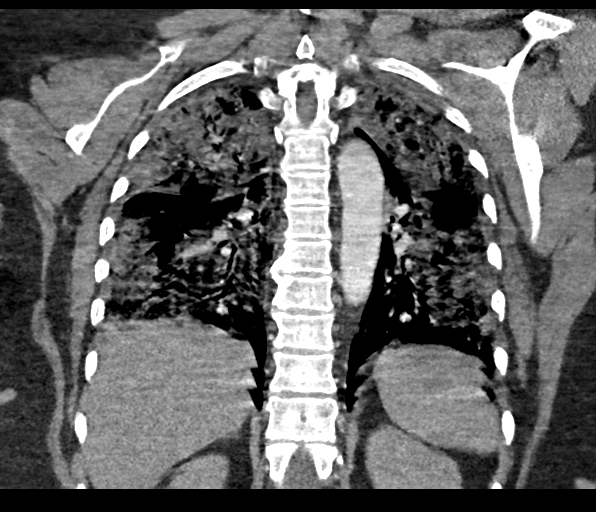

[17 of 46 positions shown; findings below may reference images not displayed]

FINDINGS: Vascular Findings:

There is suboptimal opacification of the pulmonary arterial system
with the main pulmonary artery measuring only 195 Hounsfield units.
Given this limitation, there are no discrete filling defects within
the pulmonary arterial tree to the level the bilateral subsegmental
pulmonary arteries. Evaluation of the distal subsegmental pulmonary
arteries is degraded secondary to a combination of suboptimal vessel
opacification as well as patient respiratory artifact. Normal
caliber of the main pulmonary artery.

Cardiomegaly.  No pericardial effusion.

Mild ectasia of the ascending thoracic aorta measuring 38 mm in
diameter. Conventional configuration of the aortic arch. The
descending thoracic aorta is of normal caliber and widely patent
without hemodynamically significant narrowing. There is a minimal
amount of atherosclerotic plaque involving the aortic arch and
proximal descending thoracic aorta, not resulting in hemodynamically
significant stenosis.

Review of the MIP images confirms the above findings.

----------------------------------------------------------------------------------

Nonvascular Findings:

Mediastinum/Lymph Nodes: Scattered prominent mediastinal and hilar
lymph nodes are not enlarged by size criteria with index AP window
lymph node measuring 0.9 cm in greatest short axis diameter (image
33, series 5) and index left infrahilar lymph node measuring 0.8 cm
(image 43, series 5), presumably reactive in etiology. No bulky
mediastinal, hilar or axillary lymphadenopathy.

Lungs/Pleura: Extensive bilateral heterogeneous and consolidative
airspace opacities involving near the entirety of the lungs
bilaterally with associated scattered air bronchograms. No pleural
effusion or pneumothorax. The central pulmonary airways appear
widely patent.

Upper abdomen: Limited arterial phase of the upper abdomen
demonstrates multiple cholesterol containing gallstones within an
otherwise normal-appearing gallbladder.

Musculoskeletal: No acute or aggressive osseous abnormalities.
Stigmata of dish within the thoracic spine. Regional soft tissues
appear normal. Normal appearance of the thyroid gland.
IMPRESSION: 1. No evidence of pulmonary embolism to the level the bilateral
subsegmental pulmonary arteries.
2. Extensive heterogeneous and consolidative airspace opacities
involving near the entirety of the lungs bilaterally compatible with
provided history of COVID 19 pneumonia.
3. Cardiomegaly.
4. Aortic Atherosclerosis ([id number].I).
5. Cholelithiasis.

## 2022-02-23 ENCOUNTER — Other Ambulatory Visit: Payer: Self-pay

## 2022-02-23 ENCOUNTER — Encounter (HOSPITAL_COMMUNITY): Payer: Self-pay

## 2022-02-23 ENCOUNTER — Ambulatory Visit (HOSPITAL_COMMUNITY)
Admission: EM | Admit: 2022-02-23 | Discharge: 2022-02-23 | Disposition: A | Discharge status: Home / Self Care | Payer: BC Managed Care – PPO | Attending: Family Medicine | Admitting: Family Medicine

## 2022-02-23 DIAGNOSIS — M25551 Pain in right hip: Secondary | ICD-10-CM | POA: Diagnosis not present

## 2022-02-23 LAB — POCT URINALYSIS DIPSTICK, ED / UC
Bilirubin Urine: NEGATIVE
Glucose, UA: NEGATIVE mg/dL
Hgb urine dipstick: NEGATIVE
Leukocytes,Ua: NEGATIVE
Nitrite: NEGATIVE
Protein, ur: NEGATIVE mg/dL
Specific Gravity, Urine: 1.025 (ref 1.005–1.030)
Urobilinogen, UA: 2 mg/dL — ABNORMAL HIGH (ref 0.0–1.0)
pH: 6.5 (ref 5.0–8.0)

## 2022-02-23 NOTE — ED Triage Notes (Signed)
Pt presents with c/o lower abdominal pain and hip pain. States she thought it was gas and states she now thinks she has  UTI. Denies urinary issues and states it has been going for 2 weeks.  ?

## 2022-02-23 NOTE — Discharge Instructions (Addendum)
-   The urinalysis today is negative for acute infection. ?- You can continue Tylenol for pain relief.  You can also alternate this with ibuprofen and can continue to use IcyHot and use cold or warm compresses to help with pain relief.   ?- Please continue to stay active with light physical activity.   ?- If your symptoms are not improving over the next couple weeks, follow-up with your primary care provider. ?

## 2022-02-23 NOTE — ED Provider Notes (Signed)
?[place] ? ? ? ?CSN: [id number] ?Arrival date & time: 02/23/22  1856 ? ? ?  ? ?History   ?Chief Complaint ?Chief Complaint  ?Patient presents with  ? Abdominal Pain  ? Hip Pain  ? ? ?HPI ?Wendy Ruiz is a 66 y.o. female.  ? ?Patient reports 2-week history of right hip, buttock pain.  She reports the pain is moving around to the front of her body and she is wondering if she has a urinary tract infection.  She reports the pain is also starting to move down her right leg.  She denies any recent fall, accident, or injury.  She denies numbness or tingling in her toes, incontinence of urine or stool, fevers, burning with urination, and increased urinary frequency.  She reports the pain is worse first thing in the morning when she wakes up.  She reports the pain does improve with Tylenol.  She has also tried IcyHot with mild relief of symptoms. ? ? ? ? ? ?Past Medical History:  ?Diagnosis Date  ? Bipolar 1 disorder ([place])   ? Chronic pain due to trauma   ? Obesity   ? ? ?Patient Active Problem List  ? Diagnosis Date Noted  ? GERD (gastroesophageal reflux disease) 12/29/2020  ? 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 Chronic respiratory failure with hypoxia ([place]) 10/26/2020  ? Pulmonary hypertension ([place]) 10/26/2020  ? ARDS (adult respiratory distress syndrome) ([place])   ? Morbid obesity ([place])   ? Hyperglycemia   ? Pneumonia due to COVID-19 virus 08/30/2020  ? Acute respiratory failure with hypoxia ([place])   ? Close exposure to COVID-19 virus   ? Right flank pain 05/14/2013  ? Cough 11/15/2011  ? Physical deconditioning 11/26/2009  ? OBESITY NOS 05/22/2007  ? BIPOLAR DISORDER 02/08/2007  ? ? ?History reviewed. No pertinent surgical history. ? ?OB History   ?No obstetric history on file. ?  ? ? ? ?Home Medications   ? ?Prior to Admission medications   ?Medication Sig Start Date End Date Taking? Authorizing Provider  ?albuterol (VENTOLIN HFA) 108 (90 Base) MCG/ACT inhaler INHALE 2 PUFFS INTO  THE LUNGS EVERY 6 (SIX) HOURS AS NEEDED FOR WHEEZING OR SHORTNESS OF BREATH. 07/05/21 07/05/22  Parrett, Fonnie Mu, NP  ?Ascorbic Acid (VITAMIN C) 100 MG tablet Take 100 mg by mouth daily.    [provider]  ?cholecalciferol (VITAMIN D3) 25 MCG (1000 UNIT) tablet Take 1,000 Units by mouth daily.    [provider]  ?mometasone-formoterol (DULERA) 200-5 MCG/ACT AERO Inhale 2 puffs into the lungs 2 (two) times daily. 07/05/21   Parrett, Fonnie Mu, NP  ?Multiple Vitamins-Minerals (MULTIVITAMIN WITH MINERALS) tablet Take 1 tablet by mouth daily.    [provider]  ?potassium chloride SA (KLOR-CON) 20 MEQ tablet TAKE 1 TABLET (20 MEQ) BY MOUTH DAILY FOR 7 DAYS. ?Patient not taking: Reported on 07/05/2021 09/29/20 09/29/21  Bonnielee Haff, MD  ?Zinc Sulfate (ZINC-220 PO) Take by mouth.    [provider]  ?furosemide (LASIX) 20 MG tablet 1 daily for 5 days prn leg swelling ?Patient not taking: Reported on 12/29/2020 12/24/20 12/29/20  Argentina Donovan, PA-C  ?metoprolol tartrate (LOPRESSOR) 25 MG tablet Take 0.5 tablets (12.5 mg total) by mouth 2 (two) times daily. ?Patient not taking: Reported on 10/14/2020 09/29/20 10/14/20  Bonnielee Haff, MD  ?pantoprazole (PROTONIX) 40 MG tablet Take 1 tablet (40 mg total) by mouth daily at 12 noon for 21 days. 09/30/20 12/29/20  Bonnielee Haff, MD  ? ? ?  Family History ?History reviewed. No pertinent family history. ? ?Social History ?Social History  ? ?Tobacco Use  ? Smoking status: Former  ?  Types: Cigarettes  ?  Start date: 93  ?  Quit date: 73  ?  Years since quitting: 43.2  ? Smokeless tobacco: Never  ?Substance Use Topics  ? Alcohol use: No  ? Drug use: No  ? ? ? ?Allergies   ?Penicillins ? ? ?Review of Systems ?Review of Systems ?Per HPI ? ?Physical Exam ?Triage Vital Signs ?ED Triage Vitals  ?Enc Vitals Group  ?   BP 02/23/22 1939 124/81  ?   Pulse Rate 02/23/22 1939 92  ?   Resp 02/23/22 1939 15  ?   Temp --   ?   Temp src --   ?   SpO2  02/23/22 1939 93 %  ?   Weight --   ?   Height --   ?   Head Circumference --   ?   Peak Flow --   ?   Pain Score 02/23/22 1937 8  ?   Pain Loc --   ?   Pain Edu? --   ?   Excl. in [place]? --   ? ?No data found. ? ?Updated Vital Signs ?BP 124/81 (BP Location: Right Arm)   Pulse 92   Resp 15   SpO2 93%  ? ?Visual Acuity ?Right Eye Distance:   ?Left Eye Distance:   ?Bilateral Distance:   ? ?Right Eye Near:   ?Left Eye Near:    ?Bilateral Near:    ? ?Physical Exam ?Vitals and nursing note reviewed.  ?Constitutional:   ?   General: She is not in acute distress. ?   Appearance: Normal appearance. She is not toxic-appearing.  ?Musculoskeletal:     ?   General: Tenderness present.  ?   Right hip: Tenderness present. No bony tenderness. Normal range of motion.  ?   Right upper leg: Tenderness present. No bony tenderness.  ?   Right lower leg: No edema.  ?   Left lower leg: No edema.  ?     Legs: ? ?   Comments: Patient describes pain in areas marked.  She is nontender to palpation there is no overt swelling or bony tenderness.  ?Skin: ?   General: Skin is warm and dry.  ?   Capillary Refill: Capillary refill takes less than 2 seconds.  ?   Coloration: Skin is not jaundiced or pale.  ?   Findings: No erythema.  ?Neurological:  ?   Mental Status: She is alert and oriented to person, place, and time.  ?   Motor: No weakness.  ?   Gait: Gait abnormal (Walking with a slight limp).  ?Psychiatric:     ?   Mood and Affect: Mood normal.     ?   Behavior: Behavior normal.  ? ? ? ?UC Treatments / Results  ?Labs ?(all labs ordered are listed, but only abnormal results are displayed) ?Labs Reviewed  ?POCT URINALYSIS DIPSTICK, ED / UC - Abnormal; Notable for the following components:  ?    Result Value  ? Ketones, ur TRACE (*)   ? Urobilinogen, UA 2.0 (*)   ? All other components within normal limits  ?POCT URINALYSIS DIPSTICK, ED / UC  ? ? ?EKG ? ? ?Radiology ?No results found. ? ?Procedures ?Procedures (including critical care  time) ? ?Medications Ordered in UC ?Medications - No data to display ? ?Initial  Impression / Assessment and Plan / UC Course  ?I have reviewed the triage vital signs and the nursing notes. ? ?Pertinent labs & imaging results that were available during my care of the patient were reviewed by me and considered in my medical decision making (see chart for details). ? ?  ?Suspect muscular cause for pain she is experiencing.  Urinalysis today is not indicative of a urinary tract infection.  No recent fall or injury-hip joint is not tender, therefore bursitis seems unlikely.  Encouraged alternating Tylenol and ibuprofen for pain relief.  She declines muscle relaxant today.  Also encouraged heat and/or ice to help with pain.  Encourage staying active with light physical activity.  Follow-up with primary care provider if symptoms do not gradually improve over the next couple of weeks. ?Final Clinical Impressions(s) / UC Diagnoses  ? ?Final diagnoses:  ?Right hip pain  ? ? ? ?Discharge Instructions   ? ?  ?- The urinalysis today is negative for acute infection. ?- You can continue Tylenol for pain relief.  You can also alternate this with ibuprofen and can continue to use IcyHot and use cold or warm compresses to help with pain relief.   ?- Please continue to stay active with light physical activity.   ?- If your symptoms are not improving over the next couple weeks, follow-up with your primary care provider. ? ? ? ? ?ED Prescriptions   ?None ?  ? ?PDMP not reviewed this encounter. ?  ?Eulogio Bear, NP ?02/23/22 2044 ? ?

## 2022-07-15 ENCOUNTER — Ambulatory Visit
Admission: RE | Admit: 2022-07-15 | Discharge: 2022-07-15 | Disposition: A | Discharge status: Home / Self Care | Payer: BC Managed Care – PPO | Source: Ambulatory Visit | Attending: Physician Assistant | Admitting: Physician Assistant

## 2022-07-15 ENCOUNTER — Other Ambulatory Visit: Payer: Self-pay | Admitting: Physician Assistant

## 2022-07-15 DIAGNOSIS — M5441 Lumbago with sciatica, right side: Secondary | ICD-10-CM

## 2024-02-02 ENCOUNTER — Encounter: Payer: Self-pay | Admitting: Internal Medicine

## 2024-07-20 ENCOUNTER — Emergency Department (HOSPITAL_BASED_OUTPATIENT_CLINIC_OR_DEPARTMENT_OTHER)
Admission: EM | Admit: 2024-07-20 | Discharge: 2024-07-20 | Discharge status: Against Medical Advice | Attending: Emergency Medicine | Admitting: Emergency Medicine

## 2024-07-20 ENCOUNTER — Encounter (HOSPITAL_COMMUNITY): Payer: Self-pay

## 2024-07-20 ENCOUNTER — Encounter (HOSPITAL_BASED_OUTPATIENT_CLINIC_OR_DEPARTMENT_OTHER): Payer: Self-pay | Admitting: Emergency Medicine

## 2024-07-20 ENCOUNTER — Ambulatory Visit (HOSPITAL_COMMUNITY)
Admission: EM | Admit: 2024-07-20 | Discharge: 2024-07-20 | Disposition: A | Discharge status: Home / Self Care | Attending: Family Medicine | Admitting: Family Medicine

## 2024-07-20 DIAGNOSIS — Z041 Encounter for examination and observation following transport accident: Secondary | ICD-10-CM | POA: Insufficient documentation

## 2024-07-20 DIAGNOSIS — Z5321 Procedure and treatment not carried out due to patient leaving prior to being seen by health care provider: Secondary | ICD-10-CM | POA: Insufficient documentation

## 2024-07-20 DIAGNOSIS — Y92410 Unspecified street and highway as the place of occurrence of the external cause: Secondary | ICD-10-CM | POA: Diagnosis not present

## 2024-07-20 DIAGNOSIS — R42 Dizziness and giddiness: Secondary | ICD-10-CM

## 2024-07-20 DIAGNOSIS — S0990XA Unspecified injury of head, initial encounter: Secondary | ICD-10-CM | POA: Diagnosis not present

## 2024-07-20 NOTE — Discharge Instructions (Signed)
 Follow up with your primary care doctor or here within 48-72 hours. Do your best to ensure adequate rest. If not allergic, take acetaminophen (Tylenol) every 4-6 hours as needed for discomfort. Often individuals will develop a headache associated with mild nausea in the days or hours after a head injury. This is called a concussion and may require further follow up. ? ?Please seek prompt medical care if: ?You have: ?A very bad (severe) headache that is not helped by medicine. ?Trouble walking or weakness in your arms and legs. ?Clear or bloody fluid coming from your nose or ears. ?Changes in your seeing (vision). ?Jerky movements that you cannot control (seizure). ?You throw up (vomit). ?Your symptoms get worse. ?You lose balance. ?Your speech is slurred. ?You pass out. ?You are sleepier and have trouble staying awake. ?The black centers of your eyes (pupils) change in size. ? ?These symptoms may be an emergency. Do not wait to see if the symptoms will go away. Get medical help right away. Call your local emergency services. Do not drive yourself to the hospital. ? ?
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
## 2024-07-20 NOTE — ED Provider Notes (Signed)
 This patient eloped from the emergency department prior to evaluation completion.   Wendy Ruiz, [name] 07/20/24 7646    Doretha Folks, MD 07/24/24 [phone]

## 2024-07-20 NOTE — ED Notes (Signed)
 Patient moved to room 2 from lobby
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
## 2024-07-23 ENCOUNTER — Encounter (HOSPITAL_COMMUNITY): Payer: Self-pay

## 2024-07-23 ENCOUNTER — Other Ambulatory Visit (HOSPITAL_BASED_OUTPATIENT_CLINIC_OR_DEPARTMENT_OTHER): Payer: Self-pay | Admitting: Internal Medicine

## 2024-07-23 ENCOUNTER — Ambulatory Visit (HOSPITAL_COMMUNITY)
Admission: RE | Admit: 2024-07-23 | Discharge: 2024-07-23 | Disposition: A | Discharge status: Home / Self Care | Source: Ambulatory Visit | Attending: Internal Medicine | Admitting: Internal Medicine

## 2024-07-23 DIAGNOSIS — R519 Headache, unspecified: Secondary | ICD-10-CM

## 2024-07-23 DIAGNOSIS — S0990XD Unspecified injury of head, subsequent encounter: Secondary | ICD-10-CM
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
## 2025-03-03 ENCOUNTER — Ambulatory Visit: Admitting: Dermatology
